# Patient Record
Sex: Male | Born: 1965 | Race: White | Hispanic: No | Marital: Married | Smoking: Former smoker
Health system: Southern US, Community
[De-identification: ages and names within clinical notes are randomized; demographics above are authoritative.]

## PROBLEM LIST (undated history)

## (undated) DIAGNOSIS — E7800 Pure hypercholesterolemia, unspecified: Secondary | ICD-10-CM

## (undated) DIAGNOSIS — E291 Testicular hypofunction: Secondary | ICD-10-CM

## (undated) DIAGNOSIS — M1990 Unspecified osteoarthritis, unspecified site: Secondary | ICD-10-CM

## (undated) DIAGNOSIS — Z86010 Personal history of colonic polyps: Principal | ICD-10-CM

## (undated) DIAGNOSIS — G709 Myoneural disorder, unspecified: Secondary | ICD-10-CM

## (undated) DIAGNOSIS — R7309 Other abnormal glucose: Secondary | ICD-10-CM

## (undated) DIAGNOSIS — G4730 Sleep apnea, unspecified: Secondary | ICD-10-CM

## (undated) DIAGNOSIS — I10 Essential (primary) hypertension: Secondary | ICD-10-CM

## (undated) DIAGNOSIS — E559 Vitamin D deficiency, unspecified: Secondary | ICD-10-CM

## (undated) DIAGNOSIS — L0390 Cellulitis, unspecified: Secondary | ICD-10-CM

## (undated) DIAGNOSIS — E785 Hyperlipidemia, unspecified: Secondary | ICD-10-CM

## (undated) DIAGNOSIS — T7840XA Allergy, unspecified, initial encounter: Secondary | ICD-10-CM

## (undated) HISTORY — DX: Hyperlipidemia, unspecified: E78.5

## (undated) HISTORY — DX: Cellulitis, unspecified: L03.90

## (undated) HISTORY — DX: Essential (primary) hypertension: I10

## (undated) HISTORY — DX: Personal history of colonic polyps: Z86.010

## (undated) HISTORY — DX: Vitamin D deficiency, unspecified: E55.9

## (undated) HISTORY — DX: Pure hypercholesterolemia, unspecified: E78.00

## (undated) HISTORY — DX: Unspecified osteoarthritis, unspecified site: M19.90

## (undated) HISTORY — DX: Testicular hypofunction: E29.1

## (undated) HISTORY — DX: Allergy, unspecified, initial encounter: T78.40XA

## (undated) HISTORY — DX: Other abnormal glucose: R73.09

## (undated) HISTORY — PX: OTHER SURGICAL HISTORY: SHX169

## (undated) HISTORY — PX: COLONOSCOPY: SHX174

## (undated) HISTORY — PX: POLYPECTOMY: SHX149

## (undated) HISTORY — DX: Myoneural disorder, unspecified: G70.9

## (undated) HISTORY — DX: Sleep apnea, unspecified: G47.30

---

## 1984-09-20 HISTORY — PX: HIP FRACTURE SURGERY: SHX118

## 2000-07-29 ENCOUNTER — Encounter: Payer: Self-pay | Admitting: Occupational Medicine

## 2000-07-29 ENCOUNTER — Encounter: Admission: RE | Admit: 2000-07-29 | Discharge: 2000-07-29 | Payer: Self-pay | Admitting: Occupational Medicine

## 2000-08-10 ENCOUNTER — Encounter: Admission: RE | Admit: 2000-08-10 | Discharge: 2000-08-10 | Payer: Self-pay | Admitting: Occupational Medicine

## 2000-08-10 ENCOUNTER — Encounter: Payer: Self-pay | Admitting: Occupational Medicine

## 2009-04-04 ENCOUNTER — Ambulatory Visit (HOSPITAL_COMMUNITY): Admission: RE | Admit: 2009-04-04 | Discharge: 2009-04-04 | Payer: Self-pay | Admitting: Internal Medicine

## 2011-08-30 ENCOUNTER — Ambulatory Visit
Admission: RE | Admit: 2011-08-30 | Discharge: 2011-08-30 | Disposition: A | Discharge status: Home / Self Care | Payer: Worker's Compensation | Source: Ambulatory Visit | Attending: Neurology | Admitting: Neurology

## 2011-08-30 ENCOUNTER — Other Ambulatory Visit: Payer: Self-pay | Admitting: Neurology

## 2011-08-30 ENCOUNTER — Ambulatory Visit
Admission: RE | Admit: 2011-08-30 | Discharge: 2011-08-30 | Disposition: A | Discharge status: Home / Self Care | Payer: Self-pay | Source: Ambulatory Visit | Attending: Neurology | Admitting: Neurology

## 2011-08-30 DIAGNOSIS — M542 Cervicalgia: Secondary | ICD-10-CM

## 2012-03-03 ENCOUNTER — Other Ambulatory Visit: Payer: Self-pay | Admitting: Neurology

## 2012-03-03 DIAGNOSIS — M542 Cervicalgia: Secondary | ICD-10-CM

## 2012-03-15 ENCOUNTER — Ambulatory Visit
Admission: RE | Admit: 2012-03-15 | Discharge: 2012-03-15 | Disposition: A | Discharge status: Home / Self Care | Payer: Worker's Compensation | Source: Ambulatory Visit | Attending: Neurology | Admitting: Neurology

## 2012-03-15 ENCOUNTER — Other Ambulatory Visit: Payer: Self-pay | Admitting: Neurology

## 2012-03-15 DIAGNOSIS — M542 Cervicalgia: Secondary | ICD-10-CM

## 2012-03-15 MED ORDER — DEXAMETHASONE SODIUM PHOSPHATE 4 MG/ML IJ SOLN
8.0000 mg | Freq: Once | INTRAMUSCULAR | Status: AC
  Administered 2012-03-15: 8 mg via INTRA_ARTICULAR

## 2012-03-15 NOTE — Discharge Instructions (Signed)

## 2012-07-18 ENCOUNTER — Other Ambulatory Visit: Payer: Self-pay | Admitting: Neurology

## 2012-07-18 DIAGNOSIS — M542 Cervicalgia: Secondary | ICD-10-CM

## 2012-08-01 ENCOUNTER — Other Ambulatory Visit: Payer: Self-pay | Admitting: Neurology

## 2012-08-01 ENCOUNTER — Ambulatory Visit
Admission: RE | Admit: 2012-08-01 | Discharge: 2012-08-01 | Disposition: A | Discharge status: Home / Self Care | Payer: Worker's Compensation | Source: Ambulatory Visit | Attending: Neurology | Admitting: Neurology

## 2012-08-01 DIAGNOSIS — M542 Cervicalgia: Secondary | ICD-10-CM

## 2012-08-01 MED ORDER — DEXAMETHASONE SODIUM PHOSPHATE 4 MG/ML IJ SOLN
4.0000 mg | Freq: Once | INTRAMUSCULAR | Status: AC
  Administered 2012-08-01: 4 mg via INTRA_ARTICULAR

## 2012-08-01 MED ORDER — IOHEXOL 300 MG/ML  SOLN
1.0000 mL | Freq: Once | INTRAMUSCULAR | Status: AC | PRN
  Administered 2012-08-01: 1 mL via INTRA_ARTICULAR

## 2013-07-31 ENCOUNTER — Other Ambulatory Visit: Payer: Self-pay | Admitting: Internal Medicine

## 2013-08-13 ENCOUNTER — Encounter: Payer: Self-pay | Admitting: Physician Assistant

## 2013-08-13 ENCOUNTER — Ambulatory Visit: Payer: BC Managed Care – PPO | Admitting: Physician Assistant

## 2013-08-13 VITALS — BP 138/88 | HR 72 | Temp 98.6°F | Resp 16 | Wt 275.0 lb

## 2013-08-13 DIAGNOSIS — M25562 Pain in left knee: Secondary | ICD-10-CM

## 2013-08-13 MED ORDER — PREDNISONE 20 MG PO TABS: ORAL_TABLET | ORAL | Status: DC

## 2013-08-13 NOTE — Progress Notes (Signed)
  Subjective:    Patient ID: Donald Schwartz, male    DOB: [birth year], 47 y.o.   MRN: [id number]  Was going down the stairs at a business in [place], missed the step and thinks he hyperextended his left knee and pulled something in his thigh.  Leg Pain  The injury mechanism was a fall. The pain is present in the left leg and left thigh. The quality of the pain is described as aching. Pertinent negatives include no inability to bear weight, loss of motion, loss of sensation, muscle weakness, numbness or tingling. He reports no foreign bodies present. The symptoms are aggravated by movement and weight bearing. He has tried NSAIDs for the symptoms. The treatment provided no relief.    No past medical history on file. Current Outpatient Prescriptions on File Prior to Visit  Medication Sig Dispense Refill  . testosterone cypionate (DEPOTESTOTERONE CYPIONATE) 200 MG/ML injection INJECT TWO MLS (CC) INTRAMUSCULARLY EVERY 2 WEEKS  10 mL  1   No current facility-administered medications on file prior to visit.    Review of Systems  Constitutional: Negative.   HENT: Negative.   Eyes: Negative.   Respiratory: Negative.   Cardiovascular: Negative.   Gastrointestinal: Negative.   Genitourinary: Negative.   Musculoskeletal: Positive for arthralgias, gait problem and myalgias.  Skin: Negative.   Neurological: Negative for tingling and numbness.       Objective:   Physical Exam  Musculoskeletal:       Left knee: He exhibits effusion and abnormal patellar mobility. He exhibits no ecchymosis, no erythema, no LCL laxity, normal meniscus and no MCL laxity. Tenderness found. Patellar tendon tenderness noted. No medial joint line, no lateral joint line, no MCL and no LCL tenderness noted.  +minimal effusion left knee, swelling and tenderness on medial left quads at knee. Pain with flexion and extension of his knee, no laxity in MCL, ACL, Or LCL.        Assessment & Plan:  1. Pain in joint,  lower leg, left- tendoitis versus rupture/tear of medial quad Prednisone 20 #20 - Ambulatory referral to Orthopedic Surgery- able to see him today

## 2013-08-13 NOTE — Patient Instructions (Signed)
Tendon Injury °Tendons are strong, cordlike structures that connect muscle to bone. Tendons are made up of woven fibers, like a rope. A tendon injury is a tear (rupture) of the tendon. The rupture may be partial (only a few of the fibers in your tendon rupture) or complete (your entire tendon ruptures). °CAUSES  °Tendon injuries can be caused by high-stress activities, such as sports. They also can be caused by a repetitive injury or by a single injury from an excessive, rapid force. °SYMPTOMS  °Symptoms of tendon injury include pain when you move the joint close to the tendon. Other symptoms are swelling, redness, and warmth. °DIAGNOSIS  °Tendon injuries often can be diagnosed by physical exam. However, sometimes an X-ray exam or advanced imaging, such as magnetic resonance imaging (MRI), is necessary to determine the extent of the injury. °TREATMENT  °Partial tendon ruptures often can be treated with immobilization. A splint, bandage, or removable brace usually is used to immobilize the injured tendon. Most injured tendons need to be immobilized for 1 2 months before they are completely healed. Complete tendon ruptures may require surgical reattachment. °Document Released: 10/14/2004 Document Revised: 08/26/2011 Document Reviewed: 11/28/2011 °[employer]® Patient Information ©2014 [employer]. ° °

## 2013-09-23 ENCOUNTER — Other Ambulatory Visit: Payer: Self-pay | Admitting: Internal Medicine

## 2013-10-14 ENCOUNTER — Encounter: Payer: Self-pay | Admitting: *Deleted

## 2013-10-18 ENCOUNTER — Encounter: Payer: Self-pay | Admitting: Emergency Medicine

## 2013-10-18 ENCOUNTER — Ambulatory Visit (INDEPENDENT_AMBULATORY_CARE_PROVIDER_SITE_OTHER): Payer: BC Managed Care – PPO | Admitting: Emergency Medicine

## 2013-10-18 VITALS — BP 138/86 | HR 64 | Temp 98.2°F | Resp 18 | Ht 73.5 in | Wt 268.0 lb

## 2013-10-18 DIAGNOSIS — I10 Essential (primary) hypertension: Secondary | ICD-10-CM

## 2013-10-18 DIAGNOSIS — E7800 Pure hypercholesterolemia, unspecified: Secondary | ICD-10-CM

## 2013-10-18 DIAGNOSIS — R7309 Other abnormal glucose: Secondary | ICD-10-CM

## 2013-10-18 DIAGNOSIS — E291 Testicular hypofunction: Secondary | ICD-10-CM

## 2013-10-18 DIAGNOSIS — E782 Mixed hyperlipidemia: Secondary | ICD-10-CM

## 2013-10-18 DIAGNOSIS — R1031 Right lower quadrant pain: Secondary | ICD-10-CM

## 2013-10-18 NOTE — Patient Instructions (Signed)
Kidney Stones Kidney stones (urolithiasis) are solid masses that form inside your kidneys. The intense pain is caused by the stone moving through the kidney, ureter, bladder, and urethra (urinary tract). When the stone moves, the ureter starts to spasm around the stone. The stone is usually passed in your pee (urine).  HOME CARE  Drink enough fluids to keep your pee clear or pale yellow. This helps to get the stone out.  Strain all pee through the provided strainer. Do not pee without peeing through the strainer, not even once. If you pee the stone out, catch it in the strainer. The stone may be as small as a grain of salt. Take this to your doctor. This will help your doctor figure out what you can do to try to prevent more kidney stones.  Only take medicine as told by your doctor.  Follow up with your doctor as told.  Get follow-up X-rays as told by your doctor. GET HELP IF: You have pain that gets worse even if you have been taking pain medicine. GET HELP RIGHT AWAY IF:   Your pain does not get better with medicine.  You have a fever or shaking chills.  Your pain increases and gets worse over 18 hours.  You have new belly (abdominal) pain.  You feel faint or pass out.  You are unable to pee. MAKE SURE YOU:   Understand these instructions.  Will watch your condition.  Will get help right away if you are not doing well or get worse. Document Released: 02/23/2008 Document Revised: 05/09/2013 Document Reviewed: 02/07/2013 [facility] Patient Information 2014 [place], [place]. Diverticulitis Small pockets or "bubbles" can develop in the wall of the intestine. Diverticulitis is when those pockets become infected and inflamed. This causes stomach pain (usually on the left side). HOME CARE  Take all medicine as told by your doctor.  Try a clear liquid diet (broth, tea, or water) for as long as told by your doctor.  Keep all follow-up visits with your doctor.  You may be put on a  low-fiber diet once you start feeling better. Here are foods that have low-fiber:  White breads, cereals, rice, and pasta.  Cooked fruits and vegetables or soft fresh fruits and vegetables without the skin.  Ground or well-cooked tender beef, ham, veal, lamb, pork, or poultry.  Eggs and seafood.  After you are doing well on the low-fiber diet, you may be put on a high-fiber diet. Here are ways to increase your fiber:  Choose whole-grain breads, cereals, pasta, and brown rice.  Choose fruits and vegetables with skin on. Do not overcook the vegetables.  Choose nuts, seeds, legumes, dried peas, beans, and lentils.  Look for food products that have more than 3 grams of fiber per serving on the food label. GET HELP RIGHT AWAY IF:  Your pain does not get better or gets worse.  You have trouble eating food.  You are not pooping (having bowel movements) like normal.  You have a temperature by mouth above 102 F (38.9 C), not controlled by medicine.  You keep throwing up (vomiting).  You have bloody or black, tarry poop (stools).  You are getting worse and not better. MAKE SURE YOU:   Understand these instructions.  Will watch your condition.  Will get help right away if you are not doing well or get worse. Document Released: 02/23/2008 Document Revised: 11/29/2011 Document Reviewed: 07/28/2009 [facility] Patient Information 2014 [place], [place]. Appendicitis Appendicitis means the appendix is puffy (inflamed).  You need to get medical help right away. Without help, your problems can get much worse. The appendix can develop a hole (perforation). A pocket of yellowish-white fluid (abscess) can leak from the appendix. This can make you very sick. SYMPTOMS   Pain around the belly button (navel). The pain later moves toward the lower right belly (abdomen). The pain may be strong and Fix.  Tenderness in the lower right belly. The pain feels worse if you cough or move  suddenly.  Feeling sick to your stomach (nausea).  Throwing up (vomiting).  No desire to eat (loss of appetite).  Fever.  Having a hard time pooping (constipation).  Watery poop (diarrhea).  Generally not feeling well. TREATMENT  In most cases, surgery is done to take out the appendix. This is done as soon as possible. This surgery is called appendectomy. Most people go home in 24 to 48 hours after surgery. If the appendix has a hole, surgery might be delayed. Any yellowish-white fluid will be removed with a drain. A drain removes fluid from the body. You may be given antibiotic medicine that kills germs. This medicine is given through a tube in your vein (IV). You may still need surgery after the fluid has been drained. You may need to stay in the hospital longer than 48 hours. Document Released: 11/29/2011 Document Reviewed: 11/29/2011 [facility] Patient Information 2014 [place].

## 2013-10-18 NOTE — Progress Notes (Signed)
Subjective:    Patient ID: Donald Schwartz, male    DOB: Jun 03, 1966, 48 y.o.   MRN: [id number]  HPI Comments: 48 yo male presents for 3 month F/U for HTN, Cholesterol, Pre-Dm, D. Deficient. He notes BP 120-30/ 80s LAST LABS T 140 TG 111 H 33 L 85 A1C 5.8 D90 INSULIN 21 He has not been exercising routinely but keeps busy. He eats descent.   He has been having low abdomen pain mostly on right side on/ off over the last month. He notes he thinks he has polyps and diverticulitis per BE several years ago. He denies any triggers for pain and notes resolves on it's own. He denies any bowel or urine changes. He notes FHX colon cancer in MGF/ MU x 2  Hypertension   Current Outpatient Prescriptions on File Prior to Visit  Medication Sig Dispense Refill  . aspirin 81 MG chewable tablet Chew by mouth daily.      Marland Kitchen diltiazem (DILACOR XR) 120 MG 24 hr capsule Take 120 mg by mouth daily.      . fluticasone (FLONASE) 50 MCG/ACT nasal spray USE ONE SPRAY EACH NOSTRIL TWICE DAILY  16 g  3  . isometheptene-acetaminophen-dichloralphenazone (MIDRIN) 65-325-100 MG capsule Take 1 capsule by mouth 4 (four) times daily as needed for migraine. Maximum 5 capsules in 12 hours for migraine headaches, 8 capsules in 24 hours for tension headaches.      Marland Kitchen lisinopril (PRINIVIL,ZESTRIL) 10 MG tablet Take 10 mg by mouth daily.      . pregabalin (LYRICA) 100 MG capsule Take 100 mg by mouth 3 (three) times daily.      . Red Yeast Rice Extract (RED YEAST RICE PO) Take 1,200 mg by mouth 2 (two) times daily.       Marland Kitchen testosterone cypionate (DEPOTESTOTERONE CYPIONATE) 200 MG/ML injection INJECT TWO MLS (CC) INTRAMUSCULARLY EVERY 2 WEEKS  10 mL  1  . VITAMIN D, ERGOCALCIFEROL, PO Take 5,000 Int'l Units/day by mouth.       No current facility-administered medications on file prior to visit.   ALLERGIES Toprol xl  Past Medical History  Diagnosis Date  . Hypertension   . Vitamin D deficiency   . Hypogonadism male   . Elevated  LDL cholesterol level   . Elevated hemoglobin A1c        Review of Systems  Gastrointestinal: Positive for abdominal pain.  All other systems reviewed and are negative.   BP 138/86  Pulse 64  Temp(Src) 98.2 F (36.8 C) (Temporal)  Resp 18  Ht 6' 1.5" (1.867 m)  Wt 268 lb (121.564 kg)  BMI 34.88 kg/m2     Objective:   Physical Exam  Nursing note and vitals reviewed. Constitutional: He is oriented to person, place, and time. He appears well-developed and well-nourished.  HENT:  Head: Normocephalic and atraumatic.  Right Ear: External ear normal.  Left Ear: External ear normal.  Nose: Nose normal.  Eyes: Conjunctivae and EOM are normal.  Neck: Normal range of motion. Neck supple. No JVD present. No thyromegaly present.  Cardiovascular: Normal rate, regular rhythm, normal heart sounds and intact distal pulses.   Pulmonary/Chest: Effort normal and breath sounds normal.  Abdominal: Soft. Bowel sounds are normal. He exhibits no distension and no mass. There is no tenderness. There is no rebound and no guarding.  Musculoskeletal: Normal range of motion. He exhibits no edema and no tenderness.  Lymphadenopathy:    He has no cervical adenopathy.  Neurological: He is  alert and oriented to person, place, and time. He has normal reflexes. No cranial nerve deficit. Coordination normal.  Skin: Skin is warm and dry.  Psychiatric: He has a normal mood and affect. His behavior is normal. Judgment and thought content normal.          Assessment & Plan:  1.  3 month F/U for HTN, Cholesterol, Pre-Dm, D. Deficient. Needs healthy diet, cardio QD and obtain healthy weight. Check Labs, Check BP if >130/80 call office 2. Abdomen pain recently- ? Diverticulitis vs kidney stone- Check labs, w/c if SX increase or ER. If symptoms continue may need colonoscopy early with FMhx Colon Cancer

## 2013-10-19 LAB — BASIC METABOLIC PANEL WITHOUT GFR
BUN: 10 mg/dL (ref 6–23)
CO2: 26 meq/L (ref 19–32)
Calcium: 9.5 mg/dL (ref 8.4–10.5)
Chloride: 99 meq/L (ref 96–112)
Creat: 1.13 mg/dL (ref 0.50–1.35)
GFR, Est African American: 88 mL/min
GFR, Est Non African American: 76 mL/min
Glucose, Bld: 91 mg/dL (ref 70–99)
Potassium: 3.9 meq/L (ref 3.5–5.3)
Sodium: 135 meq/L (ref 135–145)

## 2013-10-19 LAB — HEPATIC FUNCTION PANEL
ALT: 32 U/L (ref 0–53)
AST: 36 U/L (ref 0–37)
Albumin: 4.7 g/dL (ref 3.5–5.2)
Alkaline Phosphatase: 42 U/L (ref 39–117)
Bilirubin, Direct: 0.2 mg/dL (ref 0.0–0.3)
Indirect Bilirubin: 0.6 mg/dL (ref 0.2–1.2)
Total Bilirubin: 0.8 mg/dL (ref 0.2–1.2)
Total Protein: 7.6 g/dL (ref 6.0–8.3)

## 2013-10-19 LAB — CBC WITH DIFFERENTIAL/PLATELET
BASOS PCT: 1 % (ref 0–1)
Basophils Absolute: 0.1 10*3/uL (ref 0.0–0.1)
Eosinophils Absolute: 0.2 10*3/uL (ref 0.0–0.7)
Eosinophils Relative: 3 % (ref 0–5)
HEMATOCRIT: 45.8 % (ref 39.0–52.0)
HEMOGLOBIN: 15.9 g/dL (ref 13.0–17.0)
LYMPHS ABS: 2.2 10*3/uL (ref 0.7–4.0)
LYMPHS PCT: 29 % (ref 12–46)
MCH: 29.4 pg (ref 26.0–34.0)
MCHC: 34.7 g/dL (ref 30.0–36.0)
MCV: 84.7 fL (ref 78.0–100.0)
MONO ABS: 1 10*3/uL (ref 0.1–1.0)
MONOS PCT: 13 % — AB (ref 3–12)
NEUTROS ABS: 4.2 10*3/uL (ref 1.7–7.7)
NEUTROS PCT: 54 % (ref 43–77)
Platelets: 268 10*3/uL (ref 150–400)
RBC: 5.41 MIL/uL (ref 4.22–5.81)
RDW: 14.4 % (ref 11.5–15.5)
WBC: 7.6 10*3/uL (ref 4.0–10.5)

## 2013-10-19 LAB — LIPID PANEL
Cholesterol: 153 mg/dL (ref 0–200)
HDL: 40 mg/dL
LDL Cholesterol: 94 mg/dL (ref 0–99)
Total CHOL/HDL Ratio: 3.8 ratio
Triglycerides: 97 mg/dL
VLDL: 19 mg/dL (ref 0–40)

## 2013-10-19 LAB — HEMOGLOBIN A1C
HEMOGLOBIN A1C: 5.8 % — AB (ref <5.7)
MEAN PLASMA GLUCOSE: 120 mg/dL — AB (ref <117)

## 2013-10-19 LAB — INSULIN, FASTING: Insulin fasting, serum: 23 u[IU]/mL (ref 3–28)

## 2013-10-21 DIAGNOSIS — E785 Hyperlipidemia, unspecified: Secondary | ICD-10-CM | POA: Insufficient documentation

## 2013-10-21 DIAGNOSIS — I10 Essential (primary) hypertension: Secondary | ICD-10-CM | POA: Insufficient documentation

## 2013-10-21 DIAGNOSIS — E291 Testicular hypofunction: Secondary | ICD-10-CM | POA: Insufficient documentation

## 2013-10-21 DIAGNOSIS — R7309 Other abnormal glucose: Secondary | ICD-10-CM | POA: Insufficient documentation

## 2014-01-06 DIAGNOSIS — E559 Vitamin D deficiency, unspecified: Secondary | ICD-10-CM | POA: Insufficient documentation

## 2014-01-06 NOTE — Progress Notes (Signed)
Patient ID: KEVAUGHN EWING, male   DOB: 02/02/1966, 48 y.o.   MRN: [id number]   Annual Screening Comprehensive Examination  This very nice 48 y.o.  MWM presents for complete physical.  Patient has been followed for HTN, Diabetes  Prediabetes, Hyperlipidemia, and Vitamin D Deficiency.   HTN predates since  2002. Patient's BP has been controlled at home.Today's BP: 140/78 mmHg. Patient denies any cardiac symptoms as chest pain, palpitations, shortness of breath, dizziness or ankle swelling.   Patient's hyperlipidemia is controlled with diet and medications. Patient denies myalgias or other medication SE's. Last cholesterol last visit was 153, triglycerides 97, HDL 40 and LDL 95 in Jan 2015 - all at goal.     Patient has prediabetes since 2008 with A!c 6.3%  with last A1c was 5.8% in Jan 2015. Patient denies reactive hypoglycemic symptoms, visual blurring, diabetic polys, or paresthesias.     The patient has Testosterone Deficiency and is on Injections at home with an improved sense of well being on therapy. Finally, patient has history of Vitamin D Deficiency of 31 in 2008 with last vitamin D of 90 in Oct 2014.   Medication Sig  . aspirin 81 MG chewable tablet Chew by mouth daily.  . celecoxib (CELEBREX) 200 MG  Take 200 mg by mouth daily as needed.  . diltiazem (DILACOR XR) 120 MG  Take 120 mg by mouth daily.  . fluticasone (FLONASE) 50 MCG/ACT nasal spray USE ONE SPRAY EACH NOSTRIL TWICE DAILY  . MIDRIN 65-325-100 MG capsule Take 1 capsule by mouth 4 (four) times daily as needed for migraine.   Marland Kitchen lisinopril (PRINIVIL,ZESTRIL) 10 MG  Take 10 mg by mouth daily.  . Magnesium 250 MG TABS Take 250 mg by mouth daily.  . Omega-3 Fatty Acids (FISH OIL) 1000 MG CAPS Take by mouth 4 (four) times daily.  . pregabalin (LYRICA) 100 MG capsule Take 100 mg by mouth 3 (three) times daily.  . Red Yeast Rice Extract Take 1,200 mg by mouth 2 (two) times daily.   .  (DEPOTESTOTERONE CYPIONATE) 200 MG/ML   INJECT TWO MLS (CC) INTRAMUSCULARLY EVERY 2 WEEKS  . VITAMIN D, ERGOCALCIFEROL, PO Take 5,000 Int'l Units/day by mouth.    Allergies  Allergen Reactions  . Toprol Xl [Metoprolol Tartrate]     ED   Past Medical History  Diagnosis Date  . Hypertension   . Vitamin D deficiency   . Hypogonadism male   . Elevated LDL cholesterol level   . Elevated hemoglobin A1c    Past Surgical History  Procedure Laterality Date  . Hip fracture surgery Right 1986    avulsion/chip fracture ?stress fracture  . Patella fracture surgery Right 1997    History   Social History  . Marital Status: Married    Spouse Name: N/A    Number of Children: N/A  . Years of Education: N/A   Occupational History  . 15 & 1/2 years for [employer]   Social History Main Topics  . Smoking status: Former Smoker    Quit date: 09/20/1989  . Smokeless tobacco: Not on file  . Alcohol Use: Not on file  . Drug Use: Not on file  . Sexual Activity: Not on file     ROS Constitutional: Denies fever, chills, weight loss/gain, headaches, insomnia, fatigue, night sweats, and change in appetite. Eyes: Denies redness, blurred vision, diplopia, discharge, itchy, watery eyes.  ENT: Denies discharge, congestion, post nasal drip, epistaxis, sore throat, earache, hearing loss, dental  pain, Tinnitus, Vertigo, Sinus pain, snoring.  Cardio: Denies chest pain, palpitations, irregular heartbeat, syncope, dyspnea, diaphoresis, orthopnea, PND, claudication, edema Respiratory: denies cough, dyspnea, DOE, pleurisy, hoarseness, laryngitis, wheezing.  Gastrointestinal: Denies dysphagia, heartburn, reflux, water brash, pain, cramps, nausea, vomiting, bloating, diarrhea, constipation, hematemesis, melena, hematochezia, jaundice, hemorrhoids Genitourinary: Denies dysuria, frequency, urgency, nocturia, hesitancy, discharge, hematuria, flank pain Musculoskeletal: Denies arthralgia, myalgia, stiffness, Jt. Swelling, pain, limp, and  strain/sprain. Skin: Denies puritis, rash, hives, warts, acne, eczema, changing in skin lesion Neuro: No weakness, tremor, incoordination, spasms, paresthesia, pain Psychiatric: Denies confusion, memory loss, sensory loss Endocrine: Denies change in weight, skin, hair change, nocturia, and paresthesia, diabetic polys, visual blurring, hyper / hypo glycemic episodes.  Heme/Lymph: No excessive bleeding, bruising, or elarged lymph nodes.  Physical Exam  BP 140/78  Pulse 64  Temp(Src) 98.1 F (36.7 C) (Temporal)  Resp 16  Ht 6' 1.25" (1.861 m)  Wt 267 lb (121.11 kg)  BMI 34.97 kg/m2  General Appearance: Well nourished, in no apparent distress. Eyes: PERRLA, EOMs, conjunctiva no swelling or erythema, normal fundi and vessels. Sinuses: No frontal/maxillary tenderness ENT/Mouth: EACs patent / TMs  nl. Nares clear without erythema, swelling, mucoid exudates. Oral hygiene is good. No erythema, swelling, or exudate. Tongue normal, non-obstructing. Tonsils not swollen or erythematous. Hearing normal.  Neck: Supple, thyroid normal. No bruits, nodes or JVD. Respiratory: Respiratory effort normal.  BS equal and clear bilateral without rales, rhonci, wheezing or stridor. Cardio: Heart sounds are normal with regular rate and rhythm and no murmurs, rubs or gallops. Peripheral pulses are normal and equal bilaterally without edema. No aortic or femoral bruits. Chest: symmetric with normal excursions and percussion.  Abdomen: Flat, soft, with bowl sounds. Nontender, no guarding, rebound, hernias, masses, or organomegaly.  Lymphatics: Non tender without lymphadenopathy.  Genitourinary: No hernias.Testes nl. DRE - prostate nl for age - smooth & firm w/o nodules. Musculoskeletal: Full ROM all peripheral extremities, joint stability, 5/5 strength, and normal gait. Skin: Warm and dry without rashes, lesions, cyanosis, clubbing or  ecchymosis.  Neuro: Cranial nerves intact, reflexes equal bilaterally. Normal  muscle tone, no cerebellar symptoms. Sensation intact.  Pysch: Awake and oriented X 3, normal affect, insight and judgment appropriate.   Assessment and Plan  1. Annual Screening Examination 2. Hypertension  3. Hyperlipidemia 4. Pre Diabetes 5. Vitamin D Deficiency 6. Testosterone Deficiency  Continue prudent diet as discussed, weight control, BP monitoring, regular exercise, and medications as discussed.  Discussed med effects and SE's. Routine screening labs and tests as requested with regular follow-up as recommended.

## 2014-01-06 NOTE — Patient Instructions (Signed)

## 2014-01-07 ENCOUNTER — Encounter: Payer: Self-pay | Admitting: Internal Medicine

## 2014-01-07 ENCOUNTER — Ambulatory Visit (INDEPENDENT_AMBULATORY_CARE_PROVIDER_SITE_OTHER): Payer: BC Managed Care – PPO | Admitting: Internal Medicine

## 2014-01-07 VITALS — BP 140/78 | HR 64 | Temp 98.1°F | Resp 16 | Ht 73.25 in | Wt 267.0 lb

## 2014-01-07 DIAGNOSIS — E559 Vitamin D deficiency, unspecified: Secondary | ICD-10-CM

## 2014-01-07 DIAGNOSIS — Z111 Encounter for screening for respiratory tuberculosis: Secondary | ICD-10-CM

## 2014-01-07 DIAGNOSIS — Z0000 Encounter for general adult medical examination without abnormal findings: Secondary | ICD-10-CM

## 2014-01-07 DIAGNOSIS — Z1212 Encounter for screening for malignant neoplasm of rectum: Secondary | ICD-10-CM

## 2014-01-07 DIAGNOSIS — Z125 Encounter for screening for malignant neoplasm of prostate: Secondary | ICD-10-CM

## 2014-01-07 DIAGNOSIS — R740 Nonspecific elevation of levels of transaminase and lactic acid dehydrogenase [LDH]: Secondary | ICD-10-CM

## 2014-01-07 DIAGNOSIS — R7401 Elevation of levels of liver transaminase levels: Secondary | ICD-10-CM

## 2014-01-07 DIAGNOSIS — Z113 Encounter for screening for infections with a predominantly sexual mode of transmission: Secondary | ICD-10-CM

## 2014-01-07 DIAGNOSIS — R7309 Other abnormal glucose: Secondary | ICD-10-CM

## 2014-01-07 DIAGNOSIS — I10 Essential (primary) hypertension: Secondary | ICD-10-CM

## 2014-01-07 DIAGNOSIS — R7402 Elevation of levels of lactic acid dehydrogenase (LDH): Secondary | ICD-10-CM

## 2014-01-07 DIAGNOSIS — Z79899 Other long term (current) drug therapy: Secondary | ICD-10-CM

## 2014-01-07 DIAGNOSIS — E7800 Pure hypercholesterolemia, unspecified: Secondary | ICD-10-CM

## 2014-01-07 DIAGNOSIS — Z9989 Dependence on other enabling machines and devices: Secondary | ICD-10-CM

## 2014-01-07 DIAGNOSIS — G4733 Obstructive sleep apnea (adult) (pediatric): Secondary | ICD-10-CM | POA: Insufficient documentation

## 2014-01-07 LAB — CBC WITH DIFFERENTIAL/PLATELET
BASOS ABS: 0 10*3/uL (ref 0.0–0.1)
BASOS PCT: 1 % (ref 0–1)
Eosinophils Absolute: 0.2 10*3/uL (ref 0.0–0.7)
Eosinophils Relative: 4 % (ref 0–5)
HEMATOCRIT: 49.2 % (ref 39.0–52.0)
HEMOGLOBIN: 16.4 g/dL (ref 13.0–17.0)
LYMPHS PCT: 35 % (ref 12–46)
Lymphs Abs: 1.6 10*3/uL (ref 0.7–4.0)
MCH: 28.5 pg (ref 26.0–34.0)
MCHC: 33.3 g/dL (ref 30.0–36.0)
MCV: 85.4 fL (ref 78.0–100.0)
MONOS PCT: 14 % — AB (ref 3–12)
Monocytes Absolute: 0.7 10*3/uL (ref 0.1–1.0)
NEUTROS ABS: 2.2 10*3/uL (ref 1.7–7.7)
NEUTROS PCT: 46 % (ref 43–77)
Platelets: 244 10*3/uL (ref 150–400)
RBC: 5.76 MIL/uL (ref 4.22–5.81)
RDW: 14.3 % (ref 11.5–15.5)
WBC: 4.7 10*3/uL (ref 4.0–10.5)

## 2014-01-08 LAB — LIPID PANEL
CHOL/HDL RATIO: 4.2 ratio
CHOLESTEROL: 139 mg/dL (ref 0–200)
HDL: 33 mg/dL — ABNORMAL LOW (ref >39)
LDL Cholesterol: 83 mg/dL (ref 0–99)
TRIGLYCERIDES: 113 mg/dL (ref <150)
VLDL: 23 mg/dL (ref 0–40)

## 2014-01-08 LAB — MICROALBUMIN / CREATININE URINE RATIO
CREATININE, URINE: 106.2 mg/dL
MICROALB UR: 0.5 mg/dL (ref 0.00–1.89)
MICROALB/CREAT RATIO: 4.7 mg/g (ref 0.0–30.0)

## 2014-01-08 LAB — RPR

## 2014-01-08 LAB — BASIC METABOLIC PANEL WITH GFR
BUN: 12 mg/dL (ref 6–23)
CALCIUM: 8.6 mg/dL (ref 8.4–10.5)
CO2: 25 mEq/L (ref 19–32)
Chloride: 103 mEq/L (ref 96–112)
Creat: 1.06 mg/dL (ref 0.50–1.35)
GFR, EST NON AFRICAN AMERICAN: 82 mL/min
Glucose, Bld: 97 mg/dL (ref 70–99)
Potassium: 4.3 mEq/L (ref 3.5–5.3)
Sodium: 135 mEq/L (ref 135–145)

## 2014-01-08 LAB — HEPATITIS B CORE ANTIBODY, TOTAL: Hep B Core Total Ab: NONREACTIVE

## 2014-01-08 LAB — HEPATIC FUNCTION PANEL
ALT: 28 U/L (ref 0–53)
AST: 32 U/L (ref 0–37)
Albumin: 4.3 g/dL (ref 3.5–5.2)
Alkaline Phosphatase: 42 U/L (ref 39–117)
BILIRUBIN DIRECT: 0.1 mg/dL (ref 0.0–0.3)
BILIRUBIN INDIRECT: 0.5 mg/dL (ref 0.2–1.2)
BILIRUBIN TOTAL: 0.6 mg/dL (ref 0.2–1.2)
Total Protein: 7 g/dL (ref 6.0–8.3)

## 2014-01-08 LAB — MAGNESIUM: Magnesium: 1.9 mg/dL (ref 1.5–2.5)

## 2014-01-08 LAB — INSULIN, FASTING: Insulin fasting, serum: 32 u[IU]/mL — ABNORMAL HIGH (ref 3–28)

## 2014-01-08 LAB — HEMOGLOBIN A1C
Hgb A1c MFr Bld: 5.9 % — ABNORMAL HIGH (ref <5.7)
Mean Plasma Glucose: 123 mg/dL — ABNORMAL HIGH (ref <117)

## 2014-01-08 LAB — VITAMIN B12: VITAMIN B 12: 393 pg/mL (ref 211–911)

## 2014-01-08 LAB — HEPATITIS A ANTIBODY, TOTAL: Hep A Total Ab: NONREACTIVE

## 2014-01-08 LAB — TSH: TSH: 2.179 u[IU]/mL (ref 0.350–4.500)

## 2014-01-08 LAB — PSA: PSA: 0.62 ng/mL (ref <=4.00)

## 2014-01-08 LAB — TESTOSTERONE: TESTOSTERONE: 278 ng/dL — AB (ref 300–890)

## 2014-01-08 LAB — VITAMIN D 25 HYDROXY (VIT D DEFICIENCY, FRACTURES): Vit D, 25-Hydroxy: 89 ng/mL (ref 30–89)

## 2014-01-08 LAB — HEPATITIS C ANTIBODY: HCV Ab: NEGATIVE

## 2014-01-08 LAB — HIV ANTIBODY (ROUTINE TESTING W REFLEX): HIV: NONREACTIVE

## 2014-01-08 LAB — HEPATITIS B SURFACE ANTIBODY,QUALITATIVE: Hep B S Ab: NEGATIVE

## 2014-01-09 LAB — HEPATITIS B E ANTIBODY: HEPATITIS BE ANTIBODY: NONREACTIVE

## 2014-01-10 LAB — TB SKIN TEST
INDURATION: 0 mm
TB SKIN TEST: NEGATIVE

## 2014-02-04 ENCOUNTER — Other Ambulatory Visit: Payer: Self-pay

## 2014-02-04 MED ORDER — DILTIAZEM HCL ER 120 MG PO CP24: 120.0000 mg | ORAL_CAPSULE | Freq: Every day | ORAL | Status: DC

## 2014-02-05 ENCOUNTER — Other Ambulatory Visit: Payer: Self-pay

## 2014-02-05 MED ORDER — DILTIAZEM HCL 120 MG PO TABS: 120.0000 mg | ORAL_TABLET | Freq: Four times a day (QID) | ORAL | Status: DC

## 2014-02-05 MED ORDER — DILTIAZEM HCL 120 MG PO TABS: 120.0000 mg | ORAL_TABLET | Freq: Every day | ORAL | Status: DC

## 2014-02-25 ENCOUNTER — Other Ambulatory Visit: Payer: Self-pay | Admitting: Emergency Medicine

## 2014-04-09 ENCOUNTER — Encounter: Payer: Self-pay | Admitting: Physician Assistant

## 2014-04-09 ENCOUNTER — Ambulatory Visit (INDEPENDENT_AMBULATORY_CARE_PROVIDER_SITE_OTHER): Payer: BC Managed Care – PPO | Admitting: Physician Assistant

## 2014-04-09 VITALS — BP 122/82 | HR 64 | Temp 98.1°F | Resp 16 | Wt 264.0 lb

## 2014-04-09 DIAGNOSIS — R7309 Other abnormal glucose: Secondary | ICD-10-CM

## 2014-04-09 DIAGNOSIS — E7800 Pure hypercholesterolemia, unspecified: Secondary | ICD-10-CM

## 2014-04-09 DIAGNOSIS — Z79899 Other long term (current) drug therapy: Secondary | ICD-10-CM

## 2014-04-09 DIAGNOSIS — E559 Vitamin D deficiency, unspecified: Secondary | ICD-10-CM

## 2014-04-09 DIAGNOSIS — I10 Essential (primary) hypertension: Secondary | ICD-10-CM

## 2014-04-09 LAB — HEPATIC FUNCTION PANEL
ALT: 29 U/L (ref 0–53)
AST: 29 U/L (ref 0–37)
Albumin: 4.6 g/dL (ref 3.5–5.2)
Alkaline Phosphatase: 42 U/L (ref 39–117)
BILIRUBIN DIRECT: 0.1 mg/dL (ref 0.0–0.3)
BILIRUBIN INDIRECT: 0.6 mg/dL (ref 0.2–1.2)
Total Bilirubin: 0.7 mg/dL (ref 0.2–1.2)
Total Protein: 7.5 g/dL (ref 6.0–8.3)

## 2014-04-09 LAB — HEMOGLOBIN A1C
Hgb A1c MFr Bld: 5.9 % — ABNORMAL HIGH (ref <5.7)
MEAN PLASMA GLUCOSE: 123 mg/dL — AB (ref <117)

## 2014-04-09 LAB — CBC WITH DIFFERENTIAL/PLATELET
BASOS PCT: 1 % (ref 0–1)
Basophils Absolute: 0.1 10*3/uL (ref 0.0–0.1)
Eosinophils Absolute: 0.2 10*3/uL (ref 0.0–0.7)
Eosinophils Relative: 4 % (ref 0–5)
HCT: 50.6 % (ref 39.0–52.0)
Hemoglobin: 17.5 g/dL — ABNORMAL HIGH (ref 13.0–17.0)
Lymphocytes Relative: 32 % (ref 12–46)
Lymphs Abs: 1.7 10*3/uL (ref 0.7–4.0)
MCH: 28.8 pg (ref 26.0–34.0)
MCHC: 34.6 g/dL (ref 30.0–36.0)
MCV: 83.4 fL (ref 78.0–100.0)
MONOS PCT: 12 % (ref 3–12)
Monocytes Absolute: 0.6 10*3/uL (ref 0.1–1.0)
NEUTROS ABS: 2.8 10*3/uL (ref 1.7–7.7)
NEUTROS PCT: 51 % (ref 43–77)
PLATELETS: 274 10*3/uL (ref 150–400)
RBC: 6.07 MIL/uL — AB (ref 4.22–5.81)
RDW: 15.4 % (ref 11.5–15.5)
WBC: 5.4 10*3/uL (ref 4.0–10.5)

## 2014-04-09 LAB — LIPID PANEL
CHOL/HDL RATIO: 4.6 ratio
CHOLESTEROL: 173 mg/dL (ref 0–200)
HDL: 38 mg/dL — ABNORMAL LOW (ref >39)
LDL CALC: 107 mg/dL — AB (ref 0–99)
TRIGLYCERIDES: 140 mg/dL (ref <150)
VLDL: 28 mg/dL (ref 0–40)

## 2014-04-09 LAB — BASIC METABOLIC PANEL WITH GFR
BUN: 12 mg/dL (ref 6–23)
CALCIUM: 9.1 mg/dL (ref 8.4–10.5)
CO2: 27 mEq/L (ref 19–32)
Chloride: 100 mEq/L (ref 96–112)
Creat: 1.18 mg/dL (ref 0.50–1.35)
GFR, EST AFRICAN AMERICAN: 83 mL/min
GFR, Est Non African American: 72 mL/min
GLUCOSE: 89 mg/dL (ref 70–99)
POTASSIUM: 4.6 meq/L (ref 3.5–5.3)
SODIUM: 137 meq/L (ref 135–145)

## 2014-04-09 LAB — TSH: TSH: 2.161 u[IU]/mL (ref 0.350–4.500)

## 2014-04-09 LAB — MAGNESIUM: MAGNESIUM: 2 mg/dL (ref 1.5–2.5)

## 2014-04-09 NOTE — Progress Notes (Signed)
Assessment and Plan:  Hypertension: Continue medication, monitor blood pressure at home. Continue DASH diet. Cholesterol: Continue diet and exercise. Check cholesterol.  Pre-diabetes-Continue diet and exercise. Check A1C Vitamin D Def- check level and continue medications.  Hypogonadism- continue replacement therapy, check testosterone levels as needed.    Continue diet and meds as discussed. Further disposition pending results of labs.  HPI 48 y.o. male  presents for 3 month follow up with hypertension, hyperlipidemia, prediabetes and vitamin D. His blood pressure has been controlled at home, today their BP is BP: 122/82 mmHg He does workout. He denies chest pain, shortness of breath, dizziness.  He is not on cholesterol medication and denies myalgias. His cholesterol is at goal. The cholesterol last visit was:   Lab Results  Component Value Date   CHOL 139 01/07/2014   HDL 33* 01/07/2014   LDLCALC 83 01/07/2014   TRIG 113 01/07/2014   CHOLHDL 4.2 01/07/2014   He has been working on diet and exercise for prediabetes, and denies paresthesia of the feet, polydipsia and polyuria. Last A1C in the office was:  Lab Results  Component Value Date   HGBA1C 5.9* 01/07/2014   Patient is on Vitamin D supplement.   Lab Results  Component Value Date   VD25OH 13 01/07/2014     He is has a history of testosterone deficiency and is on testosterone replacement, last shot was Sunday does 2cc every 18 days.  He states that the testosterone helps with his energy, libido, muscle mass. Lab Results  Component Value Date   TESTOSTERONE 278* 01/07/2014   He has been seeing Dr. Latanya Maudlin for left knee pain, has a torn meniscus and has gotten a cortisone infection 3 weeks ago and is starting on a antiinflammatory. The pain has improved.    Current Medications:  Current Outpatient Prescriptions on File Prior to Visit  Medication Sig Dispense Refill  . aspirin 81 MG chewable tablet Chew by mouth daily.      .  celecoxib (CELEBREX) 200 MG capsule Take 200 mg by mouth daily as needed.      . diltiazem (CARDIZEM) 120 MG tablet Take 1 tablet (120 mg total) by mouth daily.  90 tablet  1  . fluticasone (FLONASE) 50 MCG/ACT nasal spray USE ONE SPRAY EACH NOSTRIL TWICE DAILY  16 g  3  . isometheptene-acetaminophen-dichloralphenazone (MIDRIN) 65-325-100 MG capsule Take 1 capsule by mouth 4 (four) times daily as needed for migraine. Maximum 5 capsules in 12 hours for migraine headaches, 8 capsules in 24 hours for tension headaches.      Marland Kitchen lisinopril (PRINIVIL,ZESTRIL) 10 MG tablet Take 10 mg by mouth daily.      . Magnesium 250 MG TABS Take 250 mg by mouth daily.      . Omega-3 Fatty Acids (FISH OIL) 1000 MG CAPS Take by mouth 4 (four) times daily.      . pregabalin (LYRICA) 100 MG capsule Take 100 mg by mouth 3 (three) times daily.      . Red Yeast Rice Extract (RED YEAST RICE PO) Take 1,200 mg by mouth 2 (two) times daily.       Marland Kitchen testosterone cypionate (DEPOTESTOTERONE CYPIONATE) 200 MG/ML injection INJECT 2 ML IM EVERY 2 WEEKS AS DIRECTED  10 mL  0  . VITAMIN D, ERGOCALCIFEROL, PO Take 5,000 Int'l Units/day by mouth.       No current facility-administered medications on file prior to visit.   Medical History:  Past Medical History  Diagnosis Date  .  Hypertension   . Vitamin D deficiency   . Hypogonadism male   . Elevated LDL cholesterol level   . Elevated hemoglobin A1c    Allergies:  Allergies  Allergen Reactions  . Toprol Xl [Metoprolol Tartrate]     ED     Review of Systems: [X]  = complains of  [ ]  = denies  General: Fatigue [ ]  Fever [ ]  Chills [ ]  Weakness [ ]   Insomnia [ ]  Eyes: Redness [ ]  Blurred vision [ ]  Diplopia [ ]   ENT: Congestion [ ]  Sinus Pain [ ]  Post Nasal Drip [ ]  Sore Throat [ ]  Earache [ ]   Cardiac: Chest pain/pressure [ ]  SOB [ ]  Orthopnea [ ]   Palpitations [ ]   Paroxysmal nocturnal dyspnea[ ]  Claudication [ ]  Edema [ ]   Pulmonary: Cough [ ]  Wheezing[ ]   SOB [ ]   Snoring  [ ]   GI: Nausea [ ]  Vomiting[ ]  Dysphagia[ ]  Heartburn[ ]  Abdominal pain [ ]  Constipation [ ] ; Diarrhea [ ] ; BRBPR [ ]  Melena[ ]  GU: Hematuria[ ]  Dysuria [ ]  Nocturia[ ]  Urgency [ ]   Hesitancy [ ]  Discharge [ ]  Neuro: Headaches[ ]  Vertigo[ ]  Paresthesias[ ]  Spasm [ ]  Speech changes [ ]  Incoordination [ ]   Ortho: Arthritis Valu.Nieves ] Joint pain [ X] Muscle pain [ ]  Joint swelling [ ]  Back Pain [ ]  Skin:  Rash [ ]   Pruritis [ ]  Change in skin lesion [ ]   Psych: Depression[ ]  Anxiety[ ]  Confusion [ ]  Memory loss [ ]   Heme/Lypmh: Bleeding [ ]  Bruising [ ]  Enlarged lymph nodes [ ]   Endocrine: Visual blurring [ ]  Paresthesia [ ]  Polyuria [ ]  Polydypsea [ ]    Heat/cold intolerance [ ]  Hypoglycemia [ ]   Family history- Review and unchanged Social history- Review and unchanged Physical Exam: BP 122/82  Pulse 64  Temp(Src) 98.1 F (36.7 C)  Resp 16  Wt 264 lb (119.75 kg) Wt Readings from Last 3 Encounters:  04/09/14 264 lb (119.75 kg)  01/07/14 267 lb (121.11 kg)  10/18/13 268 lb (121.564 kg)   General Appearance: Well nourished, in no apparent distress. Eyes: PERRLA, EOMs, conjunctiva no swelling or erythema Sinuses: No Frontal/maxillary tenderness ENT/Mouth: Ext aud canals clear, TMs without erythema, bulging. No erythema, swelling, or exudate on post pharynx.  Tonsils not swollen or erythematous. Hearing normal.  Neck: Supple, thyroid normal.  Respiratory: Respiratory effort normal, BS equal bilaterally without rales, rhonchi, wheezing or stridor.  Cardio: RRR with no MRGs. Brisk peripheral pulses without edema.  Abdomen: Soft, + BS.  Non tender, no guarding, rebound, hernias, masses. Lymphatics: Non tender without lymphadenopathy.  Musculoskeletal: Full ROM, 5/5 strength, normal gait.  Skin: Warm, dry without rashes, lesions, ecchymosis.  Neuro: Cranial nerves intact. Normal muscle tone, no cerebellar symptoms. Sensation intact.  Psych: Awake and oriented X 3, normal affect, Insight and  Judgment appropriate.    Vicie Mutters 8:40 AM

## 2014-04-09 NOTE — Patient Instructions (Signed)

## 2014-04-10 LAB — INSULIN, FASTING: Insulin fasting, serum: 28 u[IU]/mL (ref 3–28)

## 2014-04-10 LAB — VITAMIN D 25 HYDROXY (VIT D DEFICIENCY, FRACTURES): VIT D 25 HYDROXY: 96 ng/mL — AB (ref 30–89)

## 2014-05-22 ENCOUNTER — Other Ambulatory Visit: Payer: Self-pay | Admitting: Emergency Medicine

## 2014-07-10 ENCOUNTER — Ambulatory Visit (INDEPENDENT_AMBULATORY_CARE_PROVIDER_SITE_OTHER): Payer: BC Managed Care – PPO | Admitting: Physician Assistant

## 2014-07-10 ENCOUNTER — Encounter: Payer: Self-pay | Admitting: Internal Medicine

## 2014-07-10 VITALS — BP 148/86 | HR 72 | Temp 98.8°F | Resp 16 | Ht 73.5 in | Wt 269.8 lb

## 2014-07-10 DIAGNOSIS — I10 Essential (primary) hypertension: Secondary | ICD-10-CM

## 2014-07-10 DIAGNOSIS — E7800 Pure hypercholesterolemia, unspecified: Secondary | ICD-10-CM

## 2014-07-10 DIAGNOSIS — Z79899 Other long term (current) drug therapy: Secondary | ICD-10-CM

## 2014-07-10 DIAGNOSIS — E291 Testicular hypofunction: Secondary | ICD-10-CM

## 2014-07-10 DIAGNOSIS — Z23 Encounter for immunization: Secondary | ICD-10-CM

## 2014-07-10 DIAGNOSIS — R7309 Other abnormal glucose: Secondary | ICD-10-CM

## 2014-07-10 DIAGNOSIS — E559 Vitamin D deficiency, unspecified: Secondary | ICD-10-CM

## 2014-07-10 LAB — CBC WITH DIFFERENTIAL/PLATELET
BASOS PCT: 0 % (ref 0–1)
Basophils Absolute: 0 10*3/uL (ref 0.0–0.1)
Eosinophils Absolute: 0.2 10*3/uL (ref 0.0–0.7)
Eosinophils Relative: 3 % (ref 0–5)
HEMATOCRIT: 47.2 % (ref 39.0–52.0)
Hemoglobin: 16.5 g/dL (ref 13.0–17.0)
LYMPHS PCT: 35 % (ref 12–46)
Lymphs Abs: 2.3 10*3/uL (ref 0.7–4.0)
MCH: 29.6 pg (ref 26.0–34.0)
MCHC: 35 g/dL (ref 30.0–36.0)
MCV: 84.6 fL (ref 78.0–100.0)
Monocytes Absolute: 0.7 10*3/uL (ref 0.1–1.0)
Monocytes Relative: 11 % (ref 3–12)
NEUTROS PCT: 51 % (ref 43–77)
Neutro Abs: 3.4 10*3/uL (ref 1.7–7.7)
PLATELETS: 268 10*3/uL (ref 150–400)
RBC: 5.58 MIL/uL (ref 4.22–5.81)
RDW: 14.3 % (ref 11.5–15.5)
WBC: 6.6 10*3/uL (ref 4.0–10.5)

## 2014-07-10 MED ORDER — LISINOPRIL 40 MG PO TABS: ORAL_TABLET | ORAL | Status: DC

## 2014-07-10 NOTE — Patient Instructions (Signed)
Bad carbs also include fruit juice, alcohol, and sweet tea. These are empty calories that do not signal to your brain that you are full.   Please remember the good carbs are still carbs which convert into sugar. So please measure them out no more than 1/2-1 cup of rice, oatmeal, pasta, and beans.  Veggies are however free foods! Pile them on.   I like lean protein at every meal such as chicken, [place], pork chops, cottage cheese, etc. Just do not fry these meats and please center your meal around vegetable, the meats should be a side dish.   No all fruit is created equal. Please see the list below, the fruit at the bottom is higher in sugars than the fruit at the top   Ona stands for "Dietary Approaches to Stop Hypertension." The DASH eating plan is a healthy eating plan that has been shown to reduce high blood pressure (hypertension). Additional health benefits may include reducing the risk of type 2 diabetes mellitus, heart disease, and stroke. The DASH eating plan may also help with weight loss. WHAT DO I NEED TO KNOW ABOUT THE DASH EATING PLAN? For the DASH eating plan, you will follow these general guidelines:  Choose foods with a percent daily value for sodium of less than 5% (as listed on the food label).  Use salt-free seasonings or herbs instead of table salt or sea salt.  Check with your health care provider or pharmacist before using salt substitutes.  Eat lower-sodium products, often labeled as "lower sodium" or "no salt added."  Eat fresh foods.  Eat more vegetables, fruits, and low-fat dairy products.  Choose whole grains. Look for the word "whole" as the first word in the ingredient list.  Choose fish and skinless chicken or [place] more often than red meat. Limit fish, poultry, and meat to 6 oz (170 g) each day.  Limit sweets, desserts, sugars, and sugary drinks.  Choose heart-healthy fats.  Limit cheese to 1 oz (28 g) per day.  Eat more  home-cooked food and less restaurant, buffet, and fast food.  Limit fried foods.  Cook foods using methods other than frying.  Limit canned vegetables. If you do use them, rinse them well to decrease the sodium.  When eating at a restaurant, ask that your food be prepared with less salt, or no salt if possible. WHAT FOODS CAN I EAT? Seek help from a dietitian for individual calorie needs. Grains Whole grain or whole wheat bread. Brown rice. Whole grain or whole wheat pasta. Quinoa, bulgur, and whole grain cereals. Low-sodium cereals. Corn or whole wheat flour tortillas. Whole grain cornbread. Whole grain crackers. Low-sodium crackers. Vegetables Fresh or frozen vegetables (raw, steamed, roasted, or grilled). Low-sodium or reduced-sodium tomato and vegetable juices. Low-sodium or reduced-sodium tomato sauce and paste. Low-sodium or reduced-sodium canned vegetables.  Fruits All fresh, canned (in natural juice), or frozen fruits. Meat and Other Protein Products Ground beef (85% or leaner), grass-fed beef, or beef trimmed of fat. Skinless chicken or [place]. Ground chicken or [place]. Pork trimmed of fat. All fish and seafood. Eggs. Dried beans, peas, or lentils. Unsalted nuts and seeds. Unsalted canned beans. Dairy Low-fat dairy products, such as skim or 1% milk, 2% or reduced-fat cheeses, low-fat ricotta or cottage cheese, or plain low-fat yogurt. Low-sodium or reduced-sodium cheeses. Fats and Oils Tub margarines without trans fats. Light or reduced-fat mayonnaise and salad dressings (reduced sodium). Avocado. Safflower, olive, or canola oils. Natural peanut or almond  butter. Other Unsalted popcorn and pretzels. The items listed above may not be a complete list of recommended foods or beverages. Contact your dietitian for more options. WHAT FOODS ARE NOT RECOMMENDED? Grains White bread. White pasta. White rice. Refined cornbread. Bagels and croissants. Crackers that contain trans  fat. Vegetables Creamed or fried vegetables. Vegetables in a cheese sauce. Regular canned vegetables. Regular canned tomato sauce and paste. Regular tomato and vegetable juices. Fruits Dried fruits. Canned fruit in light or heavy syrup. Fruit juice. Meat and Other Protein Products Fatty cuts of meat. Ribs, chicken wings, bacon, sausage, bologna, salami, chitterlings, fatback, hot dogs, bratwurst, and packaged luncheon meats. Salted nuts and seeds. Canned beans with salt. Dairy Whole or 2% milk, cream, half-and-half, and cream cheese. Whole-fat or sweetened yogurt. Full-fat cheeses or blue cheese. Nondairy creamers and whipped toppings. Processed cheese, cheese spreads, or cheese curds. Condiments Onion and garlic salt, seasoned salt, table salt, and sea salt. Canned and packaged gravies. Worcestershire sauce. Tartar sauce. Barbecue sauce. Teriyaki sauce. Soy sauce, including reduced sodium. Steak sauce. Fish sauce. Oyster sauce. Cocktail sauce. Horseradish. Ketchup and mustard. Meat flavorings and tenderizers. Bouillon cubes. Hot sauce. Tabasco sauce. Marinades. Taco seasonings. Relishes. Fats and Oils Butter, stick margarine, lard, shortening, ghee, and bacon fat. Coconut, palm kernel, or palm oils. Regular salad dressings. Other Pickles and olives. Salted popcorn and pretzels. The items listed above may not be a complete list of foods and beverages to avoid. Contact your dietitian for more information. WHERE CAN I FIND MORE INFORMATION? National Heart, [employer], and [employer] Institute: travelstabloid.com Document Released: 08/26/2011 Document Revised: 01/21/2014 Document Reviewed: 07/11/2013 [facility] Patient Information 2015 [place], [place]. This information is not intended to replace advice given to you by your health care provider. Make sure you discuss any questions you have with your health care provider.

## 2014-07-10 NOTE — Progress Notes (Signed)
Assessment and Plan:  Hypertension: Increase lisinopril to 20mg  daily, monitor blood pressure at home. Continue DASH diet.  Reminder to go to the ER if any CP, SOB, nausea, dizziness, severe HA, changes vision/speech, left arm numbness and tingling, and jaw pain. Cholesterol: Continue diet and exercise. Check cholesterol.  Pre-diabetes-Continue diet and exercise. Check A1C Vitamin D Def- check level and continue medications.  Hypogonadism- continue replacement therapy, check testosterone levels as needed.  Obesity with co morbidities- long discussion about weight loss, diet, and exercise   Continue diet and meds as discussed. Further disposition pending results of labs.  HPI 48 y.o. male  presents for 3 month follow up with hypertension, hyperlipidemia, prediabetes and vitamin D.  His blood pressure has not been checked at home, today their BP is BP: 148/86 mmHg He does not workout due to his knee pain. He denies chest pain, shortness of breath, dizziness.  He is on cholesterol medication and denies myalgias. His cholesterol is at goal. The cholesterol last visit was:   Lab Results  Component Value Date   CHOL 173 04/09/2014   HDL 38* 04/09/2014   LDLCALC 107* 04/09/2014   TRIG 140 04/09/2014   CHOLHDL 4.6 04/09/2014   He has been working on diet and exercise for prediabetes, and denies paresthesia of the feet, polydipsia and polyuria. Last A1C in the office was:  Lab Results  Component Value Date   HGBA1C 5.9* 04/09/2014   Patient is on Vitamin D supplement.   Lab Results  Component Value Date   VD25OH 96* 04/09/2014     BP Readings from Last 3 Encounters:  07/10/14 148/86  04/09/14 122/82  01/07/14 140/78   He continues to have knee pain and is seeing Dr. Latanya Maudlin, he may have to have an MRI, he has been on mobic.  He has a history of testosterone deficiency and is on testosterone replacement, last injection was 06/23/2014, due today. He states that the testosterone helps with his  energy, libido, muscle mass. Lab Results  Component Value Date   TESTOSTERONE 278* 01/07/2014    Current Medications:  Current Outpatient Prescriptions on File Prior to Visit  Medication Sig Dispense Refill  . aspirin 81 MG chewable tablet Chew by mouth daily.      . celecoxib (CELEBREX) 200 MG capsule Take 200 mg by mouth daily as needed.      . diltiazem (CARDIZEM) 120 MG tablet Take 1 tablet (120 mg total) by mouth daily.  90 tablet  1  . fluticasone (FLONASE) 50 MCG/ACT nasal spray USE ONE SPRAY EACH NOSTRIL TWICE DAILY  16 g  3  . isometheptene-acetaminophen-dichloralphenazone (MIDRIN) 65-325-100 MG capsule Take 1 capsule by mouth 4 (four) times daily as needed for migraine. Maximum 5 capsules in 12 hours for migraine headaches, 8 capsules in 24 hours for tension headaches.      Marland Kitchen lisinopril (PRINIVIL,ZESTRIL) 10 MG tablet Take 10 mg by mouth daily.      . Magnesium 250 MG TABS Take 250 mg by mouth daily.      . Omega-3 Fatty Acids (FISH OIL) 1000 MG CAPS Take by mouth 4 (four) times daily.      . Red Yeast Rice Extract (RED YEAST RICE PO) Take 1,200 mg by mouth 2 (two) times daily.       Marland Kitchen testosterone cypionate (DEPOTESTOTERONE CYPIONATE) 200 MG/ML injection INJECT 2 ML IM EVERY 2 WEEKS AS DIRECTED  10 mL  0  . VITAMIN D, ERGOCALCIFEROL, PO Take 5,000 Int'l Units/day  by mouth.       No current facility-administered medications on file prior to visit.   Medical History:  Past Medical History  Diagnosis Date  . Hypertension   . Vitamin D deficiency   . Hypogonadism male   . Elevated LDL cholesterol level   . Elevated hemoglobin A1c    Allergies:  Allergies  Allergen Reactions  . Toprol Xl [Metoprolol Tartrate]     ED     Review of Systems: [X]  = complains of  [ ]  = denies  General: Fatigue [ ]  Fever [ ]  Chills [ ]  Weakness [ ]   Insomnia [ ]  Eyes: Redness [ ]  Blurred vision [ ]  Diplopia [ ]   ENT: Congestion [ ]  Sinus Pain [ ]  Post Nasal Drip [ ]  Sore Throat [ ]  Earache [  ]  Cardiac: Chest pain/pressure [ ]  SOB [ ]  Orthopnea [ ]   Palpitations [ ]   Paroxysmal nocturnal dyspnea[ ]  Claudication [ ]  Edema [ ]   Pulmonary: Cough [ ]  Wheezing[ ]   SOB [ ]   Snoring [ ]   GI: Nausea [ ]  Vomiting[ ]  Dysphagia[ ]  Heartburn[ ]  Abdominal pain [ ]  Constipation [ ] ; Diarrhea [ ] ; BRBPR [ ]  Melena[ ]  GU: Hematuria[ ]  Dysuria [ ]  Nocturia[ ]  Urgency [ ]   Hesitancy [ ]  Discharge [ ]  Neuro: Headaches[ ]  Vertigo[ ]  Paresthesias[ ]  Spasm [ ]  Speech changes [ ]  Incoordination [ ]   Ortho: Arthritis [x ] Joint pain [ ]  Muscle pain [ ]  Joint swelling [ ]  Back Pain [ ]  Skin:  Rash [ ]   Pruritis [ ]  Change in skin lesion [ ]   Psych: Depression[ ]  Anxiety[ ]  Confusion [ ]  Memory loss [ ]   Heme/Lypmh: Bleeding [ ]  Bruising [ ]  Enlarged lymph nodes [ ]   Endocrine: Visual blurring [ ]  Paresthesia [ ]  Polyuria [ ]  Polydypsea [ ]    Heat/cold intolerance [ ]  Hypoglycemia [ ]   Family history- Review and unchanged Social history- Review and unchanged Physical Exam: BP 148/86  Pulse 72  Temp(Src) 98.8 F (37.1 C) (Temporal)  Resp 16  Ht 6' 1.5" (1.867 m)  Wt 269 lb 12.8 oz (122.38 kg)  BMI 35.11 kg/m2 Wt Readings from Last 3 Encounters:  07/10/14 269 lb 12.8 oz (122.38 kg)  04/09/14 264 lb (119.75 kg)  01/07/14 267 lb (121.11 kg)   General Appearance: Well nourished, in no apparent distress. Eyes: PERRLA, EOMs, conjunctiva no swelling or erythema Sinuses: No Frontal/maxillary tenderness ENT/Mouth: Ext aud canals clear, TMs without erythema, bulging. No erythema, swelling, or exudate on post pharynx.  Tonsils not swollen or erythematous. Hearing normal.  Neck: Supple, thyroid normal.  Respiratory: Respiratory effort normal, BS equal bilaterally without rales, rhonchi, wheezing or stridor.  Cardio: RRR with no MRGs. Brisk peripheral pulses without edema.  Abdomen: Soft, + BS.  Non tender, no guarding, rebound, hernias, masses. Lymphatics: Non tender without lymphadenopathy.   Musculoskeletal: Full ROM, 5/5 strength, normal gait.  Skin: Warm, dry without rashes, lesions, ecchymosis.  Neuro: Cranial nerves intact. Normal muscle tone, no cerebellar symptoms. Sensation intact.  Psych: Awake and oriented X 3, normal affect, Insight and Judgment appropriate.    Vicie Mutters, PA-C 5:18 PM [facility]

## 2014-07-11 LAB — HEMOGLOBIN A1C
Hgb A1c MFr Bld: 6 % — ABNORMAL HIGH (ref <5.7)
Mean Plasma Glucose: 126 mg/dL — ABNORMAL HIGH (ref <117)

## 2014-07-11 LAB — TSH: TSH: 2.348 u[IU]/mL (ref 0.350–4.500)

## 2014-07-11 LAB — LIPID PANEL
Cholesterol: 156 mg/dL (ref 0–200)
HDL: 36 mg/dL — AB (ref >39)
LDL CALC: 84 mg/dL (ref 0–99)
TRIGLYCERIDES: 180 mg/dL — AB (ref <150)
Total CHOL/HDL Ratio: 4.3 Ratio
VLDL: 36 mg/dL (ref 0–40)

## 2014-07-11 LAB — BASIC METABOLIC PANEL WITH GFR
BUN: 11 mg/dL (ref 6–23)
CHLORIDE: 100 meq/L (ref 96–112)
CO2: 25 mEq/L (ref 19–32)
Calcium: 9.4 mg/dL (ref 8.4–10.5)
Creat: 1.26 mg/dL (ref 0.50–1.35)
GFR, Est African American: 76 mL/min
GFR, Est Non African American: 66 mL/min
Glucose, Bld: 82 mg/dL (ref 70–99)
POTASSIUM: 4.2 meq/L (ref 3.5–5.3)
SODIUM: 136 meq/L (ref 135–145)

## 2014-07-11 LAB — HEPATIC FUNCTION PANEL
ALK PHOS: 43 U/L (ref 39–117)
ALT: 39 U/L (ref 0–53)
AST: 39 U/L — ABNORMAL HIGH (ref 0–37)
Albumin: 4.5 g/dL (ref 3.5–5.2)
BILIRUBIN INDIRECT: 0.6 mg/dL (ref 0.2–1.2)
BILIRUBIN TOTAL: 0.7 mg/dL (ref 0.2–1.2)
Bilirubin, Direct: 0.1 mg/dL (ref 0.0–0.3)
Total Protein: 7.4 g/dL (ref 6.0–8.3)

## 2014-07-11 LAB — VITAMIN D 25 HYDROXY (VIT D DEFICIENCY, FRACTURES): Vit D, 25-Hydroxy: 102 ng/mL — ABNORMAL HIGH (ref 30–89)

## 2014-07-11 LAB — MAGNESIUM: MAGNESIUM: 2 mg/dL (ref 1.5–2.5)

## 2014-07-11 LAB — INSULIN, FASTING: Insulin fasting, serum: 21 u[IU]/mL — ABNORMAL HIGH (ref 2.0–19.6)

## 2014-07-11 LAB — TESTOSTERONE: TESTOSTERONE: 679 ng/dL (ref 300–890)

## 2014-08-14 ENCOUNTER — Other Ambulatory Visit: Payer: Self-pay | Admitting: Physician Assistant

## 2014-08-14 ENCOUNTER — Encounter: Payer: Self-pay | Admitting: Internal Medicine

## 2014-08-14 DIAGNOSIS — Z1211 Encounter for screening for malignant neoplasm of colon: Secondary | ICD-10-CM

## 2014-08-14 MED ORDER — VALACYCLOVIR HCL 1 G PO TABS: 1000.0000 mg | ORAL_TABLET | Freq: Every day | ORAL | Status: DC

## 2014-08-26 ENCOUNTER — Encounter: Payer: Self-pay | Admitting: Internal Medicine

## 2014-09-05 ENCOUNTER — Other Ambulatory Visit: Payer: Self-pay | Admitting: Internal Medicine

## 2014-09-20 ENCOUNTER — Encounter: Payer: Self-pay | Admitting: *Deleted

## 2014-10-10 ENCOUNTER — Encounter: Payer: Self-pay | Admitting: Physician Assistant

## 2014-10-10 ENCOUNTER — Ambulatory Visit (INDEPENDENT_AMBULATORY_CARE_PROVIDER_SITE_OTHER): Payer: BC Managed Care – PPO | Admitting: Physician Assistant

## 2014-10-10 ENCOUNTER — Other Ambulatory Visit: Payer: Self-pay | Admitting: Physician Assistant

## 2014-10-10 ENCOUNTER — Ambulatory Visit: Payer: Self-pay | Admitting: Emergency Medicine

## 2014-10-10 VITALS — BP 136/84 | HR 84 | Temp 99.7°F | Resp 16 | Ht 73.5 in | Wt 274.4 lb

## 2014-10-10 DIAGNOSIS — E785 Hyperlipidemia, unspecified: Secondary | ICD-10-CM

## 2014-10-10 DIAGNOSIS — Z79899 Other long term (current) drug therapy: Secondary | ICD-10-CM

## 2014-10-10 DIAGNOSIS — E291 Testicular hypofunction: Secondary | ICD-10-CM

## 2014-10-10 DIAGNOSIS — N182 Chronic kidney disease, stage 2 (mild): Secondary | ICD-10-CM

## 2014-10-10 DIAGNOSIS — R1011 Right upper quadrant pain: Secondary | ICD-10-CM

## 2014-10-10 DIAGNOSIS — I10 Essential (primary) hypertension: Secondary | ICD-10-CM

## 2014-10-10 DIAGNOSIS — E559 Vitamin D deficiency, unspecified: Secondary | ICD-10-CM

## 2014-10-10 DIAGNOSIS — R7309 Other abnormal glucose: Secondary | ICD-10-CM

## 2014-10-10 LAB — CBC WITH DIFFERENTIAL/PLATELET
BASOS PCT: 1 % (ref 0–1)
Basophils Absolute: 0.1 10*3/uL (ref 0.0–0.1)
EOS PCT: 5 % (ref 0–5)
Eosinophils Absolute: 0.3 10*3/uL (ref 0.0–0.7)
HCT: 45.3 % (ref 39.0–52.0)
Hemoglobin: 15.3 g/dL (ref 13.0–17.0)
LYMPHS PCT: 34 % (ref 12–46)
Lymphs Abs: 2.2 10*3/uL (ref 0.7–4.0)
MCH: 29.1 pg (ref 26.0–34.0)
MCHC: 33.8 g/dL (ref 30.0–36.0)
MCV: 86.3 fL (ref 78.0–100.0)
MPV: 9.2 fL (ref 8.6–12.4)
Monocytes Absolute: 1.1 10*3/uL — ABNORMAL HIGH (ref 0.1–1.0)
Monocytes Relative: 17 % — ABNORMAL HIGH (ref 3–12)
Neutro Abs: 2.8 10*3/uL (ref 1.7–7.7)
Neutrophils Relative %: 43 % (ref 43–77)
PLATELETS: 294 10*3/uL (ref 150–400)
RBC: 5.25 MIL/uL (ref 4.22–5.81)
RDW: 14.7 % (ref 11.5–15.5)
WBC: 6.5 10*3/uL (ref 4.0–10.5)

## 2014-10-10 NOTE — Patient Instructions (Addendum)
-continue medications as prescribed. -Stay away from fatty foods.  Eat more low carb foods, lots of vegetables and white meat like [place] or chicken.  Please keep appt in April 2016 for physical.  If pain gets worse or nausea, vomiting, diarrhea, constipation, develop fever, chills, sweats, fatigue, shortness of breath or chest pain, then go to ED immediately.     We have made a referral for you to get imaging. We will try to get this as soon as possible but please understand that we often need to get approval with your insurance before we can schedule and not every office can accommodate you quickly. So please allow 5-7 business days for [place] to call you back about the referral.    Abdominal Pain Many things can cause abdominal pain. Usually, abdominal pain is not caused by a disease and will improve without treatment. It can often be observed and treated at home. Your health care provider will do a physical exam and possibly order blood tests and X-rays to help determine the seriousness of your pain. However, in many cases, more time must pass before a clear cause of the pain can be found. Before that point, your health care provider may not know if you need more testing or further treatment. HOME CARE INSTRUCTIONS  Monitor your abdominal pain for any changes. The following actions may help to alleviate any discomfort you are experiencing:  Only take over-the-counter or prescription medicines as directed by your health care provider.  Do not take laxatives unless directed to do so by your health care provider.  Try a clear liquid diet (broth, tea, or water) as directed by your health care provider. Slowly move to a bland diet as tolerated. SEEK MEDICAL CARE IF:  You have unexplained abdominal pain.  You have abdominal pain associated with nausea or diarrhea.  You have pain when you urinate or have a bowel movement.  You experience abdominal pain that wakes you in the night.  You have  abdominal pain that is worsened or improved by eating food.  You have abdominal pain that is worsened with eating fatty foods.  You have a fever. SEEK IMMEDIATE MEDICAL CARE IF:   Your pain does not go away within 2 hours.  You keep throwing up (vomiting).  Your pain is felt only in portions of the abdomen, such as the right side or the left lower portion of the abdomen.  You pass bloody or black tarry stools. MAKE SURE YOU:  Understand these instructions.   Will watch your condition.   Will get help right away if you are not doing well or get worse.  Document Released: 06/16/2005 Document Revised: 09/11/2013 Document Reviewed: 05/16/2013 [facility] Patient Information 2015 [place], [place]. This information is not intended to replace advice given to you by your health care provider. Make sure you discuss any questions you have with your health care provider.      Bad carbs also include fruit juice, alcohol, and sweet tea. These are empty calories that do not signal to your brain that you are full.   Please remember the good carbs are still carbs which convert into sugar. So please measure them out no more than 1/2-1 cup of rice, oatmeal, pasta, and beans  Veggies are however free foods! Pile them on.   Not all fruit is created equal. Please see the list below, the fruit at the bottom is higher in sugars than the fruit at the top. Please avoid all dried fruits.  GIVE PT FOOD CHOICE LISTS FOR MEAL PLANNING  1)The amount of food you eat is important  -Too much can increase glucose levels and cause you to gain weight (which can  also increase glucose levels.  -Too little can decrease glucose level to unsafe levels (<70) 2)Eat meals and snacks at the same time each day to help your diabetes medication help you.  If you eat at different times each day, then the medication will not be as effective. 3)Do NOT skip meals or eat meals later than usual.  If you skip meals, then  your glucose level can go low (<70).  Eating meals later than usual will not help the medication work effectively. 4) Amount of carbohydrates (carbs) per meal  -Breakfast- 30-45 grams of carbs  -Lunch and Dinner- 45-60 grams of carbs  -Snacks- 15-30 grams of carbs 5)Low fat foods have no more than 3 grams of fat per serving.  -Saturated- look for less than 1 gram per serving  -Trans Fat- look for 0 grams per serving 6)Exercise at least 120 minutes per week  Exercise Benefits:   -Lower LDL (Bad cholesterol)   -Lower blood pressure   -Increase HDL (Good cholesterol)   -Strengthen heart, lungs and muscles   -Burn calories and relieve stress   -Sleep better and help you feel better overall  To lower your risk of heart disease, limit your intake of saturated fat and trans fat as much as possible. THE GOOD WHAT IT DOES WHERE IT'S FOUND  MONOUNSATURATED FAT Lowers LDL and maybe raises HDL cholesterol Canola oil, olives, olive oil, peanuts, peanut oil, avocados, nuts  POLYUNSATURATED FAT Lowers LDL cholesterol Corn, safflower, sunflower and soybean oils, nuts, seeds  OMEGA-3 FATTY ACIDS Lowers triglycerides (blood fats) and blood pressure Salmon, mackerel, herring, sardines, flax seed, flaxseed oil, walnuts, soybean oil  THE BAD WHAT IT DOES WHERE IT'S FOUND  SATURATED FAT Raises LDL (bad) cholesterol Butter, shortening, lard, red meat, cheese, whole milk, ice cream, coconut and palm oils  TRANS FAT Raises LDL cholesterol, lowers HDL (good) cholesterol Fried foods, some stick margarines, some cookies and crackers (look for hydrogenated fat on the ingredient list)  CHOLESTEROL FROM FOOD Too much may raise cholesterol levels Meat, poultry, seafood, eggs, milk, cheese, yogurt, butter   Plate Method (How much food of each food group) 1) Fill one half of your plate with nonstarchy vegetables: lettuce, broccoli, green beans, spinach, carrots or peppers. 2) Fill one quarter with protein: chicken,  [place], fish, lean meat, eggs or tofu. 3) Fill one quarter with a nutritious carbohydrate food: brown rice, whole-wheat pasta, whole-wheat bread, peas, or corn.  Choose whole-wheat carbs for extra nutrition.  Controlling carbs helps you control your blood glucose. 4) Include a small piece of fruit at each meal, as well as 8 ounces of lowfat milk or yogurt. 5) Add 1-2 teaspoons of heart-healthy fat, such as olive or canola oil, trans fat-free margarine, avocado, nuts or seeds.

## 2014-10-10 NOTE — Progress Notes (Signed)
HPI A Caucasian 49 y.o. male  presents for 3 month follow up with hypertension, hyperlipidemia, prediabetes and vitamin D.  Last visit was 07/10/14.  Patient does have RUQ abdominal pain that started 3 weeks ago.  His pain comes and goes and radiates to the right upper back/shoulder.  The Schinke pain is 3 out of 10 right now and is worse after meals and at night.  He does report nausea and denies vomiting, diarrhea, constipation, blood in stool and melena.  Patient is on Testosterone IM injections that his wife gives him at home since she is a Marine scientist.  He states he feels better with the injections and denies any complications with the injection at this time.  His blood pressure has been controlled at home, today their BP is BP: 136/84 mmHg.  He has had HTN since 2002.  He is currently taking Cardizem 120mg  and Lisinopril 40mg .  He does workout, workout at gym twice a week and started 2 weeks ago. He denies chest pain, shortness of breath, dizziness.   He is on cholesterol medication (Fish Oil and Red Yeast rice Extract) and denies myalgias. His cholesterol is not at goal. The cholesterol last visit was:   Lab Results  Component Value Date   CHOL 156 07/10/2014   HDL 36* 07/10/2014   LDLCALC 84 07/10/2014   TRIG 180* 07/10/2014   CHOLHDL 4.3 07/10/2014   He has not been working on diet and has been working on exercise for prediabetes, and denies increased appetite, polydipsia and polyuria. Patient has had prediabetes since 2008.  Last A1C in the office was:  Lab Results  Component Value Date   HGBA1C 6.0* 07/10/2014  Number of meals per day? 3, 2 on weekends  B- english muffin with ham and cream cheese  L- fast food  D- fast food- steak pork [place]  Snacks? chips  Beverages? Diet soda, tea and splenda  Patient is on Vitamin D supplement.  - 5,000 IU daily Lab Results  Component Value Date   VD25OH 102* 07/10/2014     ROS- Review of Systems  Constitutional: Positive for fever. Negative  for chills, malaise/fatigue and diaphoresis.  HENT: Negative for congestion, ear discharge, ear pain and sore throat.   Eyes: Negative.   Respiratory: Negative.  Negative for cough, sputum production, shortness of breath and wheezing.   Cardiovascular: Negative.  Negative for chest pain and leg swelling.  Gastrointestinal: Positive for nausea and abdominal pain. Negative for vomiting, diarrhea, constipation, blood in stool and melena.  Genitourinary: Negative.  Negative for urgency and frequency.  Musculoskeletal: Negative.   Skin: Negative.   Neurological: Positive for headaches. Negative for dizziness.       Has chronic headaches and is controlled by medication  Psychiatric/Behavioral: Negative.    Medical History:  Past Medical History  Diagnosis Date  . Hypertension   . Vitamin D deficiency   . Hypogonadism male   . Elevated LDL cholesterol level   . Elevated hemoglobin A1c    Current Medications:  Current Outpatient Prescriptions on File Prior to Visit  Medication Sig Dispense Refill  . aspirin 81 MG chewable tablet Chew by mouth daily.    . celecoxib (CELEBREX) 200 MG capsule Take 200 mg by mouth daily as needed.    . diltiazem (CARDIZEM) 120 MG tablet TAKE ONE TABLET BY MOUTH ONCE DAILY 90 tablet 1  . fluticasone (FLONASE) 50 MCG/ACT nasal spray USE ONE SPRAY EACH NOSTRIL TWICE DAILY 16 g 3  . isometheptene-acetaminophen-dichloralphenazone (  MIDRIN) 65-325-100 MG capsule Take 1 capsule by mouth 4 (four) times daily as needed for migraine. Maximum 5 capsules in 12 hours for migraine headaches, 8 capsules in 24 hours for tension headaches.    Marland Kitchen lisinopril (PRINIVIL,ZESTRIL) 40 MG tablet 1/2- 1 pill daily 90 tablet 0  . Magnesium 250 MG TABS Take 250 mg by mouth daily.    . Omega-3 Fatty Acids (FISH OIL) 1000 MG CAPS Take by mouth 4 (four) times daily.    . pregabalin (LYRICA) 50 MG capsule Take 50 mg by mouth 2 (two) times daily.    . Red Yeast Rice Extract (RED YEAST RICE PO)  Take 1,200 mg by mouth 2 (two) times daily.     Marland Kitchen testosterone cypionate (DEPOTESTOTERONE CYPIONATE) 200 MG/ML injection INJECT 2 MLS INTRAMUSCULARLY EVERY 2 WEEKS 10 mL 0  . valACYclovir (VALTREX) 1000 MG tablet Take 1 tablet (1,000 mg total) by mouth daily. 30 tablet 1  . VITAMIN D, ERGOCALCIFEROL, PO Take 5,000 Int'l Units/day by mouth.     No current facility-administered medications on file prior to visit.   Allergies:  Allergies  Allergen Reactions  . Toprol Xl [Metoprolol Tartrate]     ED    Family history- Review and unchanged Social history- Review and unchanged  Physical Exam: BP 136/84 mmHg  Pulse 84  Temp(Src) 99.7 F (37.6 C)  Resp 16  Ht 6' 1.5" (1.867 m)  Wt 274 lb 6.4 oz (124.467 kg)  BMI 35.71 kg/m2 Wt Readings from Last 3 Encounters:  10/10/14 274 lb 6.4 oz (124.467 kg)  07/10/14 269 lb 12.8 oz (122.38 kg)  04/09/14 264 lb (119.75 kg)  Vitals Reviewed. General Appearance: Well nourished, in no apparent distress. Obese. Eyes: PERRLA, EOMIs, conjunctiva no swelling or erythema.  No scleral icterus. Sinuses: No Frontal/maxillary tenderness ENT/Mouth: External auditory canals clear, TMs without erythema, edema or bulging. No erythema, edema, or exudate on posterior pharynx.  Tonsils not swollen or erythematous. Hearing normal.  Neck: Supple, thyroid normal.  Respiratory: Respiratory effort normal, CTAB.  No w/r/r or stridor.  Cardio: RRR.  m/r/g. S1S2nl. Brisk peripheral pulses without edema.  Abdomen: Soft, rounded, + normal BS.  RUQ and epigastric tenderness upon palpation only.  No guarding, rebound, hernias, masses. Positive Murphy's sign. Lymphatics: Non tender without lymphadenopathy.  Musculoskeletal: Full ROM, 5/5 strength, normal gait.  Skin: Warm, dry intact without rashes, lesions, ecchymosis.  Neuro: Cranial nerves intact. No cerebellar symptoms. Sensation intact.  Psych: Awake and oriented X 3, normal affect, Insight and Judgment appropriate.    Assessment and Plan:  1. Essential hypertension Continue Cardizem and Lisinopril as prescribed. Monitor blood pressure at home.  Reminder to go to the ER if any CP, SOB, nausea, dizziness, severe HA, changes vision/speech, left arm numbness and tingling, and jaw pain.   - CBC with Differential - BASIC METABOLIC PANEL WITH GFR - Hepatic function panel  2. Hyperlipidemia Continue Fish Oil and Red Yeast Rice Extract as prescribed.  Please follow recommended diet and exercise.  Check cholesterol. - Lipid panel  3. PreDiabetes Please follow recommended diet and exercise.  Check A1C and Insulin levels. - Hemoglobin A1c - Insulin, fasting  4. Vitamin D deficiency Continue vitamin D as prescribed.  Check vitamin D level. - Vit D  25 hydroxy   5. Testosterone Deficiency Continue Testosterone as prescribed.  Check Testosterone level.  6. Encounter for long-term (current) use of medications Will monitor kidney and liver function. - CBC with Differential - BASIC METABOLIC PANEL  WITH GFR - Hepatic function panel - Magnesium  7. Abdominal pain, right upper quadrant Ordered labs and imaging to R/O pancreatitis, cholecystitis, fatty liver. If pain or fever gets worse, develop chills, sweats, fatigue, nausea, vomiting, diarrhea, constipation, then go to ED immediately. - Amylase - Lipase - US Abdomen Complete; Future  8. CKD (chronic kidney disease) stage 2, GFR 60-89 ml/min Will monitor kidney function.  Gave patient work note for tomorrow and return 10/14/14. Continue diet and meds as discussed. Further disposition pending results of labs. Discussed medication effects and SE's.  Pt agreed to treatment plan. Please keep your physical appt on 01/10/15.   Brentley Horrell, Stephani Police, PA-C 4:40 PM [facility]

## 2014-10-11 DIAGNOSIS — N182 Chronic kidney disease, stage 2 (mild): Secondary | ICD-10-CM | POA: Insufficient documentation

## 2014-10-11 LAB — LIPID PANEL
Cholesterol: 152 mg/dL (ref 0–200)
HDL: 31 mg/dL — AB (ref >39)
LDL Cholesterol: 89 mg/dL (ref 0–99)
TRIGLYCERIDES: 159 mg/dL — AB (ref <150)
Total CHOL/HDL Ratio: 4.9 Ratio
VLDL: 32 mg/dL (ref 0–40)

## 2014-10-11 LAB — HEPATIC FUNCTION PANEL
ALT: 37 U/L (ref 0–53)
AST: 54 U/L — ABNORMAL HIGH (ref 0–37)
Albumin: 4.4 g/dL (ref 3.5–5.2)
Alkaline Phosphatase: 48 U/L (ref 39–117)
BILIRUBIN INDIRECT: 0.3 mg/dL (ref 0.2–1.2)
Bilirubin, Direct: 0.1 mg/dL (ref 0.0–0.3)
TOTAL PROTEIN: 7.1 g/dL (ref 6.0–8.3)
Total Bilirubin: 0.4 mg/dL (ref 0.2–1.2)

## 2014-10-11 LAB — BASIC METABOLIC PANEL WITH GFR
BUN: 10 mg/dL (ref 6–23)
CO2: 26 mEq/L (ref 19–32)
CREATININE: 1.27 mg/dL (ref 0.50–1.35)
Calcium: 9.3 mg/dL (ref 8.4–10.5)
Chloride: 101 mEq/L (ref 96–112)
GFR, Est African American: 76 mL/min
GFR, Est Non African American: 65 mL/min
Glucose, Bld: 104 mg/dL — ABNORMAL HIGH (ref 70–99)
Potassium: 4 mEq/L (ref 3.5–5.3)
Sodium: 137 mEq/L (ref 135–145)

## 2014-10-11 LAB — HEMOGLOBIN A1C
HEMOGLOBIN A1C: 5.9 % — AB (ref <5.7)
MEAN PLASMA GLUCOSE: 123 mg/dL — AB (ref <117)

## 2014-10-11 LAB — VITAMIN D 25 HYDROXY (VIT D DEFICIENCY, FRACTURES): Vit D, 25-Hydroxy: 54 ng/mL (ref 30–100)

## 2014-10-11 LAB — AMYLASE: Amylase: 41 U/L (ref 0–105)

## 2014-10-11 LAB — MAGNESIUM: Magnesium: 2.2 mg/dL (ref 1.5–2.5)

## 2014-10-11 LAB — INSULIN, FASTING: INSULIN FASTING, SERUM: 66.4 u[IU]/mL — AB (ref 2.0–19.6)

## 2014-10-11 LAB — LIPASE: Lipase: 23 U/L (ref 0–75)

## 2014-10-15 LAB — TESTOSTERONE: Testosterone: 631 ng/dL (ref 300–890)

## 2014-10-17 ENCOUNTER — Ambulatory Visit
Admission: RE | Admit: 2014-10-17 | Discharge: 2014-10-17 | Disposition: A | Discharge status: Home / Self Care | Payer: BC Managed Care – PPO | Source: Ambulatory Visit | Attending: Physician Assistant | Admitting: Physician Assistant

## 2014-10-17 DIAGNOSIS — R1011 Right upper quadrant pain: Secondary | ICD-10-CM

## 2014-10-30 ENCOUNTER — Ambulatory Visit (AMBULATORY_SURGERY_CENTER): Payer: Self-pay

## 2014-10-30 VITALS — Ht 73.0 in | Wt 263.0 lb

## 2014-10-30 DIAGNOSIS — Z800 Family history of malignant neoplasm of digestive organs: Secondary | ICD-10-CM

## 2014-10-30 MED ORDER — MOVIPREP 100 G PO SOLR: 1.0000 | Freq: Once | ORAL | Status: DC

## 2014-10-30 NOTE — Progress Notes (Signed)
No allergies to eggs or soy No home oxygen No diet/weight loss meds No past problems with anesthesia  Has email  Emmi instructions given for colonoscopy 

## 2014-11-13 ENCOUNTER — Ambulatory Visit (AMBULATORY_SURGERY_CENTER): Payer: BC Managed Care – PPO | Admitting: Internal Medicine

## 2014-11-13 ENCOUNTER — Encounter: Payer: Self-pay | Admitting: Internal Medicine

## 2014-11-13 VITALS — BP 116/67 | HR 70 | Temp 95.7°F | Resp 12 | Ht 73.0 in | Wt 263.0 lb

## 2014-11-13 DIAGNOSIS — D125 Benign neoplasm of sigmoid colon: Secondary | ICD-10-CM

## 2014-11-13 DIAGNOSIS — K5730 Diverticulosis of large intestine without perforation or abscess without bleeding: Secondary | ICD-10-CM

## 2014-11-13 DIAGNOSIS — Z1211 Encounter for screening for malignant neoplasm of colon: Secondary | ICD-10-CM

## 2014-11-13 DIAGNOSIS — Z800 Family history of malignant neoplasm of digestive organs: Secondary | ICD-10-CM

## 2014-11-13 MED ORDER — SODIUM CHLORIDE 0.9 % IV SOLN: 500.0000 mL | INTRAVENOUS | Status: DC

## 2014-11-13 NOTE — Op Note (Addendum)
[place]  [address] [place], [address]   COLONOSCOPY PROCEDURE REPORT  PATIENT: Donald Schwartz, Donald Schwartz  MR#: [id number] BIRTHDATE: November 16, 1965 , 48  yrs. old GENDER: male ENDOSCOPIST: Gatha Mayer, MD, [facility] PROCEDURE DATE:  11/13/2014 PROCEDURE:   Colonoscopy with snare polypectomy First Screening Colonoscopy - Avg.  risk and is 50 yrs.  old or older Yes.  Prior Negative Screening - Now for repeat screening. N/A  History of Adenoma - Now for follow-up colonoscopy & has been > or = to 3 yrs.  N/A  Polyps Removed Today? Yes. ASA CLASS:   Class II INDICATIONS:patient's family history of colon cancer, distant relatives. MEDICATIONS: Propofol 250 mg IV and Monitored anesthesia care  DESCRIPTION OF PROCEDURE:   After the risks benefits and alternatives of the procedure were thoroughly explained, informed consent was obtained.  The digital rectal exam revealed no abnormalities of the rectum, revealed no prostatic nodules, and revealed the prostate was not enlarged.   The LB [id number] endoscope was introduced through the anus and advanced to the cecum, which was identified by both the appendix and ileocecal valve. No adverse events experienced.   The quality of the prep was excellent, using MoviPrep  The instrument was then slowly withdrawn as the colon was fully examined.      COLON FINDINGS: 1) Diminutive sigmoid polyp removed with cold snare and sent to pathology 2) Sigmoid (severe) and right-side (scattered) diverticulosis 3) Otherwise normal colonoscopy with excellent prep - first screen.  Retroflexed views revealed no abnormalities. The time to cecum=2 minutes 21 seconds.  Withdrawal time=11 minutes 34 seconds.  The scope was withdrawn and the procedure completed. COMPLICATIONS: There were no immediate complications.  ENDOSCOPIC IMPRESSION: 1) Diminutive sigmoid polyp removed with cold snare and sent to pathology 2) Sigmoid (severe) and  right-side (scattered) diverticulosis 3) Otherwise normal colonoscopy with excellent prep - first screen - has 4 second-dgeree relatives with colon cancer  RECOMMENDATIONS: Timing of repeat colonoscopy will be determined by pathology findings.  eSigned:  Gatha Mayer, MD, [facility] 11/13/2014 9:01 AM Revised: 11/13/2014 9:01 AM  cc: The Patient and Radene Ou, MD

## 2014-11-13 NOTE — Progress Notes (Signed)
Report to PACU, RN, vss, BBS= Clear.  

## 2014-11-13 NOTE — Patient Instructions (Addendum)
I found and removed one tiny polyp that looks benign.  You also have a condition called diverticulosis - common and not usually a problem. Please read the handout provided.  I will let you know pathology results and when to have another routine colonoscopy by mail.  I appreciate the opportunity to care for you. Gatha Mayer, MD, [facility]   Discharge instructions given. Handouts on polyps and diverticulosis. Resume previous medications. YOU HAD AN ENDOSCOPIC PROCEDURE TODAY AT [facility]: Refer to the procedure report that was given to you for any specific questions about what was found during the examination.  If the procedure report does not answer your questions, please call your gastroenterologist to clarify.  If you requested that your care partner not be given the details of your procedure findings, then the procedure report has been included in a sealed envelope for you to review at your convenience later.  YOU SHOULD EXPECT: Some feelings of bloating in the abdomen. Passage of more gas than usual.  Walking can help get rid of the air that was put into your GI tract during the procedure and reduce the bloating. If you had a lower endoscopy (such as a colonoscopy or flexible sigmoidoscopy) you may notice spotting of blood in your stool or on the toilet paper. If you underwent a bowel prep for your procedure, then you may not have a normal bowel movement for a few days.  DIET: Your first meal following the procedure should be a light meal and then it is ok to progress to your normal diet.  A half-sandwich or bowl of soup is an example of a good first meal.  Heavy or fried foods are harder to digest and may make you feel nauseous or bloated.  Likewise meals heavy in dairy and vegetables can cause extra gas to form and this can also increase the bloating.  Drink plenty of fluids but you should avoid alcoholic beverages for 24 hours.  ACTIVITY: Your care partner should  take you home directly after the procedure.  You should plan to take it easy, moving slowly for the rest of the day.  You can resume normal activity the day after the procedure however you should NOT DRIVE or use heavy machinery for 24 hours (because of the sedation medicines used during the test).    SYMPTOMS TO REPORT IMMEDIATELY: A gastroenterologist can be reached at any hour.  During normal business hours, 8:30 AM to 5:00 PM Monday through Friday, call [phone].  After hours and on weekends, please call the GI answering service at [phone] who will take a message and have the physician on call contact you.   Following lower endoscopy (colonoscopy or flexible sigmoidoscopy):  Excessive amounts of blood in the stool  Significant tenderness or worsening of abdominal pains  Swelling of the abdomen that is new, acute  Fever of 100F or higher FOLLOW UP: If any biopsies were taken you will be contacted by phone or by letter within the next 1-3 weeks.  Call your gastroenterologist if you have not heard about the biopsies in 3 weeks.  Our staff will call the home number listed on your records the next business day following your procedure to check on you and address any questions or concerns that you may have at that time regarding the information given to you following your procedure. This is a courtesy call and so if there is no answer at the home number and we have  not heard from you through the emergency physician on call, we will assume that you have returned to your regular daily activities without incident.  SIGNATURES/CONFIDENTIALITY: You and/or your care partner have signed paperwork which will be entered into your electronic medical record.  These signatures attest to the fact that that the information above on your After Visit Summary has been reviewed and is understood.  Full responsibility of the confidentiality of this discharge information lies with you and/or your  care-partner.

## 2014-11-13 NOTE — Progress Notes (Signed)
Called to room to assist during endoscopic procedure.  Patient ID and intended procedure confirmed with present staff. Received instructions for my participation in the procedure from the performing physician.  

## 2014-11-14 ENCOUNTER — Telehealth: Payer: Self-pay | Admitting: *Deleted

## 2014-11-14 NOTE — Telephone Encounter (Signed)
  Follow up Call-  Call back number 11/13/2014  Post procedure Call Back phone  # [phone]  Permission to leave phone message Yes     Patient questions:  Do you have a fever, pain , or abdominal swelling? No. Pain Score  0 *  Have you tolerated food without any problems? Yes.    Have you been able to return to your normal activities? Yes.    Do you have any questions about your discharge instructions: Diet   No. Medications  No. Follow up visit  No.  Do you have questions or concerns about your Care? No.  Actions: * If pain score is 4 or above: No action needed, pain <4.

## 2014-11-20 ENCOUNTER — Encounter: Payer: Self-pay | Admitting: Internal Medicine

## 2014-11-20 DIAGNOSIS — Z860100 Personal history of colon polyps, unspecified: Secondary | ICD-10-CM

## 2014-11-20 HISTORY — DX: Personal history of colon polyps, unspecified: Z86.0100

## 2014-11-20 HISTORY — DX: Personal history of colonic polyps: Z86.010

## 2014-11-20 NOTE — Progress Notes (Signed)
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

## 2014-11-27 ENCOUNTER — Ambulatory Visit (INDEPENDENT_AMBULATORY_CARE_PROVIDER_SITE_OTHER): Payer: BC Managed Care – PPO | Admitting: Internal Medicine

## 2014-11-27 ENCOUNTER — Encounter: Payer: Self-pay | Admitting: Internal Medicine

## 2014-11-27 VITALS — BP 124/70 | HR 82 | Temp 98.6°F | Resp 18 | Ht 73.5 in | Wt 256.0 lb

## 2014-11-27 DIAGNOSIS — R519 Headache, unspecified: Secondary | ICD-10-CM

## 2014-11-27 DIAGNOSIS — R51 Headache: Secondary | ICD-10-CM

## 2014-11-27 MED ORDER — PREDNISONE 20 MG PO TABS: ORAL_TABLET | ORAL | Status: DC

## 2014-11-27 NOTE — Patient Instructions (Signed)
Trigeminal Neuralgia Trigeminal neuralgia is a nerve disorder that causes sudden attacks of severe facial pain. It is caused by damage to the trigeminal nerve, a major nerve in the face. It is more common in women and in the elderly, although it can also happen in younger patients. Attacks last from a few seconds to several minutes and can occur from a couple of times per year to several times per day. Trigeminal neuralgia can be a very distressing and disabling condition. Surgery may be needed in very severe cases if medical treatment does not give relief. HOME CARE INSTRUCTIONS   If your caregiver prescribed medication to help prevent attacks, take as directed.  To help prevent attacks:  Chew on the unaffected side of the mouth.  Avoid touching your face.  Avoid blasts of hot or cold air.  Men may wish to grow a beard to avoid having to shave. SEEK IMMEDIATE MEDICAL CARE IF:  Pain is unbearable and your medicine does not help.  You develop new, unexplained symptoms (problems).  You have problems that may be related to a medication you are taking. Document Released: 09/03/2000 Document Revised: 11/29/2011 Document Reviewed: 07/04/2009 [facility] Patient Information 2015 [place], [place]. This information is not intended to replace advice given to you by your health care provider. Make sure you discuss any questions you have with your health care provider.  Temporal Arteritis Arteries are blood vessels that carry blood from the heart to all parts of the body. Temporal arteritis is a swelling (inflammation) of certain large arteries. This usually affects arteries in the head and neck area, including arteries in the area on the side of the head, between the ears and eyes (temples). The condition can be very painful. It also can cause serious problems, even blindness. Early diagnosis and treatment is very important. CAUSES  Temporal arteritis results from the body reacting to injury or  infection (inflammation). This may occur when the body's immune system (which fights germs and disease) makes a mistake. It attacks its own arteries. No one knows why this happens. However, certain things (risk factors) make it more likely that a person will develop temporal arteritis. They include:  Age. Most people with temporal arteritis are older than 50. The average age is 64.  Sex. Three times more women than men develop the condition.  Race and ethnic background. Caucasians are more likely to have temporal arteritis than other races. So are people whose families came from [place] or [place]  Having polymyalgia rheumatica (PMR). This condition causes stiffness and pain in the joints of the neck, shoulders and hips. About 15% of people with PMR also have temporal arteritis. SYMPTOMS  Not everyone with temporal arteritis has the same symptoms. Some people have just one symptom. Others may have several. The most common symptom is a new headache, often in the temple region. Symptoms may show up in other parts of the body too.   Symptoms affecting the head may include:  Temporal arteries that feel hard or swollen. It may hurt when the temples are touched.  Pain when combing your hair, or when laying your head on a pillow.  Pain in the jaw when chewing.  Pain in the throat or tongue.  Visual problems, including sudden loss of vision in one eye, or seeing double.  Symptoms in other parts of the body may include:  Fever.  Fatigue.  A dry cough.  Pain in the hips and shoulders.  Pain in the arms  during exercise.  Depression.  Weight loss. DIAGNOSIS  Symptoms of temporal arteritis are similar to symptoms for other conditions. This can make it hard to tell if you have the condition. To be sure, your caregiver will ask about your symptoms and do a physical exam. Certain tests may be necessary, such as:   An exam of your temples. Often, the  temporal arteries will be swollen and hard. This can be felt.  A complete blood count. This test shows how many red blood cells are in your blood. Most people with temporal arteritis do not have enough red blood cells (anemic).  Erythrocyte sedimentation (also called sed rate test). It measures inflammation in the body. Almost everyone with temporal arteritis has a high sed rate.  C reactive protein test. This also shows if there is inflammation.  Biopsy a temporal artery. This means the caregiver will take out a small piece of an artery. Then, it is checked under a microscope for inflammation. More than one biopsy may be needed. That is because inflammation can be in one part of an artery and not in others. Your caregiver may need to check more than one spot. TREATMENT  Starting treatment right away is very important. Often, you will need to see a specialist in immunologic diseases (rheumatologist). Goals of treatment include protecting your eyesight. Once vision is gone, it might not come back. The normal treatment is medication. It usually works well and quickly. Most people start getting better in a few days. Medication options include:  Corticosteroids. These are powerful drugs that fight inflammation. These drugs are most often used to treat temporal arteritis.  Usually, a high dose is taken at first. After symptoms improve, a smaller dose is used. The goal is to take the smallest dose possible and still control your symptoms. That is because using corticosteroids for a long time can cause problems. They can make muscles and bones weak. They can cause blood pressure to go up, and cause diabetes. Also, people often gain weight when they take corticosteroids. Corticosteroids may need to be taken for one or two years.  Several newer drugs are being tested to treat temporal arteritis. Researchers are testing to see if new drugs will work as well as corticosteroids, but cause fewer problems than  them. Testing of these drugs is not yet complete.  Some specialists recommend low dose aspirin to prevent blood clots. HOME CARE INSTRUCTIONS   Take any corticosteroids that your caregiver prescribes. Follow the directions carefully.  Take any vitamins or supplements that your caregiver suggests. This may include vitamin D and calcium. They help keep your bones from becoming weak.  Keep all appointments for checkups. Your caregiver will watch for any problems from the medication. Checkups may include:  Periodic blood tests.  Bone density testing. This checks how strong or weak your bones are.  Blood pressure checks. If your blood pressure rises, you may need to take a drug to control it while you are taking corticosteroids.  Blood sugar checks. This is to be sure you are not developing diabetes. If you have diabetes, corticosteroid medications may make it worse and require increased treatment.  Exercise. First, talk with your caregiver about what would be OK for you to do. Aerobic exercise (which increases your heart rate) is usually suggested. It does not have to require a lot of energy. Walking is aerobic exercise. This type of exercise is good because it helps prevent bone loss. It also helps control your blood pressure.  Follow a healthy diet. The goal is to prevent bone damage and diabetes. Include good sources of protein in your diet. Also, include fruits, vegetables and whole grains. Your caregiver can refer you to an expert on healthy eating (dietitian) for more advice. SEEK MEDICAL CARE IF:   The symptoms that lead to your diagnosis return.  You develop worsening fever, fatigue, headache, weight loss, or pain in your jaw.  You develop signs of infection. Infections can be worse if you are on corticosteroid medication. SEEK IMMEDIATE MEDICAL CARE IF:   Your eyesight changes.  Pain does not go away, even after taking pain medicine.  You feel pain in your chest.  Breathing  is difficult.  One side of your face or body suddenly becomes weak or numb.  You develop a fever of more than 102 F (38.9 C). Document Released: 07/04/2009 Document Revised: 11/29/2011 Document Reviewed: 10/31/2013 [facility] Patient Information 2015 [place], [place]. This information is not intended to replace advice given to you by your health care provider. Make sure you discuss any questions you have with your health care provider.  Temporomandibular Problems  Temporomandibular joint (TMJ) dysfunction means there are problems with the joint between your jaw and your skull. This is a joint lined by cartilage like other joints in your body but also has a small disc in the joint which keeps the bones from rubbing on each other. These joints are like other joints and can get inflamed (sore) from arthritis and other problems. When this joint gets sore, it can cause headaches and pain in the jaw and the face. CAUSES  Usually the arthritic types of problems are caused by soreness in the joint. Soreness in the joint can also be caused by overuse. This may come from grinding your teeth. It may also come from mis-alignment in the joint. DIAGNOSIS Diagnosis of this condition can often be made by history and exam. Sometimes your caregiver may need X-rays or an MRI scan to determine the exact cause. It may be necessary to see your dentist to determine if your teeth and jaws are lined up correctly. TREATMENT  Most of the time this problem is not serious; however, sometimes it can persist (become chronic). When this happens medications that will cut down on inflammation (soreness) help. Sometimes a shot of cortisone into the joint will be helpful. If your teeth are not aligned it may help for your dentist to make a splint for your mouth that can help this problem. If no physical problems can be found, the problem may come from tension. If tension is found to be the cause, biofeedback or relaxation techniques may  be helpful. HOME CARE INSTRUCTIONS   Later in the day, applications of ice packs may be helpful. Ice can be used in a plastic bag with a towel around it to prevent frostbite to skin. This may be used about every 2 hours for 20 to 30 minutes, as needed while awake, or as directed by your caregiver.  Only take over-the-counter or prescription medicines for pain, discomfort, or fever as directed by your caregiver.  If physical therapy was prescribed, follow your caregiver's directions.  Wear mouth appliances as directed if they were given. Document Released: 06/01/2001 Document Revised: 11/29/2011 Document Reviewed: 09/08/2008 [facility] Patient Information 2015 [place], [place]. This information is not intended to replace advice given to you by your health care provider. Make sure you discuss any questions you have with your health care provider.

## 2014-11-27 NOTE — Progress Notes (Signed)
   Subjective:    Patient ID: Donald Schwartz, male    DOB: 1965-11-22, 49 y.o.   MRN: [id number]  HPI  Patient reports to the office for evaluation of right eye pain that has been going on for 6-8 weeks.  The patient expresses that he has pain with pressure against his eye.  Even putting on a sweatshirt is really painful.  It produces a very [place] sensation.  Today it nearly brought him to a halt.  He has tenderness when he scratches his scalp and sometimes when he is brushing his teeth with the electronic toothbrush.  Patient denies pain with chewing.  No rash, redness, or eye pain.     Review of Systems  Constitutional: Negative for fever, chills and fatigue.  HENT: Negative for congestion, dental problem, mouth sores, nosebleeds, postnasal drip, rhinorrhea, sneezing, sore throat, trouble swallowing and voice change.   Eyes: Negative for photophobia, pain, discharge, redness, itching and visual disturbance.  Respiratory: Negative for chest tightness and shortness of breath.   Gastrointestinal: Negative for nausea and vomiting.  Musculoskeletal: Negative for myalgias, joint swelling and arthralgias.  Skin: Negative for rash.  Neurological: Negative for dizziness, light-headedness, numbness and headaches.       Objective:   Physical Exam  Constitutional: He is oriented to person, place, and time. He appears well-developed and well-nourished. No distress.  HENT:  Head: Normocephalic and atraumatic.  Mouth/Throat: Oropharynx is clear and moist. No oropharyngeal exudate.  Tenderness to palpation of the right temporal region and mild tenderness to palpation of the right scalp.  Eyes: Conjunctivae and EOM are normal. Pupils are equal, round, and reactive to light. No scleral icterus.  Neck: Normal range of motion. Neck supple. No JVD present. No thyromegaly present.  Cardiovascular: Normal rate, regular rhythm, normal heart sounds and intact distal pulses.  Exam reveals no gallop and no  friction rub.   No murmur heard. Pulmonary/Chest: Effort normal and breath sounds normal. No respiratory distress. He has no wheezes. He has no rales. He exhibits no tenderness.  Abdominal: Soft. Bowel sounds are normal. He exhibits no distension and no mass. There is no tenderness. There is no rebound and no guarding.  Musculoskeletal: Normal range of motion.  Lymphadenopathy:    He has no cervical adenopathy.  Neurological: He is alert and oriented to person, place, and time. He has normal strength. No cranial nerve deficit or sensory deficit. Coordination normal.  Skin: Skin is warm and dry. He is not diaphoretic.  Psychiatric: He has a normal mood and affect. His behavior is normal. Judgment and thought content normal.  Nursing note and vitals reviewed.         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  There is no eye redness, or fixed pupil.  Patient's hx and exam do no support acute angle glaucoma.  Differential includes trigeminal neuralgia, TMJ, and temporal arteritis.  Will get sed rate to rule out arteritis.  Will start on high dose prednisone until results received.  Patient to follow-up with eye doctor.  Referral given.  1. Facial pain  - Sed Rate (ESR) - predniSONE (DELTASONE) 20 MG tablet; Take 3 tabs daily w/ breakfast  Dispense: 30 tablet; Refill: 0 - Ambulatory referral to Ophthalmology

## 2014-11-28 LAB — SEDIMENTATION RATE: SED RATE: 1 mm/h (ref 0–15)

## 2014-12-04 ENCOUNTER — Ambulatory Visit (INDEPENDENT_AMBULATORY_CARE_PROVIDER_SITE_OTHER): Payer: BC Managed Care – PPO | Admitting: Internal Medicine

## 2014-12-04 ENCOUNTER — Encounter: Payer: Self-pay | Admitting: Internal Medicine

## 2014-12-04 VITALS — BP 136/68 | HR 72 | Temp 98.4°F | Resp 18 | Ht 73.5 in | Wt 255.0 lb

## 2014-12-04 DIAGNOSIS — G500 Trigeminal neuralgia: Secondary | ICD-10-CM

## 2014-12-04 NOTE — Patient Instructions (Signed)
Trigeminal Neuralgia  Trigeminal neuralgia is a nerve disorder that causes sudden attacks of severe facial pain. It is caused by damage to the trigeminal nerve, a major nerve in the face. It is more common in women and in the elderly, although it can also happen in younger patients. Attacks last from a few seconds to several minutes and can occur from a couple of times per year to several times per day. Trigeminal neuralgia can be a very distressing and disabling condition. Surgery may be needed in very severe cases if medical treatment does not give relief.  HOME CARE INSTRUCTIONS    If your caregiver prescribed medication to help prevent attacks, take as directed.   To help prevent attacks:   Chew on the unaffected side of the mouth.   Avoid touching your face.   Avoid blasts of hot or cold air.   Men may wish to grow a beard to avoid having to shave.  SEEK IMMEDIATE MEDICAL CARE IF:   Pain is unbearable and your medicine does not help.   You develop new, unexplained symptoms (problems).   You have problems that may be related to a medication you are taking.  Document Released: 09/03/2000 Document Revised: 11/29/2011 Document Reviewed: 07/04/2009  [employer] Patient Information 2015 [employer]. This information is not intended to replace advice given to you by your health care provider. Make sure you discuss any questions you have with your health care provider.

## 2014-12-04 NOTE — Progress Notes (Signed)
   Subjective:    Patient ID: Donald Schwartz, male    DOB: [birth year], 49 y.o.   MRN: [id number]  HPI  Patient presents to the office for follow-up of facial pain.  Patient was seen by Dr. Delman Cheadle and had totally normal occular testing.  He feels that working out is making his pain significantly worse.  He does not feel that the prednisone helped slightly but not a whole lot.     Review of Systems  Constitutional: Negative for fever, chills and fatigue.  HENT: Negative for congestion, ear discharge, ear pain, facial swelling, rhinorrhea, sore throat, tinnitus, trouble swallowing and voice change.   Eyes: Negative.   Respiratory: Negative for chest tightness and shortness of breath.   Cardiovascular: Negative.   Gastrointestinal: Negative.   Genitourinary: Negative.   Musculoskeletal: Negative.   Skin: Negative.   Neurological: Negative for numbness.       Objective:   Physical Exam  Constitutional: He appears well-developed and well-nourished. No distress.  HENT:  Head: Normocephalic and atraumatic.  Mouth/Throat: Oropharynx is clear and moist. No oropharyngeal exudate.  Tenderness to palpation of the right side of the face from the tip of the nose to the top of crown of the scalp.  There is no rash, redness, or lack of facial movement.  Eyes: Conjunctivae are normal. No scleral icterus.  Neck: Normal range of motion. Neck supple. No JVD present. No thyromegaly present.  Cardiovascular: Normal rate, regular rhythm, normal heart sounds and intact distal pulses.  Exam reveals no gallop and no friction rub.   No murmur heard. Pulmonary/Chest: Effort normal and breath sounds normal. No respiratory distress. He has no wheezes. He has no rales. He exhibits no tenderness.  Lymphadenopathy:    He has no cervical adenopathy.  Skin: He is not diaphoretic.  Nursing note and vitals reviewed.   Filed Vitals:   12/04/14 1613  BP: 136/68  Pulse: 72  Temp: 98.4 F (36.9 C)  Resp: 18      Assessment & Plan:   1. 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  He would like to watch it for a month to see what happens.   -patient to follow-up prn.

## 2015-01-10 ENCOUNTER — Ambulatory Visit (INDEPENDENT_AMBULATORY_CARE_PROVIDER_SITE_OTHER): Payer: BC Managed Care – PPO | Admitting: Internal Medicine

## 2015-01-10 ENCOUNTER — Encounter: Payer: Self-pay | Admitting: Internal Medicine

## 2015-01-10 VITALS — BP 124/64 | HR 70 | Temp 98.4°F | Resp 18 | Ht 72.25 in | Wt 247.0 lb

## 2015-01-10 DIAGNOSIS — Z125 Encounter for screening for malignant neoplasm of prostate: Secondary | ICD-10-CM

## 2015-01-10 DIAGNOSIS — R5383 Other fatigue: Secondary | ICD-10-CM

## 2015-01-10 DIAGNOSIS — I10 Essential (primary) hypertension: Secondary | ICD-10-CM

## 2015-01-10 DIAGNOSIS — E785 Hyperlipidemia, unspecified: Secondary | ICD-10-CM

## 2015-01-10 DIAGNOSIS — E291 Testicular hypofunction: Secondary | ICD-10-CM

## 2015-01-10 DIAGNOSIS — R7309 Other abnormal glucose: Secondary | ICD-10-CM

## 2015-01-10 DIAGNOSIS — G500 Trigeminal neuralgia: Secondary | ICD-10-CM

## 2015-01-10 DIAGNOSIS — E559 Vitamin D deficiency, unspecified: Secondary | ICD-10-CM

## 2015-01-10 DIAGNOSIS — G4733 Obstructive sleep apnea (adult) (pediatric): Secondary | ICD-10-CM

## 2015-01-10 DIAGNOSIS — Z1212 Encounter for screening for malignant neoplasm of rectum: Secondary | ICD-10-CM

## 2015-01-10 DIAGNOSIS — Z79899 Other long term (current) drug therapy: Secondary | ICD-10-CM

## 2015-01-10 DIAGNOSIS — N182 Chronic kidney disease, stage 2 (mild): Secondary | ICD-10-CM

## 2015-01-10 DIAGNOSIS — Z9989 Dependence on other enabling machines and devices: Secondary | ICD-10-CM

## 2015-01-10 LAB — CBC WITH DIFFERENTIAL/PLATELET
BASOS ABS: 0.1 10*3/uL (ref 0.0–0.1)
Basophils Relative: 1 % (ref 0–1)
EOS ABS: 0.2 10*3/uL (ref 0.0–0.7)
EOS PCT: 2 % (ref 0–5)
HCT: 48.5 % (ref 39.0–52.0)
Hemoglobin: 17.1 g/dL — ABNORMAL HIGH (ref 13.0–17.0)
Lymphocytes Relative: 14 % (ref 12–46)
Lymphs Abs: 1.2 10*3/uL (ref 0.7–4.0)
MCH: 30 pg (ref 26.0–34.0)
MCHC: 35.3 g/dL (ref 30.0–36.0)
MCV: 85.1 fL (ref 78.0–100.0)
MPV: 9.2 fL (ref 8.6–12.4)
Monocytes Absolute: 1.2 10*3/uL — ABNORMAL HIGH (ref 0.1–1.0)
Monocytes Relative: 14 % — ABNORMAL HIGH (ref 3–12)
Neutro Abs: 5.9 10*3/uL (ref 1.7–7.7)
Neutrophils Relative %: 69 % (ref 43–77)
PLATELETS: 206 10*3/uL (ref 150–400)
RBC: 5.7 MIL/uL (ref 4.22–5.81)
RDW: 15 % (ref 11.5–15.5)
WBC: 8.5 10*3/uL (ref 4.0–10.5)

## 2015-01-10 LAB — IRON AND TIBC
%SAT: 9 % — ABNORMAL LOW (ref 20–55)
Iron: 29 ug/dL — ABNORMAL LOW (ref 42–165)
TIBC: 327 ug/dL (ref 215–435)
UIBC: 298 ug/dL (ref 125–400)

## 2015-01-10 LAB — TSH: TSH: 2.155 u[IU]/mL (ref 0.350–4.500)

## 2015-01-10 LAB — HEMOGLOBIN A1C
Hgb A1c MFr Bld: 6 % — ABNORMAL HIGH (ref <5.7)
Mean Plasma Glucose: 126 mg/dL — ABNORMAL HIGH (ref <117)

## 2015-01-10 LAB — HEPATIC FUNCTION PANEL
ALT: 27 U/L (ref 0–53)
AST: 30 U/L (ref 0–37)
Albumin: 4.2 g/dL (ref 3.5–5.2)
Alkaline Phosphatase: 53 U/L (ref 39–117)
Bilirubin, Direct: 0.1 mg/dL (ref 0.0–0.3)
Indirect Bilirubin: 0.4 mg/dL (ref 0.2–1.2)
Total Bilirubin: 0.5 mg/dL (ref 0.2–1.2)
Total Protein: 7.1 g/dL (ref 6.0–8.3)

## 2015-01-10 LAB — LIPID PANEL
Cholesterol: 137 mg/dL (ref 0–200)
HDL: 30 mg/dL — AB (ref >=40)
LDL Cholesterol: 88 mg/dL (ref 0–99)
Total CHOL/HDL Ratio: 4.6 Ratio
Triglycerides: 93 mg/dL (ref <150)
VLDL: 19 mg/dL (ref 0–40)

## 2015-01-10 LAB — BASIC METABOLIC PANEL WITH GFR
BUN: 12 mg/dL (ref 6–23)
CALCIUM: 8.6 mg/dL (ref 8.4–10.5)
CO2: 20 mEq/L (ref 19–32)
Chloride: 99 mEq/L (ref 96–112)
Creat: 1.18 mg/dL (ref 0.50–1.35)
GFR, EST AFRICAN AMERICAN: 83 mL/min
GFR, EST NON AFRICAN AMERICAN: 72 mL/min
GLUCOSE: 97 mg/dL (ref 70–99)
POTASSIUM: 3.9 meq/L (ref 3.5–5.3)
Sodium: 132 mEq/L — ABNORMAL LOW (ref 135–145)

## 2015-01-10 LAB — VITAMIN B12: Vitamin B-12: 412 pg/mL (ref 211–911)

## 2015-01-10 LAB — MAGNESIUM: Magnesium: 1.9 mg/dL (ref 1.5–2.5)

## 2015-01-10 MED ORDER — PREGABALIN 100 MG PO CAPS: ORAL_CAPSULE | ORAL | Status: DC

## 2015-01-10 NOTE — Patient Instructions (Signed)
Recommend the book "The END of DIETING" by Dr Excell Seltzer   & the book "The END of DIABETES " by Dr Excell Seltzer  At [email or web address] - get book & Audio CD's     Being diabetic has a  300% increased risk for heart attack, stroke, cancer, and alzheimer- type vascular dementia. It is very important that you work harder with diet by avoiding all foods that are white. Avoid white rice (brown & wild rice is OK), white potatoes (sweetpotatoes in moderation is OK), White bread or wheat bread or anything made out of white flour like bagels, donuts, rolls, buns, biscuits, cakes, pastries, cookies, pizza crust, and pasta (made from white flour & egg whites) - vegetarian pasta or spinach or wheat pasta is OK. Multigrain breads like Arnold's or Pepperidge Farm, or multigrain sandwich thins or flatbreads.  Diet, exercise and weight loss can reverse and cure diabetes in the early stages.  Diet, exercise and weight loss is very important in the control and prevention of complications of diabetes which affects every system in your body, ie. Brain - dementia/stroke, eyes - glaucoma/blindness, heart - heart attack/heart failure, kidneys - dialysis, stomach - gastric paralysis, intestines - malabsorption, nerves - severe painful neuritis, circulation - gangrene & loss of a leg(s), and finally cancer and Alzheimers.    I recommend avoid fried & greasy foods,  sweets/candy, white rice (brown or wild rice or Quinoa is OK), white potatoes (sweet potatoes are OK) - anything made from white flour - bagels, doughnuts, rolls, buns, biscuits,white and wheat breads, pizza crust and traditional pasta made of white flour & egg white(vegetarian pasta or spinach or wheat pasta is OK).  Multi-grain bread is OK - like multi-grain flat bread or sandwich thins. Avoid alcohol in excess. Exercise is also important.    Eat all the vegetables you want - avoid meat, especially red meat and dairy - especially cheese.  Cheese is the most concentrated  form of trans-fats which is the worst thing to clog up our arteries. Veggie cheese is OK which can be found in the fresh produce section at [employer] or [employer] or [employer]  ++++++++++++++++++++++++++++++++++++++++++++++++++++++++ Preventative Care for Adults - Male      MAINTAIN REGULAR HEALTH EXAMS:  A routine yearly physical is a good way to check in with your primary care provider about your health and preventive screening. It is also an opportunity to share updates about your health and any concerns you have, and receive a thorough all-over exam.   Most health insurance companies pay for at least some preventative services.  Check with your health plan for specific coverages.  WHAT PREVENTATIVE SERVICES DO WOMEN NEED?  Adult women should have their weight and blood pressure checked regularly.   Women age 43 and older should have their cholesterol levels checked regularly.  Women should be screened for cervical cancer with a Pap smear and pelvic exam beginning at either age 10, or 3 years after they become sexually activity.    Breast cancer screening generally begins at age 8 with a mammogram and breast exam by your primary care provider.    Beginning at age 69 and continuing to age 73, women should be screened for colorectal cancer.  Certain people may need continued testing until age 52.  Updating vaccinations is part of preventative care.  Vaccinations help protect against diseases such as the flu.  Osteoporosis is a disease in which the bones lose minerals and strength as we age. Women ages  50 and over should discuss this with their caregivers, as should women after menopause who have other risk factors.  Lab tests are generally done as part of preventative care to screen for anemia and blood disorders, to screen for problems with the kidneys and liver, to screen for bladder problems, to check blood sugar, and to check your cholesterol level.  Preventative services  generally include counseling about diet, exercise, avoiding tobacco, drugs, excessive alcohol consumption, and sexually transmitted infections.    GENERAL RECOMMENDATIONS FOR GOOD HEALTH:  Healthy diet:  Eat a variety of foods, including fruit, vegetables, animal or vegetable protein, such as meat, fish, chicken, and eggs, or beans, lentils, tofu, and grains, such as rice.  Drink plenty of water daily.  Decrease saturated fat in the diet, avoid lots of red meat, processed foods, sweets, fast foods, and fried foods.  Exercise:  Aerobic exercise helps maintain good heart health. At least 30-40 minutes of moderate-intensity exercise is recommended. For example, a brisk walk that increases your heart rate and breathing. This should be done on most days of the week.   Find a type of exercise or a variety of exercises that you enjoy so that it becomes a part of your daily life.  Examples are running, walking, swimming, water aerobics, and biking.  For motivation and support, explore group exercise such as aerobic class, spin class, Zumba, Yoga,or  martial arts, etc.    Set exercise goals for yourself, such as a certain weight goal, walk or run in a race such as a 5k walk/run.  Speak to your primary care provider about exercise goals.  Disease prevention:  If you smoke or chew tobacco, find out from your caregiver how to quit. It can literally save your life, no matter how long you have been a tobacco user. If you do not use tobacco, never begin.   Maintain a healthy diet and normal weight. Increased weight leads to problems with blood pressure and diabetes.   The Body Mass Index or BMI is a way of measuring how much of your body is fat. Having a BMI above 27 increases the risk of heart disease, diabetes, hypertension, stroke and other problems related to obesity. Your caregiver can help determine your BMI and based on it develop an exercise and dietary program to help you achieve or maintain this  important measurement at a healthful level.  High blood pressure causes heart and blood vessel problems.  Persistent high blood pressure should be treated with medicine if weight loss and exercise do not work.   Fat and cholesterol leaves deposits in your arteries that can block them. This causes heart disease and vessel disease elsewhere in your body.  If your cholesterol is found to be high, or if you have heart disease or certain other medical conditions, then you may need to have your cholesterol monitored frequently and be treated with medication.   Ask if you should have a cardiac stress test if your history suggests this. A stress test is a test done on a treadmill that looks for heart disease. This test can find disease prior to there being a problem.  Menopause can be associated with physical symptoms and risks. Hormone replacement therapy is available to decrease these. You should talk to your caregiver about whether starting or continuing to take hormones is right for you.   Osteoporosis is a disease in which the bones lose minerals and strength as we age. This can result in serious bone fractures.  Risk of osteoporosis can be identified using a bone density scan. Women ages 39 and over should discuss this with their caregivers, as should women after menopause who have other risk factors. Ask your caregiver whether you should be taking a calcium supplement and Vitamin D, to reduce the rate of osteoporosis.   Avoid drinking alcohol in excess (more than two drinks per day).  Avoid use of street drugs. Do not share needles with anyone. Ask for professional help if you need assistance or instructions on stopping the use of alcohol, cigarettes, and/or drugs.  Brush your teeth twice a day with fluoride toothpaste, and floss once a day. Good oral hygiene prevents tooth decay and gum disease. The problems can be painful, unattractive, and can cause other health problems. Visit your dentist for a  routine oral and dental check up and preventive care every 6-12 months.   Look at your skin regularly.  Use a mirror to look at your back. Notify your caregivers of changes in moles, especially if there are changes in shapes, colors, a size larger than a pencil eraser, an irregular border, or development of new moles.  Safety:  Use seatbelts 100% of the time, whether driving or as a passenger.  Use safety devices such as hearing protection if you work in environments with loud noise or significant background noise.  Use safety glasses when doing any work that could send debris in to the eyes.  Use a helmet if you ride a bike or motorcycle.  Use appropriate safety gear for contact sports.  Talk to your caregiver about gun safety.  Use sunscreen with a SPF (or skin protection factor) of 15 or greater.  Lighter skinned people are at a greater risk of skin cancer. Don't forget to also wear sunglasses in order to protect your eyes from too much damaging sunlight. Damaging sunlight can accelerate cataract formation.   Practice safe sex. Use condoms. Condoms are used for birth control and to help reduce the spread of sexually transmitted infections (or STIs).  Some of the STIs are gonorrhea (the clap), chlamydia, syphilis, trichomonas, herpes, HPV (human papilloma virus) and HIV (human immunodeficiency virus) which causes AIDS. The herpes, HIV and HPV are viral illnesses that have no cure. These can result in disability, cancer and death.   Keep carbon monoxide and smoke detectors in your home functioning at all times. Change the batteries every 6 months or use a model that plugs into the wall.   Vaccinations:  Stay up to date with your tetanus shots and other required immunizations. You should have a booster for tetanus every 10 years. Be sure to get your flu shot every year, since 5%-20% of the [place] population comes down with the flu. The flu vaccine changes each year, so being vaccinated once is not  enough. Get your shot in the fall, before the flu season peaks.   Other vaccines to consider:  Human Papilloma Virus or HPV causes cancer of the cervix, and other infections that can be transmitted from person to person. There is a vaccine for HPV, and females should get immunized between the ages of 25 and 61. It requires a series of 3 shots.   Pneumococcal vaccine to protect against certain types of pneumonia.  This is normally recommended for adults age 91 or older.  However, adults younger than 51 years old with certain underlying conditions such as diabetes, heart or lung disease should also receive the vaccine.  Shingles vaccine to protect against Varicella  Zoster if you are older than age 34, or younger than 51 years old with certain underlying illness.  Hepatitis A vaccine to protect against a form of infection of the liver by a virus acquired from food.  Hepatitis B vaccine to protect against a form of infection of the liver by a virus acquired from blood or body fluids, particularly if you work in health care.  If you plan to travel internationally, check with your local health department for specific vaccination recommendations.  Cancer Screening:  Breast cancer screening is essential to preventive care for women. All women age 57 and older should perform a breast self-exam every month. At age 19 and older, women should have their caregiver complete a breast exam each year. Women at ages 46 and older should have a mammogram (x-ray film) of the breasts. Your caregiver can discuss how often you need mammograms.    Cervical cancer screening includes taking a Pap smear (sample of cells examined under a microscope) from the cervix (end of the uterus). It also includes testing for HPV (Human Papilloma Virus, which can cause cervical cancer). Screening and a pelvic exam should begin at age 61, or 3 years after a woman becomes sexually active. Screening should occur every year, with a Pap smear  but no HPV testing, up to age 72. After age 9, you should have a Pap smear every 3 years with HPV testing, if no HPV was found previously.   Most routine colon cancer screening begins at the age of 61. On a yearly basis, doctors may provide special easy to use take-home tests to check for hidden blood in the stool. Sigmoidoscopy or colonoscopy can detect the earliest forms of colon cancer and is life saving. These tests use a small camera at the end of a tube to directly examine the colon. Speak to your caregiver about this at age 57, when routine screening begins (and is repeated every 5 years unless early forms of pre-cancerous polyps or small growths are found).

## 2015-01-10 NOTE — Progress Notes (Signed)
Patient ID: Donald Schwartz, male   DOB: 1966/03/13, 49 y.o.   MRN: [id number]  Annual Comprehensive Examination  This very nice 49 y.o. MWM presents for complete physical.  Patient has been followed for HTN, Prediabetes, Hyperlipidemia, and Vitamin D Deficiency.    Patient also presents with a 2-3 month hx/o Right head/eye pains intermittent to constant - aching to [place]/stabbing. Areas of hyperpathia to very slight stimulation around the right face. Patient had recent negative full eye exam by Dr Delman Cheadle.   Other problems include chronic neck pain predating since a C5-6 ACDF in Dec 2011 by Dr Arnoldo Morale.   HTN predates since 2002. Patient's BP has been controlled at home.Today's BP: 124/64 mmHg. Patient denies any cardiac symptoms as chest pain, palpitations, shortness of breath, dizziness or ankle swelling.   Patient's hyperlipidemia is controlled with diet and medications. Patient denies myalgias or other medication SE's. Last lipids were at goal - Total Chol 152; HDL 31; LDL 89; Trig 159 on 10/10/2014.   Patient has prediabetes since 2008 with an A1c 6.3%  and patient denies reactive hypoglycemic symptoms, visual blurring, diabetic polys or paresthesias. Last A1c was  5.9% on 10/10/2014.      Finally, patient has history of Vitamin D Deficiency of 31 in 2008 and last vitamin D was 54 on 10/10/2014.     Medication Sig  . aspirin 81 MG chewable tablet Chew by mouth daily.  Marland Kitchen diltiazem (CARDIZEM) 120 MG tablet TAKE ONE TABLET BY MOUTH ONCE DAILY  . fluticasone (FLONASE) 50 MCG/ACT nasal spray USE ONE SPRAY EACH NOSTRIL TWICE DAILY  . isometheptene-acetaminophen-dichloralphenazone (MIDRIN) 65-325-100 MG capsule Take 1 capsule by mouth 4 (four) times daily as needed for migraine. Maximum 5 capsules in 12 hours for migraine headaches, 8 capsules in 24 hours for tension headaches.  Marland Kitchen lisinopril  40 MG tablet 1/2- 1 pill daily  . Magnesium 250 MG TABS Take 250 mg by mouth daily.  Marland Kitchen FISH OIL 1000 MG CAPS  Take by mouth 4 (four) times daily.  . Red Yeast Rice Extract  Take 1,200 mg by mouth 2 (two) times daily.   Marland Kitchen DEPO-TESTOTERONE 200 MG/ML inj INJECT 2 cc IM EVERY 2 WEEKS  . valACYclovir  1000 MG tablet Take 1 tablet (1,000 mg total) by mouth daily.  Marland Kitchen VITAMIN D Take 5,000 Int'l Units/day by mouth.   Allergies  Allergen Reactions  . Toprol Xl [Metoprolol Tartrate]     ED   Past Medical History  Diagnosis Date  . Hypertension   . Vitamin D deficiency   . Hypogonadism male   . Elevated LDL cholesterol level   . Elevated hemoglobin A1c   . Hx of colonic polyp 11/20/2014   Health Maintenance  Topic Date Due  . INFLUENZA VACCINE  04/21/2015  . TETANUS/TDAP  09/21/2015  . COLONOSCOPY  11/13/2017  . HIV Screening  Completed   Immunization History  Administered Date(s) Administered  . Influenza Split 07/10/2014  . PPD Test 01/07/2014  . Pneumococcal-Unspecified 09/20/2009  . Td 09/20/2005   Past Surgical History  Procedure Laterality Date  . Hip fracture surgery Right 1986    avulsion/chip fracture ?stress fracture  . Patella fracture surgery Right 1997  . C5-6 fusion     Family History  Problem Relation Age of Onset  . Colon cancer Maternal Uncle   . Colon cancer Paternal Uncle   . Colon cancer Maternal Grandfather   . Colon cancer Paternal Grandfather    History   Social  History  . Marital Status: Married    Spouse Name: N/A  . Number of Children: N/A  . Years of Education: N/A   Occupational History  . Not on file.   Social History Main Topics  . Smoking status: Former Smoker    Quit date: 09/20/1989  . Smokeless tobacco: Never Used  . Alcohol Use: No  . Drug Use: No  . Sexual Activity: Not on file   Other Topics Concern  . Not on file   Social History Narrative    ROS Constitutional: Denies fever, chills, weight loss/gain, headaches, insomnia,  night sweats or change in appetite. Does c/o fatigue. Eyes: Denies redness, blurred vision, diplopia,  discharge, itchy or watery eyes.  ENT: Denies discharge, congestion, post nasal drip, epistaxis, sore throat, earache, hearing loss, dental pain, Tinnitus, Vertigo, Sinus pain or snoring.  Cardio: Denies chest pain, palpitations, irregular heartbeat, syncope, dyspnea, diaphoresis, orthopnea, PND, claudication or edema Respiratory: denies cough, dyspnea, DOE, pleurisy, hoarseness, laryngitis or wheezing.  Gastrointestinal: Denies dysphagia, heartburn, reflux, water brash, pain, cramps, nausea, vomiting, bloating, diarrhea, constipation, hematemesis, melena, hematochezia, jaundice or hemorrhoids Genitourinary: Denies dysuria, frequency, urgency, nocturia, hesitancy, discharge, hematuria or flank pain Musculoskeletal: Denies arthralgia, myalgia, stiffness, Jt. Swelling, pain, limp or strain/sprain. Denies Falls. Skin: Denies puritis, rash, hives, warts, acne, eczema or change in skin lesion Neuro: No weakness, tremor, incoordination. Psychiatric: Denies confusion, memory loss or sensory loss. Denies Depression. Endocrine: Denies change in weight, skin, hair change, nocturia, and paresthesia, diabetic polys, visual blurring or hyper / hypo glycemic episodes.  Heme/Lymph: No excessive bleeding, bruising or enlarged lymph nodes.  Physical Exam  BP 124/64 mmHg  Pulse 70  Temp(Src) 98.4 F (36.9 C) (Temporal)  Resp 18  Ht 6' 0.25" (1.835 m)  Wt 247 lb (112.038 kg)  BMI 33.27 kg/m2  General Appearance: Well nourished, in no apparent distress.  Eyes: PERRLA, EOMs, conjunctiva no swelling or erythema, normal fundi and vessels. Sinuses: No frontal/maxillary tenderness ENT/Mouth: EACs patent / TMs  nl. Nares clear without erythema, swelling, mucoid exudates. Oral hygiene is good. No erythema, swelling, or exudate. Tongue normal, non-obstructing. Tonsils not swollen or erythematous. Hearing normal.  Neck: Supple, thyroid normal. No bruits, nodes or JVD. Respiratory: Respiratory effort normal.  BS  equal and clear bilateral without rales, rhonci, wheezing or stridor. Cardio: Heart sounds are normal with regular rate and rhythm and no murmurs, rubs or gallops. Peripheral pulses are normal and equal bilaterally without edema. No aortic or femoral bruits. Chest: symmetric with normal excursions and percussion.  Abdomen: Flat, soft, with bowl sounds. Nontender, no guarding, rebound, hernias, masses, or organomegaly.  Lymphatics: Non tender without lymphadenopathy.  Genitourinary: No hernias.Testes nl. DRE - prostate nl for age - smooth & firm w/o nodules. Musculoskeletal: Full ROM all peripheral extremities, joint stability, 5/5 strength, and normal gait. Skin: Warm and dry without rashes, lesions, cyanosis, clubbing or  ecchymosis.  Neuro: Cranial nerves intact, reflexes equal bilaterally.  (+) hyperpathia right face to light touch. Normal muscle tone, no cerebellar symptoms. Sensorimotor generally intact.Marland Kitchen  Pysch: Awake and oriented X 3 with normal affect, insight and judgment appropriate.   Assessment and Plan  1. Essential hypertension  - Microalbumin / creatinine urine ratio - EKG 12-Lead - [place], RETROPERITNL ABD,  LTD - TSH  2. Hyperlipidemia  - Lipid panel  3. PreDiabetes - Hemoglobin A1c - Insulin, random  4. Vitamin D deficiency  - Vit D  25 hydroxy (rtn osteoporosis monitoring)  5. Testosterone Deficiency  6. CKD (chronic kidney disease) stage 2, GFR 60-89 ml/min   7. OSA on CPAP   8. Screening for rectal cancer  - POC Hemoccult Bld/Stl (3-Cd Home Screen); Future  9. Prostate cancer screening  - PSA  10. Other fatigue  - Vitamin B12 - Testosterone - Iron and TIBC  11. Trigeminal neuralgia of right side of face  - pregabalin (LYRICA) 100 MG capsule; Take 1 capsule 3 x day for facial pain  Dispense: 90 capsule; Refill: 5 - Ambulatory referral to Neurology  12. Medication management  - Urine Microscopic - CBC with Differential/Platelet - BASIC  METABOLIC PANEL WITH GFR - Hepatic function panel - Magnesium     Continue prudent diet as discussed, weight control, BP monitoring, regular exercise, and medications as discussed.  Discussed med effects and SE's. Routine screening labs and tests as requested with regular follow-up as recommended. Over 40 minutes of exam, counseling &  chart review was performed

## 2015-01-11 LAB — URINALYSIS, MICROSCOPIC ONLY
Bacteria, UA: NONE SEEN
CASTS: NONE SEEN
Crystals: NONE SEEN
Squamous Epithelial / LPF: NONE SEEN

## 2015-01-11 LAB — MICROALBUMIN / CREATININE URINE RATIO
CREATININE, URINE: 131.5 mg/dL
MICROALB UR: 0.8 mg/dL (ref <2.0)
MICROALB/CREAT RATIO: 6.1 mg/g (ref 0.0–30.0)

## 2015-01-11 LAB — INSULIN, RANDOM: Insulin: 32 u[IU]/mL — ABNORMAL HIGH (ref 2.0–19.6)

## 2015-01-11 LAB — TESTOSTERONE: TESTOSTERONE: 660 ng/dL (ref 300–890)

## 2015-01-11 LAB — PSA: PSA: 0.73 ng/mL (ref <=4.00)

## 2015-01-11 LAB — VITAMIN D 25 HYDROXY (VIT D DEFICIENCY, FRACTURES): VIT D 25 HYDROXY: 70 ng/mL (ref 30–100)

## 2015-01-29 ENCOUNTER — Other Ambulatory Visit: Payer: Self-pay | Admitting: Physician Assistant

## 2015-02-24 ENCOUNTER — Encounter: Payer: Self-pay | Admitting: Neurology

## 2015-02-24 ENCOUNTER — Ambulatory Visit (INDEPENDENT_AMBULATORY_CARE_PROVIDER_SITE_OTHER): Payer: BC Managed Care – PPO | Admitting: Neurology

## 2015-02-24 VITALS — BP 124/68 | HR 87 | Ht 72.5 in | Wt 253.4 lb

## 2015-02-24 DIAGNOSIS — G500 Trigeminal neuralgia: Secondary | ICD-10-CM | POA: Diagnosis not present

## 2015-02-24 MED ORDER — OXCARBAZEPINE 300 MG PO TABS: ORAL_TABLET | ORAL | Status: DC

## 2015-02-24 NOTE — Progress Notes (Signed)
NEUROLOGY CONSULTATION NOTE  Donald Schwartz MRN: [id number] DOB: [birth year]  Referring provider: Dr. Melford Aase Primary care provider: Dr. Melford Aase  Reason for consult:  Trigeminal neuralgia  HISTORY OF PRESENT ILLNESS: Donald Schwartz is a 49 year old right-handed man with hypertension, hyperlipidemia  who presents for right-sided trigeminal neuralgia.  Records and labs reviewed.  Beginning in January, he started having pain involving the right side of the face.  It started in the right eye, then involving the right top of head, right side of nose and right upper teeth.  It is a shooting pain.  It is triggered by lightly touching his cheek, eating or the feeling of water running over his head.  Sometimes there is a slight tingling but usually not.  There is no numbness or facial weakness.  He was evaluated by the eye doctor and dentist, with normal exams.    He was on prednisone, and later on Lyrica 100mg  three times daily, which he had previously taken for post-traumatic left sided cervicogenic headache.  The Lyrica helps with the pain involving the nose.  For his headaches, he has tried gabapentin, but had side effects.  Recent Hgb A1c was 6.  Na level from April was 132.  PAST MEDICAL HISTORY: Past Medical History  Diagnosis Date  . Hypertension   . Vitamin D deficiency   . Hypogonadism male   . Elevated LDL cholesterol level   . Elevated hemoglobin A1c   . Hx of colonic polyp 11/20/2014    PAST SURGICAL HISTORY: Past Surgical History  Procedure Laterality Date  . Hip fracture surgery Right 1986    avulsion/chip fracture ?stress fracture  . Patella fracture surgery Right 1997  . C5-6 fusion      MEDICATIONS: Current Outpatient Prescriptions on File Prior to Visit  Medication Sig Dispense Refill  . aspirin 81 MG chewable tablet Chew by mouth daily.    Marland Kitchen diltiazem (CARDIZEM) 120 MG tablet TAKE ONE TABLET BY MOUTH ONCE DAILY 90 tablet 1  . fluticasone (FLONASE) 50 MCG/ACT  nasal spray USE ONE SPRAY EACH NOSTRIL TWICE DAILY 16 g 3  . isometheptene-acetaminophen-dichloralphenazone (MIDRIN) 65-325-100 MG capsule Take 1 capsule by mouth 4 (four) times daily as needed for migraine. Maximum 5 capsules in 12 hours for migraine headaches, 8 capsules in 24 hours for tension headaches.    Marland Kitchen lisinopril (PRINIVIL,ZESTRIL) 40 MG tablet TAKE ONE-HALF TO ONE TABLET BY MOUTH ONCE DAILY 90 tablet 1  . Magnesium 250 MG TABS Take 250 mg by mouth daily.    . Omega-3 Fatty Acids (FISH OIL) 1000 MG CAPS Take by mouth 4 (four) times daily.    . pregabalin (LYRICA) 100 MG capsule Take 1 capsule 3 x day for facial pain 90 capsule 5  . Red Yeast Rice Extract (RED YEAST RICE PO) Take 1,200 mg by mouth 2 (two) times daily.     Marland Kitchen testosterone cypionate (DEPOTESTOTERONE CYPIONATE) 200 MG/ML injection INJECT 2 MLS INTRAMUSCULARLY EVERY 2 WEEKS 10 mL 0  . valACYclovir (VALTREX) 1000 MG tablet Take 1 tablet (1,000 mg total) by mouth daily. 30 tablet 1  . VITAMIN D, ERGOCALCIFEROL, PO Take 5,000 Int'l Units/day by mouth.     No current facility-administered medications on file prior to visit.    ALLERGIES: Allergies  Allergen Reactions  . Toprol Xl [Metoprolol Tartrate]     ED    FAMILY HISTORY: Family History  Problem Relation Age of Onset  . Colon cancer Maternal Uncle   . Colon  cancer Paternal Uncle   . Colon cancer Maternal Grandfather   . Colon cancer Paternal Grandfather     SOCIAL HISTORY: History   Social History  . Marital Status: Married    Spouse Name: N/A  . Number of Children: N/A  . Years of Education: N/A   Occupational History  . Not on file.   Social History Main Topics  . Smoking status: Former Smoker    Quit date: 09/20/1989  . Smokeless tobacco: Never Used  . Alcohol Use: No  . Drug Use: No  . Sexual Activity: Not on file   Other Topics Concern  . Not on file   Social History Narrative    REVIEW OF SYSTEMS: Constitutional: No fevers, chills,  or sweats, no generalized fatigue, change in appetite Eyes: No visual changes, double vision, eye pain Ear, nose and throat: No hearing loss, ear pain, nasal congestion, sore throat Cardiovascular: No chest pain, palpitations Respiratory:  No shortness of breath at rest or with exertion, wheezes GastrointestinaI: No nausea, vomiting, diarrhea, abdominal pain, fecal incontinence Genitourinary:  No dysuria, urinary retention or frequency Musculoskeletal:  No neck pain, back pain Integumentary: No rash, pruritus, skin lesions Neurological: as above Psychiatric: No depression, insomnia, anxiety Endocrine: No palpitations, fatigue, diaphoresis, mood swings, change in appetite, change in weight, increased thirst Hematologic/Lymphatic:  No anemia, purpura, petechiae. Allergic/Immunologic: no itchy/runny eyes, nasal congestion, recent allergic reactions, rashes  PHYSICAL EXAM: Filed Vitals:   02/24/15 1412  BP: 124/68  Pulse: 87   General: No acute distress Head:  Normocephalic/atraumatic Eyes:  fundi unremarkable, without vessel changes, exudates, hemorrhages or papilledema. Neck: supple, no paraspinal tenderness, full range of motion Back: No paraspinal tenderness Heart: regular rate and rhythm Lungs: Clear to auscultation bilaterally. Vascular: No carotid bruits. Neurological Exam: Mental status: alert and oriented to person, place, and time, recent and remote memory intact, fund of knowledge intact, attention and concentration intact, speech fluent and not dysarthric, language intact. Cranial nerves: CN I: not tested CN II: pupils equal, round and reactive to light, visual fields intact, fundi unremarkable, without vessel changes, exudates, hemorrhages or papilledema. CN III, IV, VI:  full range of motion, no nystagmus, no ptosis CN V: facial sensation intact CN VII: upper and lower face symmetric CN VIII: hearing intact CN IX, X: gag intact, uvula midline CN XI:  sternocleidomastoid and trapezius muscles intact CN XII: tongue midline Bulk & Tone: normal, no fasciculations. Motor:  5/5 throughout Sensation:  Temperature and vibration intact Deep Tendon Reflexes:  2+ throughout, toes downgoing Finger to nose testing:  No dysmetria Heel to shin:  No dysmetria Gait:  Normal station and stride.  Able to turn and walk in tandem. Romberg negative.  IMPRESSION: Right-sided trigeminal neuralgia  PLAN: 1.  Will start oxcarbazepine 150mg  twice daily for 7 days, then increasing to 300mg  twice daily.  He is scheduled to have labs, including Na, drawn at the end of July. 2.  He will continue Lyrica for now, since it has been partially helpful.  Goal would be to eventually taper off.  He doesn't like taking Lyrica as it has caused weight gain. 3.  Follow up in 2 months.  He should call in 2-3 weeks with update.  Thank you for allowing me to take part in the care of this patient.  Metta Clines, DO  CC:  [name], MD

## 2015-02-24 NOTE — Patient Instructions (Signed)
1.  For the trigeminal nerve pain, we will start oxcarbazepine 300mg  tablets.  Take 1/2 tablet twice daily for 7 days, then 1 tablet twice daily.  Call in 2-3 weeks with update. 2.  Continue the Lyrica for now. 3.  Follow up in  2 months.

## 2015-02-27 ENCOUNTER — Other Ambulatory Visit: Payer: Self-pay | Admitting: Physician Assistant

## 2015-02-28 MED ORDER — TESTOSTERONE CYPIONATE 200 MG/ML IM SOLN: INTRAMUSCULAR | Status: DC

## 2015-02-28 NOTE — Addendum Note (Signed)
Addended by: Vicie Mutters R on: 02/28/2015 01:36 PM   Modules accepted: Orders

## 2015-03-14 ENCOUNTER — Other Ambulatory Visit: Payer: Self-pay | Admitting: Internal Medicine

## 2015-03-14 DIAGNOSIS — G500 Trigeminal neuralgia: Secondary | ICD-10-CM

## 2015-03-14 MED ORDER — GABAPENTIN 100 MG PO CAPS: ORAL_CAPSULE | ORAL | Status: DC

## 2015-03-26 ENCOUNTER — Telehealth: Payer: Self-pay | Admitting: *Deleted

## 2015-03-26 ENCOUNTER — Other Ambulatory Visit: Payer: Self-pay | Admitting: Neurology

## 2015-03-26 NOTE — Telephone Encounter (Signed)
Patient is aware he can decrease Lyrica

## 2015-03-26 NOTE — Telephone Encounter (Signed)
Yes.  He can go down on the Lyrica to 100mg  twice daily for 5 days, then 100mg  daily for 5 days, then stop.

## 2015-03-26 NOTE — Telephone Encounter (Signed)
Patient called he states  he is doing ok with the Trileptal 300 mg and is wondering if he can stop the lyrica ? Please advise

## 2015-04-09 ENCOUNTER — Other Ambulatory Visit: Payer: Self-pay | Admitting: Internal Medicine

## 2015-04-18 ENCOUNTER — Ambulatory Visit (INDEPENDENT_AMBULATORY_CARE_PROVIDER_SITE_OTHER): Payer: BC Managed Care – PPO | Admitting: Internal Medicine

## 2015-04-18 ENCOUNTER — Encounter: Payer: Self-pay | Admitting: Internal Medicine

## 2015-04-18 VITALS — BP 118/68 | HR 70 | Temp 98.0°F | Resp 18 | Ht 72.25 in | Wt 258.0 lb

## 2015-04-18 DIAGNOSIS — R7309 Other abnormal glucose: Secondary | ICD-10-CM

## 2015-04-18 DIAGNOSIS — E785 Hyperlipidemia, unspecified: Secondary | ICD-10-CM

## 2015-04-18 DIAGNOSIS — E559 Vitamin D deficiency, unspecified: Secondary | ICD-10-CM

## 2015-04-18 DIAGNOSIS — I10 Essential (primary) hypertension: Secondary | ICD-10-CM

## 2015-04-18 DIAGNOSIS — Z79899 Other long term (current) drug therapy: Secondary | ICD-10-CM

## 2015-04-18 LAB — CBC WITH DIFFERENTIAL/PLATELET
Basophils Absolute: 0.1 10*3/uL (ref 0.0–0.1)
Basophils Relative: 1 % (ref 0–1)
Eosinophils Absolute: 0.1 10*3/uL (ref 0.0–0.7)
Eosinophils Relative: 2 % (ref 0–5)
HCT: 48.7 % (ref 39.0–52.0)
HEMOGLOBIN: 16.7 g/dL (ref 13.0–17.0)
Lymphocytes Relative: 27 % (ref 12–46)
Lymphs Abs: 1.6 10*3/uL (ref 0.7–4.0)
MCH: 30.4 pg (ref 26.0–34.0)
MCHC: 34.3 g/dL (ref 30.0–36.0)
MCV: 88.7 fL (ref 78.0–100.0)
MONO ABS: 0.7 10*3/uL (ref 0.1–1.0)
MPV: 8.9 fL (ref 8.6–12.4)
Monocytes Relative: 12 % (ref 3–12)
NEUTROS PCT: 58 % (ref 43–77)
Neutro Abs: 3.4 10*3/uL (ref 1.7–7.7)
Platelets: 254 10*3/uL (ref 150–400)
RBC: 5.49 MIL/uL (ref 4.22–5.81)
RDW: 13.9 % (ref 11.5–15.5)
WBC: 5.9 10*3/uL (ref 4.0–10.5)

## 2015-04-18 LAB — LIPID PANEL
Cholesterol: 142 mg/dL (ref 125–200)
HDL: 34 mg/dL — AB (ref >=40)
LDL Cholesterol: 90 mg/dL (ref <130)
TRIGLYCERIDES: 89 mg/dL (ref <150)
Total CHOL/HDL Ratio: 4.2 Ratio (ref <=5.0)
VLDL: 18 mg/dL (ref <30)

## 2015-04-18 LAB — HEPATIC FUNCTION PANEL
ALT: 33 U/L (ref 9–46)
AST: 33 U/L (ref 10–35)
Albumin: 4.5 g/dL (ref 3.6–5.1)
Alkaline Phosphatase: 43 U/L (ref 40–115)
BILIRUBIN DIRECT: 0.1 mg/dL (ref <=0.2)
Indirect Bilirubin: 0.5 mg/dL (ref 0.2–1.2)
TOTAL PROTEIN: 7.4 g/dL (ref 6.1–8.1)
Total Bilirubin: 0.6 mg/dL (ref 0.2–1.2)

## 2015-04-18 LAB — TSH: TSH: 1.468 u[IU]/mL (ref 0.350–4.500)

## 2015-04-18 LAB — BASIC METABOLIC PANEL WITH GFR
BUN: 9 mg/dL (ref 7–25)
CHLORIDE: 102 mmol/L (ref 98–110)
CO2: 24 mmol/L (ref 20–31)
Calcium: 9.3 mg/dL (ref 8.6–10.3)
Creat: 1.13 mg/dL (ref 0.70–1.33)
GFR, Est African American: 87 mL/min (ref >=60)
GFR, Est Non African American: 75 mL/min (ref >=60)
Glucose, Bld: 96 mg/dL (ref 65–99)
Potassium: 4.2 mmol/L (ref 3.5–5.3)
SODIUM: 136 mmol/L (ref 135–146)

## 2015-04-18 LAB — MAGNESIUM: Magnesium: 2.2 mg/dL (ref 1.5–2.5)

## 2015-04-18 LAB — HEMOGLOBIN A1C
HEMOGLOBIN A1C: 5.6 % (ref <5.7)
MEAN PLASMA GLUCOSE: 114 mg/dL (ref <117)

## 2015-04-18 MED ORDER — LOSARTAN POTASSIUM 25 MG PO TABS: 25.0000 mg | ORAL_TABLET | Freq: Every day | ORAL | Status: DC

## 2015-04-18 NOTE — Progress Notes (Signed)
Patient ID: STYLIANOS STRADLING, male   DOB: May 25, 1966, 49 y.o.   MRN: [id number]  Assessment and Plan:  Hypertension:  -Stop lisinopril and start 25 mg losartan due to dry cough -monitor blood pressure at home.  -Continue DASH diet.   -Reminder to go to the ER if any CP, SOB, nausea, dizziness, severe HA, changes vision/speech, left arm numbness and tingling, and jaw pain.  Cholesterol: -Continue diet and exercise.  -Check cholesterol.   Pre-diabetes: -Continue diet and exercise.  -Check A1C  Vitamin D Def: -check level -continue medications.   Obesity -discussed weight gain and diet and exercise -patient has returned to exercise and weight gain likely related to lyrica use    Continue diet and meds as discussed. Further disposition pending results of labs.  HPI 49 y.o. male  presents for 3 month follow up with hypertension, hyperlipidemia, prediabetes and vitamin D.   His blood pressure has been controlled at home, today their BP is BP: 118/68 mmHg.   He does workout. He denies chest pain, shortness of breath, dizziness.  He has been going 2-3 times a week to the gym to get his exercise in.     He is on cholesterol medication and denies myalgias. His cholesterol is at goal. The cholesterol last visit was:   Lab Results  Component Value Date   CHOL 137 01/10/2015   HDL 30* 01/10/2015   LDLCALC 88 01/10/2015   TRIG 93 01/10/2015   CHOLHDL 4.6 01/10/2015     He has been working on diet and exercise for prediabetes, and denies foot ulcerations, hyperglycemia, hypoglycemia , increased appetite, nausea, paresthesia of the feet, polydipsia, polyuria, visual disturbances, vomiting and weight loss. Last A1C in the office was:  Lab Results  Component Value Date   HGBA1C 6.0* 01/10/2015    Patient is on Vitamin D supplement.  Lab Results  Component Value Date   VD25OH 64 01/10/2015     He reports that his pain has been doing much better since he started the oxycarbamazepine  which has signficantly decreased his pain.  He has been following with Dr. Tomi Likens and he reports that he has had a 90% reduction in his pain.  He reports that he has not been working on his diet.     Current Medications:  Current Outpatient Prescriptions on File Prior to Visit  Medication Sig Dispense Refill  . aspirin 81 MG chewable tablet Chew by mouth daily.    Marland Kitchen diltiazem (CARDIZEM) 120 MG tablet TAKE ONE TABLET BY MOUTH ONCE DAILY 90 tablet 1  . fluticasone (FLONASE) 50 MCG/ACT nasal spray USE ONE SPRAY EACH NOSTRIL TWICE DAILY 16 g 3  . isometheptene-acetaminophen-dichloralphenazone (MIDRIN) 65-325-100 MG capsule Take 1 capsule by mouth 4 (four) times daily as needed for migraine. Maximum 5 capsules in 12 hours for migraine headaches, 8 capsules in 24 hours for tension headaches.    Marland Kitchen lisinopril (PRINIVIL,ZESTRIL) 40 MG tablet TAKE ONE-HALF TO ONE TABLET BY MOUTH ONCE DAILY 90 tablet 1  . Magnesium 250 MG TABS Take 250 mg by mouth daily.    . Omega-3 Fatty Acids (FISH OIL) 1000 MG CAPS Take by mouth 4 (four) times daily.    . Oxcarbazepine (TRILEPTAL) 300 MG tablet TAKE 1/2 TABLET BY MOUTH 2 TIMES DAILY FOR 7 DAYS, THEN 1 TABLET 2 TIMES DAILY 60 tablet 0  . Red Yeast Rice Extract (RED YEAST RICE PO) Take 1,200 mg by mouth 2 (two) times daily.     Marland Kitchen testosterone  cypionate (DEPOTESTOTERONE CYPIONATE) 200 MG/ML injection Inject 2 cc IM every 2 weeks or as directed by your doctor 10 mL 1  . valACYclovir (VALTREX) 1000 MG tablet Take 1 tablet (1,000 mg total) by mouth daily. 30 tablet 1  . VITAMIN D, ERGOCALCIFEROL, PO Take 5,000 Int'l Units/day by mouth.     No current facility-administered medications on file prior to visit.    Medical History:  Past Medical History  Diagnosis Date  . Hypertension   . Vitamin D deficiency   . Hypogonadism male   . Elevated LDL cholesterol level   . Elevated hemoglobin A1c   . Hx of colonic polyp 11/20/2014    Allergies:  Allergies  Allergen  Reactions  . Toprol Xl [Metoprolol Tartrate]     ED     Review of Systems:  Review of Systems  Constitutional: Negative for fever, chills and malaise/fatigue.  HENT: Negative for congestion, ear pain and sore throat.   Eyes: Negative.   Respiratory: Positive for cough (dry cough). Negative for shortness of breath and wheezing.   Cardiovascular: Negative for chest pain, palpitations and leg swelling.  Gastrointestinal: Negative for heartburn, diarrhea, constipation, blood in stool and melena.  Genitourinary: Negative.   Skin: Negative.   Neurological: Negative for dizziness, sensory change, loss of consciousness and headaches.  Psychiatric/Behavioral: Negative for depression. The patient is not nervous/anxious and does not have insomnia.     Family history- Review and unchanged  Social history- Review and unchanged  Physical Exam: BP 118/68 mmHg  Pulse 70  Temp(Src) 98 F (36.7 C) (Temporal)  Resp 18  Ht 6' 0.25" (1.835 m)  Wt 258 lb (117.028 kg)  BMI 34.76 kg/m2 Wt Readings from Last 3 Encounters:  04/18/15 258 lb (117.028 kg)  02/24/15 253 lb 6.4 oz (114.941 kg)  01/10/15 247 lb (112.038 kg)    General Appearance: Well nourished well developed, in no apparent distress. Eyes: PERRLA, EOMs, conjunctiva no swelling or erythema ENT/Mouth: Ear canals normal without obstruction, swelling, erythma, discharge.  TMs normal bilaterally.  Oropharynx moist, clear, without exudate, or postoropharyngeal swelling. Neck: Supple, thyroid normal,no cervical adenopathy  Respiratory: Respiratory effort normal, Breath sounds clear A&P without rhonchi, wheeze, or rale.  No retractions, no accessory usage. Cardio: RRR with no MRGs. Brisk peripheral pulses without edema.  Abdomen: Soft, + BS,  Non tender, no guarding, rebound, hernias, masses. Musculoskeletal: Full ROM, 5/5 strength, Normal gait Skin: Warm, dry without rashes, lesions, ecchymosis.  Neuro: Awake and oriented X 3, Cranial  nerves intact. Normal muscle tone, no cerebellar symptoms. Psych: Normal affect, Insight and Judgment appropriate.    Starlyn Skeans, PA-C 11:06 AM [facility]

## 2015-04-18 NOTE — Patient Instructions (Signed)
Losartan tablets  What is this medicine?  LOSARTAN (loe SAR tan) is used to treat high blood pressure and to reduce the risk of stroke in certain patients. This drug also slows the progression of kidney disease in patients with diabetes.  This medicine may be used for other purposes; ask your health care provider or pharmacist if you have questions.  COMMON BRAND NAME(S): Cozaar  What should I tell my health care provider before I take this medicine?  They need to know if you have any of these conditions:  -heart failure  -kidney or liver disease  -an unusual or allergic reaction to losartan, other medicines, foods, dyes, or preservatives  -pregnant or trying to get pregnant  -breast-feeding  How should I use this medicine?  Take this medicine by mouth with a glass of water. Follow the directions on the prescription label. This medicine can be taken with or without food. Take your doses at regular intervals. Do not take your medicine more often than directed.  Talk to your pediatrician regarding the use of this medicine in children. Special care may be needed.  Overdosage: If you think you have taken too much of this medicine contact a poison control center or emergency room at once.  NOTE: This medicine is only for you. Do not share this medicine with others.  What if I miss a dose?  If you miss a dose, take it as soon as you can. If it is almost time for your next dose, take only that dose. Do not take double or extra doses.  What may interact with this medicine?  -blood pressure medicines  -diuretics, especially triamterene, spironolactone, or amiloride  -fluconazole  -NSAIDs, medicines for pain and inflammation, like ibuprofen or naproxen  -potassium salts or potassium supplements  -rifampin  This list may not describe all possible interactions. Give your health care provider a list of all the medicines, herbs, non-prescription drugs, or dietary supplements you use. Also tell them if you smoke, drink alcohol, or  use illegal drugs. Some items may interact with your medicine.  What should I watch for while using this medicine?  Visit your doctor or health care professional for regular checks on your progress. Check your blood pressure as directed. Ask your doctor or health care professional what your blood pressure should be and when you should contact him or her. Call your doctor or health care professional if you notice an irregular or fast heart beat.  Women should inform their doctor if they wish to become pregnant or think they might be pregnant. There is a potential for serious side effects to an unborn child, particularly in the second or third trimester. Talk to your health care professional or pharmacist for more information.  You may get drowsy or dizzy. Do not drive, use machinery, or do anything that needs mental alertness until you know how this drug affects you. Do not stand or sit up quickly, especially if you are an older patient. This reduces the risk of dizzy or fainting spells. Alcohol can make you more drowsy and dizzy. Avoid alcoholic drinks.  Avoid salt substitutes unless you are told otherwise by your doctor or health care professional.  Do not treat yourself for coughs, colds, or pain while you are taking this medicine without asking your doctor or health care professional for advice. Some ingredients may increase your blood pressure.  What side effects may I notice from receiving this medicine?  Side effects that you should report to   irregular heart beat, palpitations, or chest pain -skin rash, itching -swelling of your face, lips, tongue, hands, or feet Side effects that usually do not require medical attention (report to your doctor or health care  professional if they continue or are bothersome): -cough -decreased sexual function or desire -headache -nasal congestion or stuffiness -nausea or stomach pain -sore or cramping muscles This list may not describe all possible side effects. Call your doctor for medical advice about side effects. You may report side effects to FDA at 1-800-FDA-1088. Where should I keep my medicine? Keep out of the reach of children. Store at room temperature between 15 and 30 degrees C (59 and 86 degrees F). Protect from light. Keep container tightly closed. Throw away any unused medicine after the expiration date. NOTE: This sheet is a summary. It may not cover all possible information. If you have questions about this medicine, talk to your doctor, pharmacist, or health care provider.  2015, [employer]vier/Gold Standard. (2007-11-17 16:42:18)

## 2015-04-19 LAB — INSULIN, RANDOM: Insulin: 9.7 u[IU]/mL (ref 2.0–19.6)

## 2015-04-19 LAB — VITAMIN D 25 HYDROXY (VIT D DEFICIENCY, FRACTURES): VIT D 25 HYDROXY: 79 ng/mL (ref 30–100)

## 2015-04-25 ENCOUNTER — Other Ambulatory Visit: Payer: Self-pay | Admitting: Neurology

## 2015-04-25 MED ORDER — OXCARBAZEPINE 300 MG PO TABS: ORAL_TABLET | ORAL | Status: DC

## 2015-05-02 ENCOUNTER — Ambulatory Visit: Payer: Self-pay | Admitting: Neurology

## 2015-05-20 ENCOUNTER — Encounter: Payer: Self-pay | Admitting: Neurology

## 2015-05-20 ENCOUNTER — Ambulatory Visit (INDEPENDENT_AMBULATORY_CARE_PROVIDER_SITE_OTHER): Payer: BC Managed Care – PPO | Admitting: Neurology

## 2015-05-20 VITALS — BP 140/72 | HR 70 | Ht 72.0 in | Wt 264.5 lb

## 2015-05-20 DIAGNOSIS — G500 Trigeminal neuralgia: Secondary | ICD-10-CM | POA: Diagnosis not present

## 2015-05-20 MED ORDER — OXCARBAZEPINE 600 MG PO TABS: 600.0000 mg | ORAL_TABLET | Freq: Two times a day (BID) | ORAL | Status: DC

## 2015-05-20 NOTE — Progress Notes (Signed)
NEUROLOGY FOLLOW UP OFFICE NOTE  SELVIN YUN [id number]  HISTORY OF PRESENT ILLNESS: Donald Schwartz is a 49 year old right-handed man with hypertension, hyperlipidemia and OSA who follows up for right sided trigeminal neuralgia.  UPDATE: He is taking oxcarbazepine 300mg  twice daily in June. Lyrica was discontinued.  Labs from 04/18/15 showed normal CBC and BMP with Na of 136.  Overall, there has been improvement in pain.  He has less allodynia of the cheek and he doesn't have the pain in the tooth as before.  However, over the last 5 days, he began having the shooting pain in the right frontal region down to the right eye.  HISTORY: Beginning in January, he started having pain involving the right side of the face.  It started in the right eye, then involving the right top of head, right side of nose and right upper teeth.  It is a shooting pain.  It is triggered by lightly touching his cheek, eating or the feeling of water running over his head.  Sometimes there is a slight tingling but usually not.  There is no numbness or facial weakness.  He was evaluated by the eye doctor and dentist, with normal exams.    Prior medications:  Lyrica.  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  PAST MEDICAL HISTORY: Past Medical History  Diagnosis Date  . Hypertension   . Vitamin D deficiency   . Hypogonadism male   . Elevated LDL cholesterol level   . Elevated hemoglobin A1c   . Hx of colonic polyp 11/20/2014    MEDICATIONS: Current Outpatient Prescriptions on File Prior to Visit  Medication Sig Dispense Refill  . aspirin 81 MG chewable tablet Chew by mouth daily.    Marland Kitchen diltiazem (CARDIZEM) 120 MG tablet TAKE ONE TABLET BY MOUTH ONCE DAILY 90 tablet 1  . fluticasone (FLONASE) 50 MCG/ACT nasal spray USE ONE SPRAY EACH NOSTRIL TWICE DAILY 16 g 3  . isometheptene-acetaminophen-dichloralphenazone (MIDRIN) 65-325-100 MG capsule Take 1 capsule by mouth 4 (four)  times daily as needed for migraine. Maximum 5 capsules in 12 hours for migraine headaches, 8 capsules in 24 hours for tension headaches.    . losartan (COZAAR) 25 MG tablet Take 1 tablet (25 mg total) by mouth daily. 30 tablet 0  . Magnesium 250 MG TABS Take 250 mg by mouth daily.    . Omega-3 Fatty Acids (FISH OIL) 1000 MG CAPS Take by mouth 4 (four) times daily.    . Red Yeast Rice Extract (RED YEAST RICE PO) Take 1,200 mg by mouth 2 (two) times daily.     Marland Kitchen testosterone cypionate (DEPOTESTOTERONE CYPIONATE) 200 MG/ML injection Inject 2 cc IM every 2 weeks or as directed by your doctor 10 mL 1  . valACYclovir (VALTREX) 1000 MG tablet Take 1 tablet (1,000 mg total) by mouth daily. 30 tablet 1  . VITAMIN D, ERGOCALCIFEROL, PO Take 5,000 Int'l Units/day by mouth.     No current facility-administered medications on file prior to visit.    ALLERGIES: Allergies  Allergen Reactions  . Toprol Xl [Metoprolol Tartrate]     ED    FAMILY HISTORY: Family History  Problem Relation Age of Onset  . Colon cancer Maternal Uncle   . Colon cancer Paternal Uncle   . Colon cancer Maternal Grandfather   . Colon cancer Paternal Grandfather     SOCIAL HISTORY: Social History   Social History  . Marital Status: Married  Spouse Name: N/A  . Number of Children: N/A  . Years of Education: N/A   Occupational History  . Not on file.   Social History Main Topics  . Smoking status: Former Smoker    Quit date: 09/20/1989  . Smokeless tobacco: Never Used  . Alcohol Use: No  . Drug Use: No  . Sexual Activity:    Partners: Female   Other Topics Concern  . Not on file   Social History Narrative    REVIEW OF SYSTEMS: Constitutional: No fevers, chills, or sweats, no generalized fatigue, change in appetite Eyes: No visual changes, double vision, eye pain Ear, nose and throat: No hearing loss, ear pain, nasal congestion, sore throat Cardiovascular: No chest pain, palpitations Respiratory:  No  shortness of breath at rest or with exertion, wheezes GastrointestinaI: No nausea, vomiting, diarrhea, abdominal pain, fecal incontinence Genitourinary:  No dysuria, urinary retention or frequency Musculoskeletal:  No neck pain, back pain Integumentary: No rash, pruritus, skin lesions Neurological: as above Psychiatric: No depression, insomnia, anxiety Endocrine: No palpitations, fatigue, diaphoresis, mood swings, change in appetite, change in weight, increased thirst Hematologic/Lymphatic:  No anemia, purpura, petechiae. Allergic/Immunologic: no itchy/runny eyes, nasal congestion, recent allergic reactions, rashes  PHYSICAL EXAM: Filed Vitals:   05/20/15 0809  BP: 140/72  Pulse: 70   General: No acute distress.  Patient appears well-groomed.   Head:  Normocephalic/atraumatic Eyes:  Fundoscopic exam unremarkable without vessel changes, exudates, hemorrhages or papilledema. Neck: supple, no paraspinal tenderness, full range of motion Heart:  Regular rate and rhythm Lungs:  Clear to auscultation bilaterally Back: No paraspinal tenderness Neurological Exam: alert and oriented to person, place, and time. Attention span and concentration intact, recent and remote memory intact, fund of knowledge intact.  Speech fluent and not dysarthric, language intact.  CN II-XII intact. Fundoscopic exam unremarkable without vessel changes, exudates, hemorrhages or papilledema.  Bulk and tone normal, muscle strength 5/5 throughout.  Sensation to light touch, temperature and vibration intact.  Deep tendon reflexes 2+ throughout, toes downgoing.  Finger to nose and heel to shin testing intact.  Gait normal, Romberg negative.  IMPRESSION: Right sided trigeminal neuralgia  PLAN: Increase oxcarbazepine to 600mg  twice daily Recheck Na level in November when follow up with PCP Blood pressure mildly elevated, so have rechecked with PCP Follow up in 3 months.  15 minutes spent face to face with patient, over  50% spent discussing management.  Metta Clines, DO  CC:  [name], MD

## 2015-05-20 NOTE — Patient Instructions (Signed)
Increase oxcarbazepine to 600mg  twice daily (new prescription sent to pharmacy) Have sodium level checked in November when you see Dr. Melford Aase again Follow up in 3 months

## 2015-07-23 ENCOUNTER — Encounter: Payer: Self-pay | Admitting: Internal Medicine

## 2015-07-23 ENCOUNTER — Ambulatory Visit (INDEPENDENT_AMBULATORY_CARE_PROVIDER_SITE_OTHER): Payer: BC Managed Care – PPO | Admitting: Internal Medicine

## 2015-07-23 VITALS — BP 132/76 | HR 72 | Temp 97.5°F | Resp 16 | Ht 72.25 in | Wt 262.8 lb

## 2015-07-23 DIAGNOSIS — E291 Testicular hypofunction: Secondary | ICD-10-CM | POA: Diagnosis not present

## 2015-07-23 DIAGNOSIS — I10 Essential (primary) hypertension: Secondary | ICD-10-CM | POA: Diagnosis not present

## 2015-07-23 DIAGNOSIS — Z9989 Dependence on other enabling machines and devices: Secondary | ICD-10-CM

## 2015-07-23 DIAGNOSIS — E559 Vitamin D deficiency, unspecified: Secondary | ICD-10-CM | POA: Diagnosis not present

## 2015-07-23 DIAGNOSIS — G4733 Obstructive sleep apnea (adult) (pediatric): Secondary | ICD-10-CM

## 2015-07-23 DIAGNOSIS — Z6835 Body mass index (BMI) 35.0-35.9, adult: Secondary | ICD-10-CM | POA: Diagnosis not present

## 2015-07-23 DIAGNOSIS — R7309 Other abnormal glucose: Secondary | ICD-10-CM

## 2015-07-23 DIAGNOSIS — E785 Hyperlipidemia, unspecified: Secondary | ICD-10-CM | POA: Diagnosis not present

## 2015-07-23 DIAGNOSIS — Z79899 Other long term (current) drug therapy: Secondary | ICD-10-CM | POA: Diagnosis not present

## 2015-07-23 LAB — HEPATIC FUNCTION PANEL
ALBUMIN: 4.6 g/dL (ref 3.6–5.1)
ALK PHOS: 45 U/L (ref 40–115)
ALT: 33 U/L (ref 9–46)
AST: 38 U/L — AB (ref 10–35)
BILIRUBIN TOTAL: 0.4 mg/dL (ref 0.2–1.2)
Bilirubin, Direct: 0.1 mg/dL (ref <=0.2)
Indirect Bilirubin: 0.3 mg/dL (ref 0.2–1.2)
Total Protein: 7.5 g/dL (ref 6.1–8.1)

## 2015-07-23 LAB — BASIC METABOLIC PANEL WITH GFR
BUN: 10 mg/dL (ref 7–25)
CHLORIDE: 98 mmol/L (ref 98–110)
CO2: 26 mmol/L (ref 20–31)
CREATININE: 1.15 mg/dL (ref 0.70–1.33)
Calcium: 9.6 mg/dL (ref 8.6–10.3)
GFR, Est African American: 85 mL/min (ref >=60)
GFR, Est Non African American: 73 mL/min (ref >=60)
GLUCOSE: 94 mg/dL (ref 65–99)
Potassium: 4.1 mmol/L (ref 3.5–5.3)
Sodium: 133 mmol/L — ABNORMAL LOW (ref 135–146)

## 2015-07-23 LAB — CBC WITH DIFFERENTIAL/PLATELET
BASOS ABS: 0 10*3/uL (ref 0.0–0.1)
Basophils Relative: 0 % (ref 0–1)
Eosinophils Absolute: 0.2 10*3/uL (ref 0.0–0.7)
Eosinophils Relative: 3 % (ref 0–5)
HEMATOCRIT: 45.8 % (ref 39.0–52.0)
HEMOGLOBIN: 15.4 g/dL (ref 13.0–17.0)
Lymphocytes Relative: 30 % (ref 12–46)
Lymphs Abs: 1.8 10*3/uL (ref 0.7–4.0)
MCH: 29.1 pg (ref 26.0–34.0)
MCHC: 33.6 g/dL (ref 30.0–36.0)
MCV: 86.4 fL (ref 78.0–100.0)
MPV: 8.8 fL (ref 8.6–12.4)
Monocytes Absolute: 0.8 10*3/uL (ref 0.1–1.0)
Monocytes Relative: 14 % — ABNORMAL HIGH (ref 3–12)
NEUTROS ABS: 3.2 10*3/uL (ref 1.7–7.7)
Neutrophils Relative %: 53 % (ref 43–77)
Platelets: 291 10*3/uL (ref 150–400)
RBC: 5.3 MIL/uL (ref 4.22–5.81)
RDW: 15.2 % (ref 11.5–15.5)
WBC: 6 10*3/uL (ref 4.0–10.5)

## 2015-07-23 LAB — LIPID PANEL
Cholesterol: 153 mg/dL (ref 125–200)
HDL: 33 mg/dL — AB (ref >=40)
LDL CALC: 91 mg/dL (ref <130)
Total CHOL/HDL Ratio: 4.6 Ratio (ref <=5.0)
Triglycerides: 143 mg/dL (ref <150)
VLDL: 29 mg/dL (ref <30)

## 2015-07-23 LAB — MAGNESIUM: Magnesium: 2.1 mg/dL (ref 1.5–2.5)

## 2015-07-23 NOTE — Patient Instructions (Signed)

## 2015-07-24 LAB — TSH: TSH: 2.744 u[IU]/mL (ref 0.350–4.500)

## 2015-07-24 LAB — INSULIN, RANDOM: INSULIN: 16.2 u[IU]/mL (ref 2.0–19.6)

## 2015-07-24 LAB — VITAMIN D 25 HYDROXY (VIT D DEFICIENCY, FRACTURES): Vit D, 25-Hydroxy: 75 ng/mL (ref 30–100)

## 2015-07-24 LAB — HEMOGLOBIN A1C
Hgb A1c MFr Bld: 6 % — ABNORMAL HIGH (ref <5.7)
Mean Plasma Glucose: 126 mg/dL — ABNORMAL HIGH (ref <117)

## 2015-07-26 ENCOUNTER — Encounter: Payer: Self-pay | Admitting: Internal Medicine

## 2015-07-26 NOTE — Progress Notes (Signed)
Patient ID: Donald Schwartz, male   DOB: 01/14/1966, 49 y.o.   MRN: [id number]   This very nice 49 y.o. MWM presents for 3 month follow up with Hypertension, Hyperlipidemia, Pre-Diabetes, Testosterone and Vitamin D Deficiency. Patient is followed by Dr Tomi Likens for Trigeminal Neuralgia. Patient is also on CPAP for OSA.    Patient is treated for HTN since 2002 & BP has been controlled at home. Today's BP: 132/76 mmHg. Patient has had no complaints of any cardiac type chest pain, palpitations, dyspnea/orthopnea/PND, dizziness, claudication, or dependent edema.   Hyperlipidemia is controlled with diet & meds. Patient denies myalgias or other med SE's. Today's  Lipids are at goal with Cholesterol 153; HDL 33*; LDL 91; Triglycerides 143.   Also, the patient has history of PreDiabetes since 2008 with A1c 6.3% and has had no symptoms of reactive hypoglycemia, diabetic polys, paresthesias or visual blurring.  Today's A1c is not at goal with A1c 6.0%.     Patient is on Replacement for Low T with improved sense of well-being. Further, the patient also has history of Vitamin D Deficiency of "31" in 2008 and supplements vitamin D without any suspected side-effects. Today's vitamin D is at goal at 56.     Medication Sig  . aspirin 81 MG  Chew by mouth daily.  Marland Kitchen diltiazem  120 MG tablet TAKE ONE TABLET BY MOUTH ONCE DAILY  . FLONASE  nasal spray USE ONE SPRAY EACH NOSTRIL TWICE DAILY  . MIDRIN 65-325-100 MG Take 1 capsule by mouth 4 (four) times daily as needed for migraine.   . Magnesium 250 MG TABS Take 250 mg by mouth daily.  Marland Kitchen FISH OIL 1000 MG CAPS Take by mouth 4 (four) times daily.  Marland Kitchen oxcarbazepine (TRILEPTAL) 600 MG tablet Take 1 tablet (600 mg total) by mouth 2 (two) times daily.  . Red Yeast Rice Extract Take 1,200 mg by mouth 2 (two) times daily.   Marland Kitchen  DEPO-TESTOTERONE 200 MG/ML inj Inject 2 cc IM every 2 weeks or as directed by your doctor  . valACYclovir  1000 MG tablet Take 1 tablet (1,000 mg total)  by mouth daily.  Marland Kitchen VITAMIN D Take 5,000 Int'l Units/day by mouth.  . losartan 25 MG tablet Take 1 tablet (25 mg total) by mouth daily.   Allergies  Allergen Reactions  . Toprol Xl [Metoprolol Tartrate]     ED   PMHx:   Past Medical History  Diagnosis Date  . Hypertension   . Vitamin D deficiency   . Hypogonadism male   . Elevated LDL cholesterol level   . Elevated hemoglobin A1c   . Hx of colonic polyp 11/20/2014   Immunization History  Administered Date(s) Administered  . Influenza Split 07/10/2014  . PPD Test 01/07/2014  . Pneumococcal-Unspecified 09/20/2009  . Td 09/20/2005   Past Surgical History  Procedure Laterality Date  . Hip fracture surgery Right 1986    avulsion/chip fracture ?stress fracture  . Patella fracture surgery Right 1997  . C5-6 fusion     FHx:    Reviewed / unchanged  SHx:    Reviewed / unchanged  Systems Review:  Constitutional: Denies fever, chills, wt changes, headaches, insomnia, fatigue, night sweats, change in appetite. Eyes: Denies redness, blurred vision, diplopia, discharge, itchy, watery eyes.  ENT: Denies discharge, congestion, post nasal drip, epistaxis, sore throat, earache, hearing loss, dental pain, tinnitus, vertigo, sinus pain, snoring.  CV: Denies chest pain, palpitations, irregular heartbeat, syncope, dyspnea, diaphoresis, orthopnea, PND, claudication or  edema. Respiratory: denies cough, dyspnea, DOE, pleurisy, hoarseness, laryngitis, wheezing.  Gastrointestinal: Denies dysphagia, odynophagia, heartburn, reflux, water brash, abdominal pain or cramps, nausea, vomiting, bloating, diarrhea, constipation, hematemesis, melena, hematochezia  or hemorrhoids. Genitourinary: Denies dysuria, frequency, urgency, nocturia, hesitancy, discharge, hematuria or flank pain. Musculoskeletal: Denies arthralgias, myalgias, stiffness, jt. swelling, pain, limping or strain/sprain.  Skin: Denies pruritus, rash, hives, warts, acne, eczema or change in skin  lesion(s). Neuro: No weakness, tremor, incoordination, spasms, paresthesia or pain. Psychiatric: Denies confusion, memory loss or sensory loss. Endo: Denies change in weight, skin or hair change.  Heme/Lymph: No excessive bleeding, bruising or enlarged lymph nodes.  Physical Exam  BP 132/76 mmHg  Pulse 72  Temp(Src) 97.5 F (36.4 C)  Resp 16  Ht 6' 0.25" (1.835 m)  Wt 262 lb 12.8 oz (119.205 kg)  BMI 35.40 kg/m2  Appears well nourished and in no distress. Eyes: PERRLA, EOMs, conjunctiva no swelling or erythema. Sinuses: No frontal/maxillary tenderness ENT/Mouth: EAC's clear, TM's nl w/o erythema, bulging. Nares clear w/o erythema, swelling, exudates. Oropharynx clear without erythema or exudates. Oral hygiene is good. Tongue normal, non obstructing. Hearing intact.  Neck: Supple. Thyroid nl. Car 2+/2+ without bruits, nodes or JVD. Chest: Respirations nl with BS clear & equal w/o rales, rhonchi, wheezing or stridor.  Cor: Heart sounds normal w/ regular rate and rhythm without sig. murmurs, gallops, clicks, or rubs. Peripheral pulses normal and equal  without edema.  Abdomen: Soft & bowel sounds normal. Non-tender w/o guarding, rebound, hernias, masses, or organomegaly.  Lymphatics: Unremarkable.  Musculoskeletal: Full ROM all peripheral extremities, joint stability, 5/5 strength, and normal gait.  Skin: Warm, dry without exposed rashes, lesions or ecchymosis apparent.  Neuro: Cranial nerves intact, reflexes equal bilaterally. Sensory-motor testing grossly intact. Tendon reflexes grossly intact.  Pysch: Alert & oriented x 3.  Insight and judgement nl & appropriate. No ideations.  Assessment and Plan:  1. Essential hypertension  - TSH  2. Hyperlipidemia  - Lipid panel  3. PreDiabetes  - Hemoglobin A1c - Insulin, random  4. Vitamin D deficiency  - Vit D  25 hydroxy   5. Testosterone Deficiency   6. OSA on CPAP   7. Medication management  - CBC with  Differential/Platelet - BASIC METABOLIC PANEL WITH GFR - Hepatic function panel - Magnesium  8. BMI 35.0-35.9,adult   Recommended regular exercise, BP monitoring, weight control, and discussed med and SE's. Recommended labs to assess and monitor clinical status. Further disposition pending results of labs. Over 30 minutes of exam, counseling, chart review was performed

## 2015-08-21 ENCOUNTER — Encounter: Payer: Self-pay | Admitting: Neurology

## 2015-08-21 ENCOUNTER — Ambulatory Visit (INDEPENDENT_AMBULATORY_CARE_PROVIDER_SITE_OTHER): Payer: BC Managed Care – PPO | Admitting: Neurology

## 2015-08-21 VITALS — BP 130/74 | HR 73 | Ht 72.0 in | Wt 265.2 lb

## 2015-08-21 DIAGNOSIS — G500 Trigeminal neuralgia: Secondary | ICD-10-CM

## 2015-08-21 NOTE — Progress Notes (Signed)
NEUROLOGY FOLLOW UP OFFICE NOTE  Donald Schwartz [id number]  HISTORY OF PRESENT ILLNESS: Donald Schwartz is a 49 year old right-handed man with hypertension, hyperlipidemia and OSA who follows up for right sided trigeminal neuralgia.  UPDATE: He is taking oxcarbazepine 600mg  twice daily.  Na from last month was 133.  He is doing well.  He is pain-free.    HISTORY: Beginning in January, he started having pain involving the right side of the face.  It started in the right eye, then involving the right top of head, right side of nose and right upper teeth.  It is a shooting pain.  It is triggered by lightly touching his cheek, eating or the feeling of water running over his head.  Sometimes there is a slight tingling but usually not.  There is no numbness or facial weakness.  He was evaluated by the eye doctor and dentist, with normal exams.    Prior medications:  Lyrica.  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  PAST MEDICAL HISTORY: Past Medical History  Diagnosis Date  . Hypertension   . Vitamin D deficiency   . Hypogonadism male   . Elevated LDL cholesterol level   . Elevated hemoglobin A1c   . Hx of colonic polyp 11/20/2014    MEDICATIONS: Current Outpatient Prescriptions on File Prior to Visit  Medication Sig Dispense Refill  . aspirin 81 MG chewable tablet Chew by mouth daily.    Marland Kitchen diltiazem (CARDIZEM) 120 MG tablet TAKE ONE TABLET BY MOUTH ONCE DAILY 90 tablet 1  . fluticasone (FLONASE) 50 MCG/ACT nasal spray USE ONE SPRAY EACH NOSTRIL TWICE DAILY 16 g 3  . isometheptene-acetaminophen-dichloralphenazone (MIDRIN) 65-325-100 MG capsule Take 1 capsule by mouth 4 (four) times daily as needed for migraine. Maximum 5 capsules in 12 hours for migraine headaches, 8 capsules in 24 hours for tension headaches.    . Magnesium 250 MG TABS Take 250 mg by mouth daily.    . Omega-3 Fatty Acids (FISH OIL) 1000 MG CAPS Take by mouth 4 (four) times  daily.    Marland Kitchen oxcarbazepine (TRILEPTAL) 600 MG tablet Take 1 tablet (600 mg total) by mouth 2 (two) times daily. 60 tablet 6  . Red Yeast Rice Extract (RED YEAST RICE PO) Take 1,200 mg by mouth 2 (two) times daily.     Marland Kitchen testosterone cypionate (DEPOTESTOTERONE CYPIONATE) 200 MG/ML injection Inject 2 cc IM every 2 weeks or as directed by your doctor 10 mL 1  . valACYclovir (VALTREX) 1000 MG tablet Take 1 tablet (1,000 mg total) by mouth daily. 30 tablet 1  . VITAMIN D, ERGOCALCIFEROL, PO Take 5,000 Int'l Units/day by mouth.     No current facility-administered medications on file prior to visit.    ALLERGIES: Allergies  Allergen Reactions  . Toprol Xl [Metoprolol Tartrate]     ED    FAMILY HISTORY: Family History  Problem Relation Age of Onset  . Colon cancer Maternal Uncle   . Colon cancer Paternal Uncle   . Colon cancer Maternal Grandfather   . Colon cancer Paternal Grandfather     SOCIAL HISTORY: Social History   Social History  . Marital Status: Married    Spouse Name: N/A  . Number of Children: N/A  . Years of Education: N/A   Occupational History  . Not on file.   Social History Main Topics  . Smoking status: Former Smoker    Quit date: 09/20/1989  . Smokeless tobacco: Never  Used  . Alcohol Use: No  . Drug Use: No  . Sexual Activity:    Partners: Female   Other Topics Concern  . Not on file   Social History Narrative    REVIEW OF SYSTEMS: Constitutional: No fevers, chills, or sweats, no generalized fatigue, change in appetite Eyes: No visual changes, double vision, eye pain Ear, nose and throat: No hearing loss, ear pain, nasal congestion, sore throat Cardiovascular: No chest pain, palpitations Respiratory:  No shortness of breath at rest or with exertion, wheezes GastrointestinaI: No nausea, vomiting, diarrhea, abdominal pain, fecal incontinence Genitourinary:  No dysuria, urinary retention or frequency Musculoskeletal:  No neck pain, back  pain Integumentary: No rash, pruritus, skin lesions Neurological: as above Psychiatric: No depression, insomnia, anxiety Endocrine: No palpitations, fatigue, diaphoresis, mood swings, change in appetite, change in weight, increased thirst Hematologic/Lymphatic:  No anemia, purpura, petechiae. Allergic/Immunologic: no itchy/runny eyes, nasal congestion, recent allergic reactions, rashes  PHYSICAL EXAM: Filed Vitals:   08/21/15 0732  BP: 130/74  Pulse: 73   General: No acute distress.  Patient appears well-groomed.   Head:  Normocephalic/atraumatic Eyes:  Fundoscopic exam unremarkable without vessel changes, exudates, hemorrhages or papilledema. Neck: supple, no paraspinal tenderness, full range of motion Heart:  Regular rate and rhythm Lungs:  Clear to auscultation bilaterally Back: No paraspinal tenderness Neurological Exam: alert and oriented to person, place, and time. Attention span and concentration intact, recent and remote memory intact, fund of knowledge intact.  Speech fluent and not dysarthric, language intact.  CN II-XII intact. Fundoscopic exam unremarkable without vessel changes, exudates, hemorrhages or papilledema.  Bulk and tone normal, muscle strength 5/5 throughout.  Sensation to light touch, temperature and vibration intact.  Deep tendon reflexes 2+ throughout.  Finger to nose and heel to shin testing intact.  Gait normal.  IMPRESSION: Right-sided trigeminal neuralgia, stable  PLAN: Oxcarbazepine 600mg  twice daily Recheck BMP in 6 months prior to follow up.  15 minutes spent face to face with patient, over 50% spent discussing management and prognosis.  Metta Clines, DO  CC:  [name], MD

## 2015-08-21 NOTE — Patient Instructions (Signed)
Continue oxcarbazepine 600mg  twice daily Follow up in 6 months.  Have Dr. Melford Aase check basic metabolic panel prior to follow up.

## 2015-09-22 ENCOUNTER — Other Ambulatory Visit: Payer: Self-pay | Admitting: Internal Medicine

## 2015-09-22 DIAGNOSIS — E349 Endocrine disorder, unspecified: Secondary | ICD-10-CM

## 2015-09-23 ENCOUNTER — Other Ambulatory Visit: Payer: Self-pay | Admitting: Internal Medicine

## 2015-10-02 ENCOUNTER — Other Ambulatory Visit: Payer: Self-pay | Admitting: Internal Medicine

## 2015-12-11 ENCOUNTER — Other Ambulatory Visit: Payer: Self-pay | Admitting: Neurology

## 2015-12-11 NOTE — Telephone Encounter (Signed)
Last OV: 08/21/15 Next OV: 02/02/16

## 2016-01-25 ENCOUNTER — Other Ambulatory Visit: Payer: Self-pay | Admitting: Physician Assistant

## 2016-02-03 ENCOUNTER — Encounter: Payer: Self-pay | Admitting: Physician Assistant

## 2016-02-03 ENCOUNTER — Ambulatory Visit (INDEPENDENT_AMBULATORY_CARE_PROVIDER_SITE_OTHER): Payer: BC Managed Care – PPO | Admitting: Physician Assistant

## 2016-02-03 DIAGNOSIS — M7551 Bursitis of right shoulder: Secondary | ICD-10-CM

## 2016-02-03 DIAGNOSIS — Z23 Encounter for immunization: Secondary | ICD-10-CM | POA: Diagnosis not present

## 2016-02-03 DIAGNOSIS — Z79899 Other long term (current) drug therapy: Secondary | ICD-10-CM | POA: Diagnosis not present

## 2016-02-03 DIAGNOSIS — E291 Testicular hypofunction: Secondary | ICD-10-CM

## 2016-02-03 DIAGNOSIS — R6889 Other general symptoms and signs: Secondary | ICD-10-CM | POA: Diagnosis not present

## 2016-02-03 DIAGNOSIS — R7309 Other abnormal glucose: Secondary | ICD-10-CM

## 2016-02-03 DIAGNOSIS — Z136 Encounter for screening for cardiovascular disorders: Secondary | ICD-10-CM

## 2016-02-03 DIAGNOSIS — E669 Obesity, unspecified: Secondary | ICD-10-CM | POA: Insufficient documentation

## 2016-02-03 DIAGNOSIS — G4733 Obstructive sleep apnea (adult) (pediatric): Secondary | ICD-10-CM | POA: Diagnosis not present

## 2016-02-03 DIAGNOSIS — G500 Trigeminal neuralgia: Secondary | ICD-10-CM

## 2016-02-03 DIAGNOSIS — Z125 Encounter for screening for malignant neoplasm of prostate: Secondary | ICD-10-CM

## 2016-02-03 DIAGNOSIS — N182 Chronic kidney disease, stage 2 (mild): Secondary | ICD-10-CM | POA: Diagnosis not present

## 2016-02-03 DIAGNOSIS — E559 Vitamin D deficiency, unspecified: Secondary | ICD-10-CM

## 2016-02-03 DIAGNOSIS — K219 Gastro-esophageal reflux disease without esophagitis: Secondary | ICD-10-CM

## 2016-02-03 DIAGNOSIS — Z0001 Encounter for general adult medical examination with abnormal findings: Secondary | ICD-10-CM | POA: Diagnosis not present

## 2016-02-03 DIAGNOSIS — Z9989 Dependence on other enabling machines and devices: Secondary | ICD-10-CM

## 2016-02-03 DIAGNOSIS — E785 Hyperlipidemia, unspecified: Secondary | ICD-10-CM

## 2016-02-03 DIAGNOSIS — I10 Essential (primary) hypertension: Secondary | ICD-10-CM

## 2016-02-03 DIAGNOSIS — Z86010 Personal history of colonic polyps: Secondary | ICD-10-CM

## 2016-02-03 LAB — BASIC METABOLIC PANEL WITH GFR
BUN: 11 mg/dL (ref 7–25)
CALCIUM: 9.8 mg/dL (ref 8.6–10.3)
CO2: 23 mmol/L (ref 20–31)
CREATININE: 1.25 mg/dL (ref 0.70–1.33)
Chloride: 101 mmol/L (ref 98–110)
GFR, EST AFRICAN AMERICAN: 77 mL/min (ref >=60)
GFR, EST NON AFRICAN AMERICAN: 66 mL/min (ref >=60)
GLUCOSE: 94 mg/dL (ref 65–99)
Potassium: 4.3 mmol/L (ref 3.5–5.3)
Sodium: 135 mmol/L (ref 135–146)

## 2016-02-03 LAB — LIPID PANEL
CHOLESTEROL: 161 mg/dL (ref 125–200)
HDL: 39 mg/dL — ABNORMAL LOW (ref >=40)
LDL Cholesterol: 101 mg/dL (ref <130)
TRIGLYCERIDES: 105 mg/dL (ref <150)
Total CHOL/HDL Ratio: 4.1 Ratio (ref <=5.0)
VLDL: 21 mg/dL (ref <30)

## 2016-02-03 LAB — HEPATIC FUNCTION PANEL
ALBUMIN: 4.6 g/dL (ref 3.6–5.1)
ALT: 30 U/L (ref 9–46)
AST: 33 U/L (ref 10–35)
Alkaline Phosphatase: 50 U/L (ref 40–115)
Bilirubin, Direct: 0.1 mg/dL (ref <=0.2)
Indirect Bilirubin: 0.4 mg/dL (ref 0.2–1.2)
TOTAL PROTEIN: 7.6 g/dL (ref 6.1–8.1)
Total Bilirubin: 0.5 mg/dL (ref 0.2–1.2)

## 2016-02-03 LAB — CBC WITH DIFFERENTIAL/PLATELET
BASOS ABS: 55 {cells}/uL (ref 0–200)
Basophils Relative: 1 %
Eosinophils Absolute: 220 cells/uL (ref 15–500)
Eosinophils Relative: 4 %
HEMATOCRIT: 49.5 % (ref 38.5–50.0)
Hemoglobin: 16.6 g/dL (ref 13.2–17.1)
LYMPHS PCT: 29 %
Lymphs Abs: 1595 cells/uL (ref 850–3900)
MCH: 29.2 pg (ref 27.0–33.0)
MCHC: 33.5 g/dL (ref 32.0–36.0)
MCV: 87.1 fL (ref 80.0–100.0)
MONO ABS: 880 {cells}/uL (ref 200–950)
MPV: 8.6 fL (ref 7.5–12.5)
Monocytes Relative: 16 %
NEUTROS PCT: 50 %
Neutro Abs: 2750 cells/uL (ref 1500–7800)
Platelets: 280 10*3/uL (ref 140–400)
RBC: 5.68 MIL/uL (ref 4.20–5.80)
RDW: 14.2 % (ref 11.0–15.0)
WBC: 5.5 10*3/uL (ref 3.8–10.8)

## 2016-02-03 LAB — HEMOGLOBIN A1C
Hgb A1c MFr Bld: 5.8 % — ABNORMAL HIGH (ref <5.7)
MEAN PLASMA GLUCOSE: 120 mg/dL

## 2016-02-03 LAB — MAGNESIUM: MAGNESIUM: 2.2 mg/dL (ref 1.5–2.5)

## 2016-02-03 LAB — URIC ACID: Uric Acid, Serum: 5.9 mg/dL (ref 4.0–7.8)

## 2016-02-03 MED ORDER — DEXAMETHASONE SODIUM PHOSPHATE 100 MG/10ML IJ SOLN
10.0000 mg | Freq: Once | INTRAMUSCULAR | Status: AC
  Administered 2016-02-03: 10 mg via INTRAMUSCULAR

## 2016-02-03 MED ORDER — LISINOPRIL 40 MG PO TABS: 40.0000 mg | ORAL_TABLET | Freq: Every day | ORAL | Status: DC

## 2016-02-03 NOTE — Patient Instructions (Addendum)
Take omeprazole over the counter for 2 weeks, then go to zantac 150-300 mg at night for 2 weeks, then you can stop.  Avoid alcohol, spicy foods, NSAIDS (aleve, ibuprofen) at this time. See foods below.   Food Choices for Gastroesophageal Reflux Disease When you have gastroesophageal reflux disease (GERD), the foods you eat and your eating habits are very important. Choosing the right foods can help ease the discomfort of GERD. WHAT GENERAL GUIDELINES DO I NEED TO FOLLOW?  Choose fruits, vegetables, whole grains, low-fat dairy products, and low-fat meat, fish, and poultry.  Limit fats such as oils, salad dressings, butter, nuts, and avocado.  Keep a food diary to identify foods that cause symptoms.  Avoid foods that cause reflux. These may be different for different people.  Eat frequent small meals instead of three large meals each day.  Eat your meals slowly, in a relaxed setting.  Limit fried foods.  Cook foods using methods other than frying.  Avoid drinking alcohol.  Avoid drinking large amounts of liquids with your meals.  Avoid bending over or lying down until 2-3 hours after eating. WHAT FOODS ARE NOT RECOMMENDED? The following are some foods and drinks that may worsen your symptoms: Vegetables Tomatoes. Tomato juice. Tomato and spaghetti sauce. Chili peppers. Onion and garlic. Horseradish. Fruits Oranges, grapefruit, and lemon (fruit and juice). Meats High-fat meats, fish, and poultry. This includes hot dogs, ribs, ham, sausage, salami, and bacon. Dairy Whole milk and chocolate milk. Sour cream. Cream. Butter. Ice cream. Cream cheese.  Beverages Coffee and tea, with or without caffeine. Carbonated beverages or energy drinks. Condiments Hot sauce. Barbecue sauce.  Sweets/Desserts Chocolate and cocoa. Donuts. Peppermint and spearmint. Fats and Oils High-fat foods, including [place] fries and potato chips. Other Vinegar. Strong spices, such as black pepper, white  pepper, red pepper, cayenne, curry powder, cloves, ginger, and chili powder.  Impingement Syndrome, Rotator Cuff, Bursitis With Rehab Impingement syndrome is a condition that involves inflammation of the tendons of the rotator cuff and the subacromial bursa, that causes pain in the shoulder. The rotator cuff consists of four tendons and muscles that control much of the shoulder and upper arm function. The subacromial bursa is a fluid filled sac that helps reduce friction between the rotator cuff and one of the bones of the shoulder (acromion). Impingement syndrome is usually an overuse injury that causes swelling of the bursa (bursitis), swelling of the tendon (tendonitis), and/or a tear of the tendon (strain). Strains are classified into three categories. Grade 1 strains cause pain, but the tendon is not lengthened. Grade 2 strains include a lengthened ligament, due to the ligament being stretched or partially ruptured. With grade 2 strains there is still function, although the function may be decreased. Grade 3 strains include a complete tear of the tendon or muscle, and function is usually impaired. SYMPTOMS   Pain around the shoulder, often at the outer portion of the upper arm.  Pain that gets worse with shoulder function, especially when reaching overhead or lifting.  Sometimes, aching when not using the arm.  Pain that wakes you up at night.  Sometimes, tenderness, swelling, warmth, or redness over the affected area.  Loss of strength.  Limited motion of the shoulder, especially reaching behind the back (to the back pocket or to unhook bra) or across your body.  Crackling sound (crepitation) when moving the arm.  Biceps tendon pain and inflammation (in the front of the shoulder). Worse when bending the elbow or lifting. CAUSES  Impingement syndrome is often an overuse injury, in which chronic (repetitive) motions cause the tendons or bursa to become inflamed. A strain occurs when a  force is paced on the tendon or muscle that is greater than it can withstand. Common mechanisms of injury include: Stress from sudden increase in duration, frequency, or intensity of training.  Direct hit (trauma) to the shoulder.  Aging, erosion of the tendon with normal use.  Bony bump on shoulder (acromial spur). RISK INCREASES WITH:  Contact sports (football, wrestling, boxing).  Throwing sports (baseball, tennis, volleyball).  Weightlifting and bodybuilding.  Heavy labor.  Previous injury to the rotator cuff, including impingement.  Poor shoulder strength and flexibility.  Failure to warm up properly before activity.  Inadequate protective equipment.  Old age.  Bony bump on shoulder (acromial spur). PREVENTION   Warm up and stretch properly before activity.  Allow for adequate recovery between workouts.  Maintain physical fitness:  Strength, flexibility, and endurance.  Cardiovascular fitness.  Learn and use proper exercise technique. PROGNOSIS  If treated properly, impingement syndrome usually goes away within 6 weeks. Sometimes surgery is required.  RELATED COMPLICATIONS   Longer healing time if not properly treated, or if not given enough time to heal.  Recurring symptoms, that result in a chronic condition.  Shoulder stiffness, frozen shoulder, or loss of motion.  Rotator cuff tendon tear.  Recurring symptoms, especially if activity is resumed too soon, with overuse, with a direct blow, or when using poor technique. TREATMENT  Treatment first involves the use of ice and medicine, to reduce pain and inflammation. The use of strengthening and stretching exercises may help reduce pain with activity. These exercises may be performed at home or with a therapist. If non-surgical treatment is unsuccessful after more than 6 months, surgery may be advised. After surgery and rehabilitation, activity is usually possible in 3 months.  MEDICATION  If pain  medicine is needed, nonsteroidal anti-inflammatory medicines (aspirin and ibuprofen), or other minor pain relievers (acetaminophen), are often advised.  Do not take pain medicine for 7 days before surgery.  Prescription pain relievers may be given, if your caregiver thinks they are needed. Use only as directed and only as much as you need.  Corticosteroid injections may be given by your caregiver. These injections should be reserved for the most serious cases, because they may only be given a certain number of times. HEAT AND COLD  Cold treatment (icing) should be applied for 10 to 15 minutes every 2 to 3 hours for inflammation and pain, and immediately after activity that aggravates your symptoms. Use ice packs or an ice massage.  Heat treatment may be used before performing stretching and strengthening activities prescribed by your caregiver, physical therapist, or athletic trainer. Use a heat pack or a warm water soak. SEEK MEDICAL CARE IF:   Symptoms get worse or do not improve in 4 to 6 weeks, despite treatment.  New, unexplained symptoms develop. (Drugs used in treatment may produce side effects.) EXERCISES  RANGE OF MOTION (ROM) AND STRETCHING EXERCISES - Impingement Syndrome (Rotator Cuff  Tendinitis, Bursitis) These exercises may help you when beginning to rehabilitate your injury. Your symptoms may go away with or without further involvement from your physician, physical therapist or athletic trainer. While completing these exercises, remember:   Restoring tissue flexibility helps normal motion to return to the joints. This allows healthier, less painful movement and activity.  An effective stretch should be held for at least 30 seconds.  A stretch  should never be painful. You should only feel a gentle lengthening or release in the stretched tissue. STRETCH - Flexion, Standing  Stand with good posture. With an underhand grip on your right / left hand, and an overhand grip on  the opposite hand, grasp a broomstick or cane so that your hands are a little more than shoulder width apart.  Keeping your right / left elbow straight and shoulder muscles relaxed, push the stick with your opposite hand, to raise your right / left arm in front of your body and then overhead. Raise your arm until you feel a stretch in your right / left shoulder, but before you have increased shoulder pain.  Try to avoid shrugging your right / left shoulder as your arm rises, by keeping your shoulder blade tucked down and toward your mid-back spine. Hold for __________ seconds.  Slowly return to the starting position. Repeat __________ times. Complete this exercise __________ times per day. STRETCH - Abduction, Supine  Lie on your back. With an underhand grip on your right / left hand and an overhand grip on the opposite hand, grasp a broomstick or cane so that your hands are a little more than shoulder width apart.  Keeping your right / left elbow straight and your shoulder muscles relaxed, push the stick with your opposite hand, to raise your right / left arm out to the side of your body and then overhead. Raise your arm until you feel a stretch in your right / left shoulder, but before you have increased shoulder pain.  Try to avoid shrugging your right / left shoulder as your arm rises, by keeping your shoulder blade tucked down and toward your mid-back spine. Hold for __________ seconds.  Slowly return to the starting position. Repeat __________ times. Complete this exercise __________ times per day. ROM - Flexion, Active-Assisted  Lie on your back. You may bend your knees for comfort.  Grasp a broomstick or cane so your hands are about shoulder width apart. Your right / left hand should grip the end of the stick, so that your hand is positioned "thumbs-up," as if you were about to shake hands.  Using your healthy arm to lead, raise your right / left arm overhead, until you feel a gentle  stretch in your shoulder. Hold for __________ seconds.  Use the stick to assist in returning your right / left arm to its starting position. Repeat __________ times. Complete this exercise __________ times per day.  ROM - Internal Rotation, Supine   Lie on your back on a firm surface. Place your right / left elbow about 60 degrees away from your side. Elevate your elbow with a folded towel, so that the elbow and shoulder are the same height.  Using a broomstick or cane and your strong arm, pull your right / left hand toward your body until you feel a gentle stretch, but no increase in your shoulder pain. Keep your shoulder and elbow in place throughout the exercise.  Hold for __________ seconds. Slowly return to the starting position. Repeat __________ times. Complete this exercise __________ times per day. STRETCH - Internal Rotation  Place your right / left hand behind your back, palm up.  Throw a towel or belt over your opposite shoulder. Grasp the towel with your right / left hand.  While keeping an upright posture, gently pull up on the towel, until you feel a stretch in the front of your right / left shoulder.  Avoid shrugging your right /  left shoulder as your arm rises, by keeping your shoulder blade tucked down and toward your mid-back spine.  Hold for __________ seconds. Release the stretch, by lowering your healthy hand. Repeat __________ times. Complete this exercise __________ times per day. ROM - Internal Rotation   Using an underhand grip, grasp a stick behind your back with both hands.  While standing upright with good posture, slide the stick up your back until you feel a mild stretch in the front of your shoulder.  Hold for __________ seconds. Slowly return to your starting position. Repeat __________ times. Complete this exercise __________ times per day.  STRETCH - Posterior Shoulder Capsule   Stand or sit with good posture. Grasp your right / left elbow and draw  it across your chest, keeping it at the same height as your shoulder.  Pull your elbow, so your upper arm comes in closer to your chest. Pull until you feel a gentle stretch in the back of your shoulder.  Hold for __________ seconds. Repeat __________ times. Complete this exercise __________ times per day. STRENGTHENING EXERCISES - Impingement Syndrome (Rotator Cuff Tendinitis, Bursitis) These exercises may help you when beginning to rehabilitate your injury. They may resolve your symptoms with or without further involvement from your physician, physical therapist or athletic trainer. While completing these exercises, remember:  Muscles can gain both the endurance and the strength needed for everyday activities through controlled exercises.  Complete these exercises as instructed by your physician, physical therapist or athletic trainer. Increase the resistance and repetitions only as guided.  You may experience muscle soreness or fatigue, but the pain or discomfort you are trying to eliminate should never worsen during these exercises. If this pain does get worse, stop and make sure you are following the directions exactly. If the pain is still present after adjustments, discontinue the exercise until you can discuss the trouble with your clinician.  During your recovery, avoid activity or exercises which involve actions that place your injured hand or elbow above your head or behind your back or head. These positions stress the tissues which you are trying to heal. STRENGTH - Scapular Depression and Adduction   With good posture, sit on a firm chair. Support your arms in front of you, with pillows, arm rests, or on a table top. Have your elbows in line with the sides of your body.  Gently draw your shoulder blades down and toward your mid-back spine. Gradually increase the tension, without tensing the muscles along the top of your shoulders and the back of your neck.  Hold for __________  seconds. Slowly release the tension and relax your muscles completely before starting the next repetition.  After you have practiced this exercise, remove the arm support and complete the exercise in standing as well as sitting position. Repeat __________ times. Complete this exercise __________ times per day.  STRENGTH - Shoulder Abductors, Isometric  With good posture, stand or sit about 4-6 inches from a wall, with your right / left side facing the wall.  Bend your right / left elbow. Gently press your right / left elbow into the wall. Increase the pressure gradually, until you are pressing as hard as you can, without shrugging your shoulder or increasing any shoulder discomfort.  Hold for __________ seconds.  Release the tension slowly. Relax your shoulder muscles completely before you begin the next repetition. Repeat __________ times. Complete this exercise __________ times per day.  STRENGTH - External Rotators, Isometric  Keep your right /  left elbow at your side and bend it 90 degrees.  Step into a door frame so that the outside of your right / left wrist can press against the door frame without your upper arm leaving your side.  Gently press your right / left wrist into the door frame, as if you were trying to swing the back of your hand away from your stomach. Gradually increase the tension, until you are pressing as hard as you can, without shrugging your shoulder or increasing any shoulder discomfort.  Hold for __________ seconds.  Release the tension slowly. Relax your shoulder muscles completely before you begin the next repetition. Repeat __________ times. Complete this exercise __________ times per day.  STRENGTH - Supraspinatus   Stand or sit with good posture. Grasp a __________ weight, or an exercise band or tubing, so that your hand is "thumbs-up," like you are shaking hands.  Slowly lift your right / left arm in a "V" away from your thigh, diagonally into the space  between your side and straight ahead. Lift your hand to shoulder height or as far as you can, without increasing any shoulder pain. At first, many people do not lift their hands above shoulder height.  Avoid shrugging your right / left shoulder as your arm rises, by keeping your shoulder blade tucked down and toward your mid-back spine.  Hold for __________ seconds. Control the descent of your hand, as you slowly return to your starting position. Repeat __________ times. Complete this exercise __________ times per day.  STRENGTH - External Rotators  Secure a rubber exercise band or tubing to a fixed object (table, pole) so that it is at the same height as your right / left elbow when you are standing or sitting on a firm surface.  Stand or sit so that the secured exercise band is at your uninjured side.  Bend your right / left elbow 90 degrees. Place a folded towel or small pillow under your right / left arm, so that your elbow is a few inches away from your side.  Keeping the tension on the exercise band, pull it away from your body, as if pivoting on your elbow. Be sure to keep your body steady, so that the movement is coming only from your rotating shoulder.  Hold for __________ seconds. Release the tension in a controlled manner, as you return to the starting position. Repeat __________ times. Complete this exercise __________ times per day.  STRENGTH - Internal Rotators   Secure a rubber exercise band or tubing to a fixed object (table, pole) so that it is at the same height as your right / left elbow when you are standing or sitting on a firm surface.  Stand or sit so that the secured exercise band is at your right / left side.  Bend your elbow 90 degrees. Place a folded towel or small pillow under your right / left arm so that your elbow is a few inches away from your side.  Keeping the tension on the exercise band, pull it across your body, toward your stomach. Be sure to keep your  body steady, so that the movement is coming only from your rotating shoulder.  Hold for __________ seconds. Release the tension in a controlled manner, as you return to the starting position. Repeat __________ times. Complete this exercise __________ times per day.  STRENGTH - Scapular Protractors, Standing   Stand arms length away from a wall. Place your hands on the wall, keeping your elbows  straight.  Begin by dropping your shoulder blades down and toward your mid-back spine.  To strengthen your protractors, keep your shoulder blades down, but slide them forward on your rib cage. It will feel as if you are lifting the back of your rib cage away from the wall. This is a subtle motion and can be challenging to complete. Ask your caregiver for further instruction, if you are not sure you are doing the exercise correctly.  Hold for __________ seconds. Slowly return to the starting position, resting the muscles completely before starting the next repetition. Repeat __________ times. Complete this exercise __________ times per day. STRENGTH - Scapular Protractors, Supine  Lie on your back on a firm surface. Extend your right / left arm straight into the air while holding a __________ weight in your hand.  Keeping your head and back in place, lift your shoulder off the floor.  Hold for __________ seconds. Slowly return to the starting position, and allow your muscles to relax completely before starting the next repetition. Repeat __________ times. Complete this exercise __________ times per day. STRENGTH - Scapular Protractors, Quadruped  Get onto your hands and knees, with your shoulders directly over your hands (or as close as you can be, comfortably).  Keeping your elbows locked, lift the back of your rib cage up into your shoulder blades, so your mid-back rounds out. Keep your neck muscles relaxed.  Hold this position for __________ seconds. Slowly return to the starting position and  allow your muscles to relax completely before starting the next repetition. Repeat __________ times. Complete this exercise __________ times per day.  STRENGTH - Scapular Retractors  Secure a rubber exercise band or tubing to a fixed object (table, pole), so that it is at the height of your shoulders when you are either standing, or sitting on a firm armless chair.  With a palm down grip, grasp an end of the band in each hand. Straighten your elbows and lift your hands straight in front of you, at shoulder height. Step back, away from the secured end of the band, until it becomes tense.  Squeezing your shoulder blades together, draw your elbows back toward your sides, as you bend them. Keep your upper arms lifted away from your body throughout the exercise.  Hold for __________ seconds. Slowly ease the tension on the band, as you reverse the directions and return to the starting position. Repeat __________ times. Complete this exercise __________ times per day. STRENGTH - Shoulder Extensors   Secure a rubber exercise band or tubing to a fixed object (table, pole) so that it is at the height of your shoulders when you are either standing, or sitting on a firm armless chair.  With a thumbs-up grip, grasp an end of the band in each hand. Straighten your elbows and lift your hands straight in front of you, at shoulder height. Step back, away from the secured end of the band, until it becomes tense.  Squeezing your shoulder blades together, pull your hands down to the sides of your thighs. Do not allow your hands to go behind you.  Hold for __________ seconds. Slowly ease the tension on the band, as you reverse the directions and return to the starting position. Repeat __________ times. Complete this exercise __________ times per day.  STRENGTH - Scapular Retractors and External Rotators   Secure a rubber exercise band or tubing to a fixed object (table, pole) so that it is at the height as your  shoulders,  when you are either standing, or sitting on a firm armless chair.  With a palm down grip, grasp an end of the band in each hand. Bend your elbows 90 degrees and lift your elbows to shoulder height, at your sides. Step back, away from the secured end of the band, until it becomes tense.  Squeezing your shoulder blades together, rotate your shoulders so that your upper arms and elbows remain stationary, but your fists travel upward to head height.  Hold for __________ seconds. Slowly ease the tension on the band, as you reverse the directions and return to the starting position. Repeat __________ times. Complete this exercise __________ times per day.  STRENGTH - Scapular Retractors and External Rotators, Rowing   Secure a rubber exercise band or tubing to a fixed object (table, pole) so that it is at the height of your shoulders, when you are either standing, or sitting on a firm armless chair.  With a palm down grip, grasp an end of the band in each hand. Straighten your elbows and lift your hands straight in front of you, at shoulder height. Step back, away from the secured end of the band, until it becomes tense.  Step 1: Squeeze your shoulder blades together. Bending your elbows, draw your hands to your chest, as if you are rowing a boat. At the end of this motion, your hands and elbow should be at shoulder height and your elbows should be out to your sides.  Step 2: Rotate your shoulders, to raise your hands above your head. Your forearms should be vertical and your upper arms should be horizontal.  Hold for __________ seconds. Slowly ease the tension on the band, as you reverse the directions and return to the starting position. Repeat __________ times. Complete this exercise __________ times per day.  STRENGTH - Scapular Depressors  Find a sturdy chair without wheels, such as a dining room chair.  Keeping your feet on the floor, and your hands on the chair arms, lift your  bottom up from the seat, and lock your elbows.  Keeping your elbows straight, allow gravity to pull your body weight down. Your shoulders will rise toward your ears.  Raise your body against gravity by drawing your shoulder blades down your back, shortening the distance between your shoulders and ears. Although your feet should always maintain contact with the floor, your feet should progressively support less body weight, as you get stronger.  Hold for __________ seconds. In a controlled and slow manner, lower your body weight to begin the next repetition. Repeat __________ times. Complete this exercise __________ times per day.    This information is not intended to replace advice given to you by your health care provider. Make sure you discuss any questions you have with your health care provider.   Document Released: 09/06/2005 Document Revised: 09/27/2014 Document Reviewed: 12/19/2008 [employer] Interactive Patient Education [employer].

## 2016-02-03 NOTE — Progress Notes (Signed)
Complete Physical  Assessment and Plan: 1. Essential hypertension - CBC with Differential/Platelet - BASIC METABOLIC PANEL WITH GFR - Hepatic function panel - TSH - Urinalysis, Routine w reflex microscopic (not at [facility]) - Microalbumin / creatinine urine ratio - EKG 12-Lead - [place], RETROPERITNL ABD,  LTD  2. Hyperlipidemia - Lipid panel  3. CKD (chronic kidney disease) stage 2, GFR 60-89 ml/min - BASIC METABOLIC PANEL WITH GFR - Uric acid  4. OSA on CPAP  5. Trigeminal neuralgia of right side of face Continue medications  6. PreDiabetes - Hemoglobin A1c  7. Vitamin D deficiency - VITAMIN D 25 Hydroxy (Vit-D Deficiency, Fractures)  8. Medication management - Magnesium  9. Testosterone Deficiency - Testosterone  10. Hx of colonic polyp + FHx CRCA  11. Morbid obesity, unspecified obesity type ([place]) Obesity with co morbidities- long discussion about weight loss, diet, and exercise  12. Subacromial bursitis, right Right shoulder shoulder- Natural history and expected course discussed. Questions answered. Rest, ice, compression, and elevation (RICE) therapy. Shoulder injection. See procedure note. A trigger point injection was performed at the site of maximal tenderness using 1% plain Lidocaine and decadrone. This was well tolerated, and followed by immediaterelief of pain.  13. Gastroesophageal reflux disease, esophagitis presence not specified Prilosec/zantac, follow up GI if not better, no RUQ pain/no nausea.   14. Encounter for general adult medical examination with abnormal findings - CBC with Differential/Platelet - BASIC METABOLIC PANEL WITH GFR - Hepatic function panel - TSH - Lipid panel - Hemoglobin A1c - Magnesium - VITAMIN D 25 Hydroxy (Vit-D Deficiency, Fractures) - Urinalysis, Routine w reflex microscopic (not at [facility]) - Microalbumin / creatinine urine ratio - PSA - Testosterone - EKG 12-Lead - [place], RETROPERITNL ABD,  LTD - Uric acid  15.  Screening PSA (prostate specific antigen) - PSA   Discussed med's effects and SE's. Screening labs and tests as requested with regular follow-up as recommended. Over 40 minutes of exam, counseling, chart review and critical decision making was performed  HPI Patient presents for a complete physical.   His blood pressure has been controlled at home, today their BP is BP: 128/74 mmHg He does workout, walks with his wife 2-3 days a week, about 2 miles.  He denies chest pain, shortness of breath, dizziness.  States in Nov while at the beach, was holding his dog, boston terrier and she took off in a run and jerked her arm back. He continues to have right shoulder pain. Continues to have left knee pain, has Daldorf in the past. Has been taking midrin occ and has been having heart burn last 1-2 months, has been taking prilosec OTC that has helped, has had right shoulder pain x 2 weeks that improved.  He has history of trigeminal neuralgia and is on medications for this, tolerating well, following with Dr. Tomi Likens.   He is not on cholesterol medication and denies myalgias. His cholesterol is at goal. The cholesterol last visit was:   Lab Results  Component Value Date   CHOL 153 07/23/2015   HDL 33* 07/23/2015   LDLCALC 91 07/23/2015   TRIG 143 07/23/2015   CHOLHDL 4.6 07/23/2015   He has been working on diet and exercise for prediabetes, and denies paresthesia of the feet, polydipsia, polyuria and visual disturbances. Last A1C in the office was:  Lab Results  Component Value Date   HGBA1C 6.0* 07/23/2015  Last GFR: Lab Results  Component Value Date   GFRNONAA 73 07/23/2015  Patient is on  Vitamin D supplement.   Lab Results  Component Value Date   VD25OH 75 07/23/2015    Last PSA was: Lab Results  Component Value Date   PSA 0.73 01/10/2015   BMI is Body mass index is 35.08 kg/(m^2)., he is working on diet and exercise. Wt Readings from Last 3 Encounters:  02/03/16 265 lb 12.8 oz  (120.566 kg)  08/21/15 265 lb 3 oz (120.288 kg)  07/23/15 262 lb 12.8 oz (119.205 kg)    Current Medications:  Current Outpatient Prescriptions on File Prior to Visit  Medication Sig Dispense Refill  . aspirin 81 MG chewable tablet Chew by mouth daily.    Marland Kitchen diltiazem (CARDIZEM) 120 MG tablet TAKE ONE TABLET BY MOUTH ONCE DAILY 90 tablet 1  . fluticasone (FLONASE) 50 MCG/ACT nasal spray USE ONE SPRAY EACH NOSTRIL TWICE DAILY 16 g 3  . isometheptene-acetaminophen-dichloralphenazone (MIDRIN) 65-325-100 MG capsule Take 1 capsule by mouth 4 (four) times daily as needed for migraine. Maximum 5 capsules in 12 hours for migraine headaches, 8 capsules in 24 hours for tension headaches.    Marland Kitchen lisinopril (PRINIVIL,ZESTRIL) 40 MG tablet     . Magnesium 250 MG TABS Take 250 mg by mouth daily.    . Omega-3 Fatty Acids (FISH OIL) 1000 MG CAPS Take by mouth 4 (four) times daily.    Marland Kitchen oxcarbazepine (TRILEPTAL) 600 MG tablet TAKE ONE TABLET BY MOUTH TWICE DAILY 60 tablet 3  . Red Yeast Rice Extract (RED YEAST RICE PO) Take 1,200 mg by mouth 2 (two) times daily.     Marland Kitchen testosterone cypionate (DEPOTESTOSTERONE CYPIONATE) 200 MG/ML injection INJECT 2MLS INTO THE MUSCLE EVERY 2 WEEKS 10 mL 2  . valACYclovir (VALTREX) 1000 MG tablet TAKE ONE TABLET BY MOUTH ONCE DAILY 90 tablet 1  . VITAMIN D, ERGOCALCIFEROL, PO Take 5,000 Int'l Units/day by mouth.     No current facility-administered medications on file prior to visit.   Health Maintenance:  Immunization History  Administered Date(s) Administered  . Influenza Split 07/10/2014  . PPD Test 01/07/2014  . Pneumococcal-Unspecified 09/20/2009  . Td 09/20/2005   Tetanus: 2007 TODAY Pneumovax: 2011 Prevnar 13: due at 44 Flu vaccine: 2016 at work  Zostavax: N/A  Colonoscopy: 10/2014, repeat in 2019 due to family hx/pathology EGD: N/A [place] AB 09/2014 Eye Exam: Dr. Delman Cheadle Dentist: Dr. Zenaida Niece at [facility]  Patient Care Team: [name], MD as PCP -  General (Internal Medicine) [name], MD as PCP - Internal Medicine (Internal Medicine) Gatha Mayer, MD as Consulting Physician (Gastroenterology) Pieter Partridge, DO as Consulting Physician (Neurology) Melrose Nakayama, MD as Consulting Physician (Orthopedic Surgery)  Allergies:  Allergies  Allergen Reactions  . Toprol Xl [Metoprolol Tartrate]     ED   Medical History:  Past Medical History  Diagnosis Date  . Hypertension   . Vitamin D deficiency   . Hypogonadism male   . Elevated LDL cholesterol level   . Elevated hemoglobin A1c   . Hx of colonic polyp 11/20/2014   Surgical History:  Past Surgical History  Procedure Laterality Date  . Hip fracture surgery Right 1986    avulsion/chip fracture ?stress fracture  . Patella fracture surgery Right 1997  . C5-6 fusion     Family History:  Family History  Problem Relation Age of Onset  . Colon cancer Maternal Uncle   . Colon cancer Paternal Uncle   . Colon cancer Maternal Grandfather   . Colon cancer Paternal Grandfather    Social  History:   Social History  Substance Use Topics  . Smoking status: Former Smoker    Quit date: 09/20/1989  . Smokeless tobacco: Never Used  . Alcohol Use: No   Review of Systems:  Review of Systems  Constitutional: Negative.   HENT: Negative.   Eyes: Negative.   Respiratory: Negative.   Cardiovascular: Negative.   Gastrointestinal: Positive for heartburn. Negative for nausea, vomiting, abdominal pain, diarrhea, constipation, blood in stool and melena.  Genitourinary: Negative.   Musculoskeletal: Positive for joint pain. Negative for myalgias, back pain, falls and neck pain.  Skin: Negative.   Neurological: Negative.   Endo/Heme/Allergies: Negative.   Psychiatric/Behavioral: Negative.     Physical Exam: Estimated body mass index is 35.08 kg/(m^2) as calculated from the following:   Height as of this encounter: 6\' 1"  (1.854 m).   Weight as of this encounter: 265 lb 12.8 oz  (120.566 kg). BP 128/74 mmHg  Pulse 68  Temp(Src) 97.7 F (36.5 C) (Temporal)  Resp 16  Ht 6\' 1"  (1.854 m)  Wt 265 lb 12.8 oz (120.566 kg)  BMI 35.08 kg/m2  SpO2 97% General Appearance: Well nourished, in no apparent distress.  Eyes: PERRLA, EOMs, conjunctiva no swelling or erythema, normal fundi and vessels.  Sinuses: No Frontal/maxillary tenderness  ENT/Mouth: Ext aud canals clear, normal light reflex with TMs without erythema, bulging. Good dentition. No erythema, swelling, or exudate on post pharynx. Tonsils not swollen or erythematous. Hearing normal.  Neck: Supple, thyroid normal. No bruits  Respiratory: Respiratory effort normal, BS equal bilaterally without rales, rhonchi, wheezing or stridor.  Cardio: RRR without murmurs, rubs or gallops. Brisk peripheral pulses without edema.  Chest: symmetric, with normal excursions and percussion.  Abdomen: Soft, nontender, no guarding, rebound, hernias, masses, or organomegaly.  Lymphatics: Non tender without lymphadenopathy.  Genitourinary:  Musculoskeletal: Full ROM all peripheral extremities,5/5 strength, and normal gait. Right full ROM, sensory exam normal, motor exam normal and radial pulse intact , + right subacromial bursa tenderness to palpation.  Strength is normal and symmetric in arms. Skin: several skin tags, underarm and left leg. Warm, dry without rashes, lesions, ecchymosis. Neuro: Cranial nerves intact, reflexes equal bilaterally. Normal muscle tone, no cerebellar symptoms. Sensation intact.  Psych: Awake and oriented X 3, normal affect, Insight and Judgment appropriate.   EKG: WNL no changes. AORTA SCAN: WNL  Vicie Mutters 8:41 AM [facility]

## 2016-02-04 LAB — URINALYSIS, ROUTINE W REFLEX MICROSCOPIC
Bilirubin Urine: NEGATIVE
Glucose, UA: NEGATIVE
HGB URINE DIPSTICK: NEGATIVE
Ketones, ur: NEGATIVE
LEUKOCYTES UA: NEGATIVE
NITRITE: NEGATIVE
PROTEIN: NEGATIVE
Specific Gravity, Urine: 1.014 (ref 1.001–1.035)
pH: 7.5 (ref 5.0–8.0)

## 2016-02-04 LAB — TESTOSTERONE: Testosterone: 503 ng/dL (ref 250–827)

## 2016-02-04 LAB — TSH: TSH: 2.05 m[IU]/L (ref 0.40–4.50)

## 2016-02-04 LAB — MICROALBUMIN / CREATININE URINE RATIO
Creatinine, Urine: 105 mg/dL (ref 20–370)
MICROALB/CREAT RATIO: 5 ug/mg{creat} (ref <30)
Microalb, Ur: 0.5 mg/dL

## 2016-02-04 LAB — VITAMIN D 25 HYDROXY (VIT D DEFICIENCY, FRACTURES): VIT D 25 HYDROXY: 68 ng/mL (ref 30–100)

## 2016-02-04 LAB — PSA: PSA: 0.97 ng/mL (ref <=4.00)

## 2016-02-11 ENCOUNTER — Telehealth: Payer: Self-pay

## 2016-02-11 NOTE — Telephone Encounter (Signed)
Opened in error

## 2016-02-12 ENCOUNTER — Encounter: Payer: Self-pay | Admitting: Neurology

## 2016-02-12 ENCOUNTER — Ambulatory Visit (INDEPENDENT_AMBULATORY_CARE_PROVIDER_SITE_OTHER): Payer: BC Managed Care – PPO | Admitting: Neurology

## 2016-02-12 VITALS — BP 128/72 | HR 78 | Ht 73.0 in | Wt 258.0 lb

## 2016-02-12 DIAGNOSIS — G500 Trigeminal neuralgia: Secondary | ICD-10-CM | POA: Diagnosis not present

## 2016-02-12 NOTE — Progress Notes (Signed)
NEUROLOGY FOLLOW UP OFFICE NOTE  Donald Schwartz [id number]  HISTORY OF PRESENT ILLNESS: Donald Schwartz is a 50 year old right-handed man with hypertension, hyperlipidemia and OSA who follows up for right sided trigeminal neuralgia.  UPDATE: He is taking oxcarbazepine 600mg  twice daily.  He has been pain-free.  He is tolerating the medication.  Na from 02/03/16 showed Na 135.  BMP otherwise as also unremarkable.  HISTORY: Beginning in January, he started having pain involving the right side of the face.  It started in the right eye, then involving the right top of head, right side of nose and right upper teeth.  It is a shooting pain.  It is triggered by lightly touching his cheek, eating or the feeling of water running over his head.  Sometimes there is a slight tingling but usually not.  There is no numbness or facial weakness.  He was evaluated by the eye doctor and dentist, with normal exams.    Prior medications:  Lyrica.  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  PAST MEDICAL HISTORY: Past Medical History  Diagnosis Date  . Hypertension   . Vitamin D deficiency   . Hypogonadism male   . Elevated LDL cholesterol level   . Elevated hemoglobin A1c   . Hx of colonic polyp 11/20/2014    MEDICATIONS: Current Outpatient Prescriptions on File Prior to Visit  Medication Sig Dispense Refill  . aspirin 81 MG chewable tablet Chew by mouth daily.    Marland Kitchen diltiazem (CARDIZEM) 120 MG tablet TAKE ONE TABLET BY MOUTH ONCE DAILY 90 tablet 1  . fluticasone (FLONASE) 50 MCG/ACT nasal spray USE ONE SPRAY EACH NOSTRIL TWICE DAILY 16 g 3  . isometheptene-acetaminophen-dichloralphenazone (MIDRIN) 65-325-100 MG capsule Take 1 capsule by mouth 4 (four) times daily as needed for migraine. Maximum 5 capsules in 12 hours for migraine headaches, 8 capsules in 24 hours for tension headaches.    Marland Kitchen lisinopril (PRINIVIL,ZESTRIL) 40 MG tablet Take 1 tablet (40 mg total) by  mouth daily. 90 tablet 1  . Magnesium 250 MG TABS Take 250 mg by mouth daily.    . Omega-3 Fatty Acids (FISH OIL) 1000 MG CAPS Take by mouth 4 (four) times daily.    Marland Kitchen oxcarbazepine (TRILEPTAL) 600 MG tablet TAKE ONE TABLET BY MOUTH TWICE DAILY 60 tablet 3  . Red Yeast Rice Extract (RED YEAST RICE PO) Take 1,200 mg by mouth 2 (two) times daily.     Marland Kitchen testosterone cypionate (DEPOTESTOSTERONE CYPIONATE) 200 MG/ML injection INJECT 2MLS INTO THE MUSCLE EVERY 2 WEEKS 10 mL 2  . valACYclovir (VALTREX) 1000 MG tablet TAKE ONE TABLET BY MOUTH ONCE DAILY 90 tablet 1  . VITAMIN D, ERGOCALCIFEROL, PO Take 5,000 Int'l Units/day by mouth.     No current facility-administered medications on file prior to visit.    ALLERGIES: Allergies  Allergen Reactions  . Toprol Xl [Metoprolol Tartrate]     ED    FAMILY HISTORY: Family History  Problem Relation Age of Onset  . Colon cancer Maternal Uncle   . Colon cancer Paternal Uncle   . Colon cancer Maternal Grandfather   . Colon cancer Paternal Grandfather     SOCIAL HISTORY: Social History   Social History  . Marital Status: Married    Spouse Name: N/A  . Number of Children: N/A  . Years of Education: N/A   Occupational History  . Not on file.   Social History Main Topics  . Smoking status:  Former Smoker    Quit date: 09/20/1989  . Smokeless tobacco: Never Used  . Alcohol Use: No  . Drug Use: No  . Sexual Activity:    Partners: Female   Other Topics Concern  . Not on file   Social History Narrative    REVIEW OF SYSTEMS: Constitutional: No fevers, chills, or sweats, no generalized fatigue, change in appetite Eyes: No visual changes, double vision, eye pain Ear, nose and throat: No hearing loss, ear pain, nasal congestion, sore throat Cardiovascular: No chest pain, palpitations Respiratory:  No shortness of breath at rest or with exertion, wheezes GastrointestinaI: No nausea, vomiting, diarrhea, abdominal pain, fecal  incontinence Genitourinary:  No dysuria, urinary retention or frequency Musculoskeletal:  No neck pain, back pain Integumentary: No rash, pruritus, skin lesions Neurological: as above Psychiatric: No depression, insomnia, anxiety Endocrine: No palpitations, fatigue, diaphoresis, mood swings, change in appetite, change in weight, increased thirst Hematologic/Lymphatic:  No purpura, petechiae. Allergic/Immunologic: no itchy/runny eyes, nasal congestion, recent allergic reactions, rashes  PHYSICAL EXAM: Filed Vitals:   02/12/16 0743  BP: 128/72  Pulse: 78   General: No acute distress.  Patient appears well-groomed.   Head:  Normocephalic/atraumatic Eyes:  Fundi examined but not visualized Neck: supple, no paraspinal tenderness, full range of motion Heart:  Regular rate and rhythm Lungs:  Clear to auscultation bilaterally Back: No paraspinal tenderness Neurological Exam: alert and oriented to person, place, and time. Attention span and concentration intact, recent and remote memory intact, fund of knowledge intact.  Speech fluent and not dysarthric, language intact.  CN II-XII intact. Bulk and tone normal, muscle strength 5/5 throughout.  Sensation to light touch, temperature and vibration intact.  Deep tendon reflexes 2+ throughout.  Finger to nose and heel to shin testing intact.  Gait normal, Romberg negative.  IMPRESSION: Right-sided trigeminal neuralgia, stable  PLAN: 1.  Continue the oxcarbazepine 600mg  twice daily 2.  Repeat BMP in 6 months with follow up soon afterwards 3.  Call with questions or concerns.  15 minutes spent face to face with patient, over 50% spent discussing management.  Metta Clines, DO  CC:  [name], MD

## 2016-02-12 NOTE — Patient Instructions (Signed)
1.  Continue the oxcarbazepine 600mg  twice daily 2.  Repeat BMP in 6 months with follow up soon afterwards 3.  Call with questions or concerns.

## 2016-04-02 ENCOUNTER — Other Ambulatory Visit: Payer: Self-pay | Admitting: Internal Medicine

## 2016-04-21 ENCOUNTER — Other Ambulatory Visit: Payer: Self-pay | Admitting: Neurology

## 2016-05-06 ENCOUNTER — Encounter: Payer: Self-pay | Admitting: Physician Assistant

## 2016-05-29 IMAGING — US US ABDOMEN COMPLETE
1 series · 14 of 25 positions shown · non-contrast
Comparison: None.

CLINICAL DATA: Right upper quadrant abdominal pain

EXAM:
ULTRASOUND ABDOMEN COMPLETE

[Series 1: us abdomen complete · 0.31mm/px · 14 of 87 slices shown]
[im 1/87]
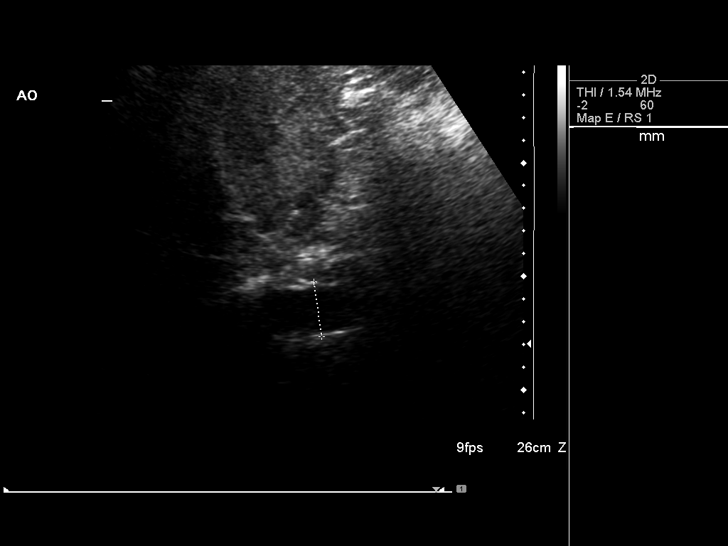
[im 8/87]
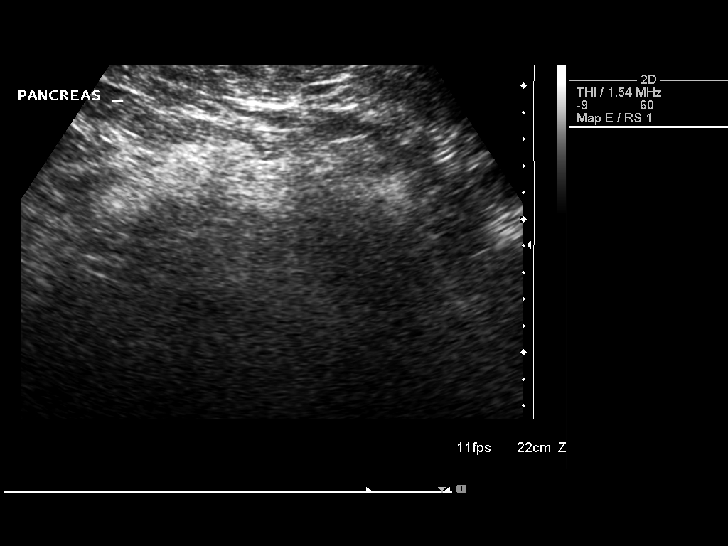
[im 15/87]
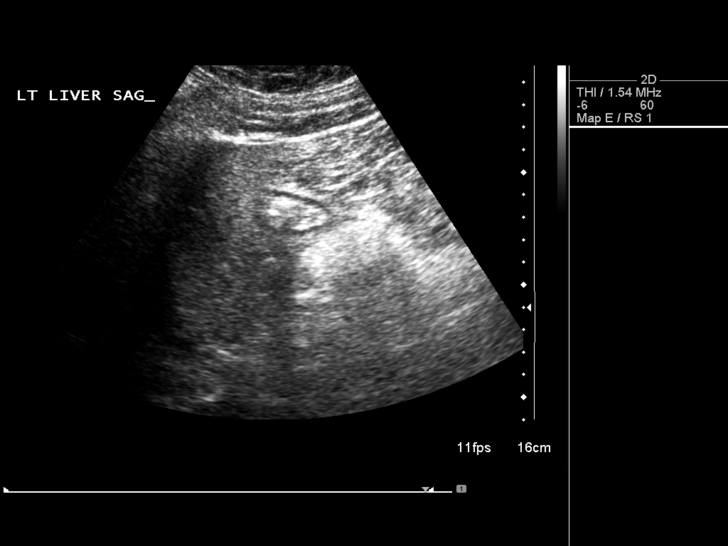
[im 22/87]
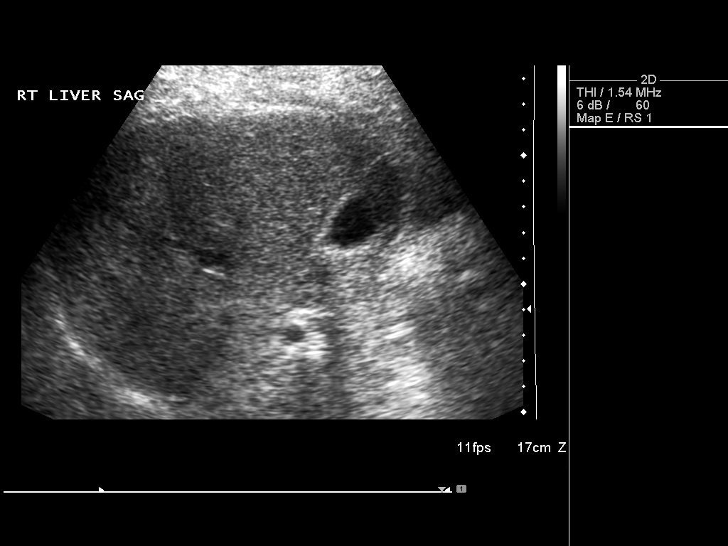
[im 29/87]
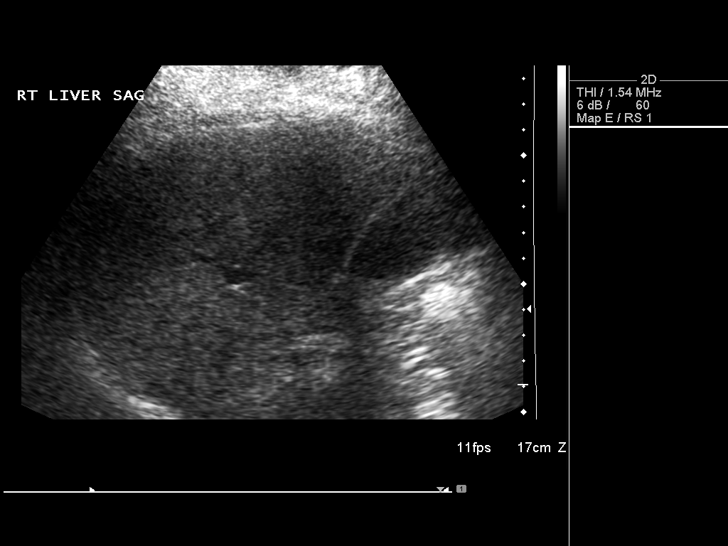
[im 33/87]
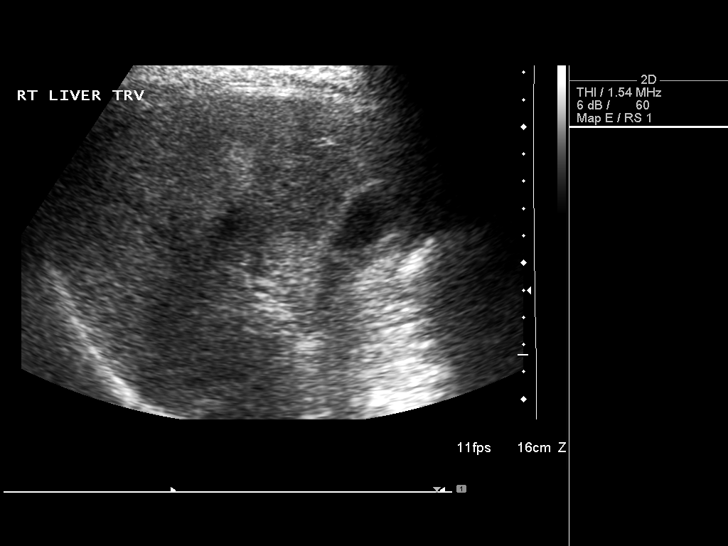
[im 40/87]
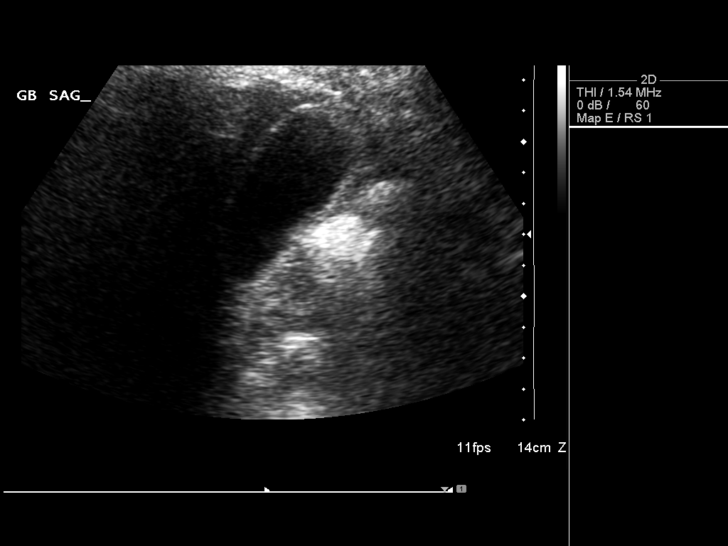
[im 47/87]
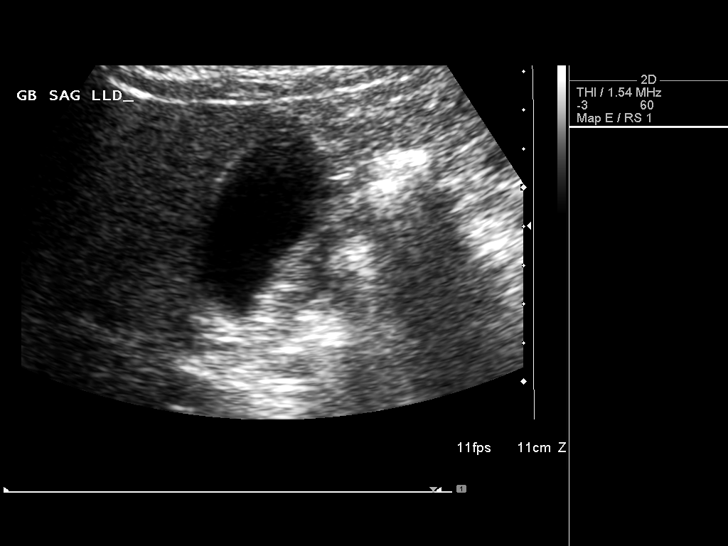
[im 54/87]
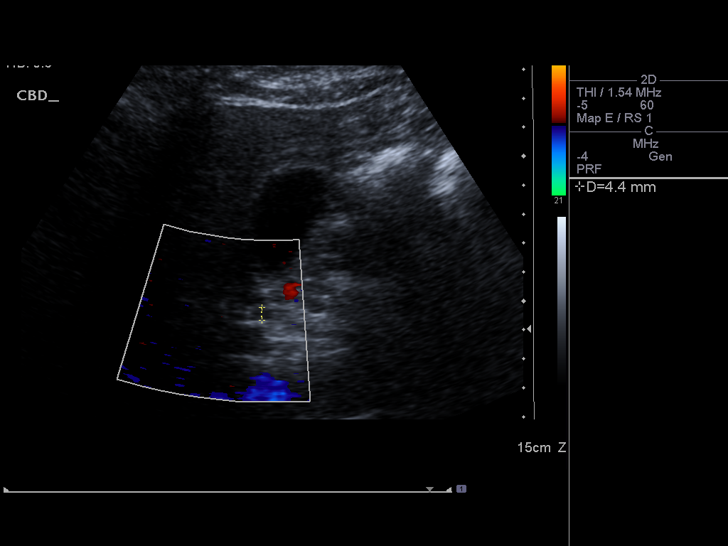
[im 58/87]
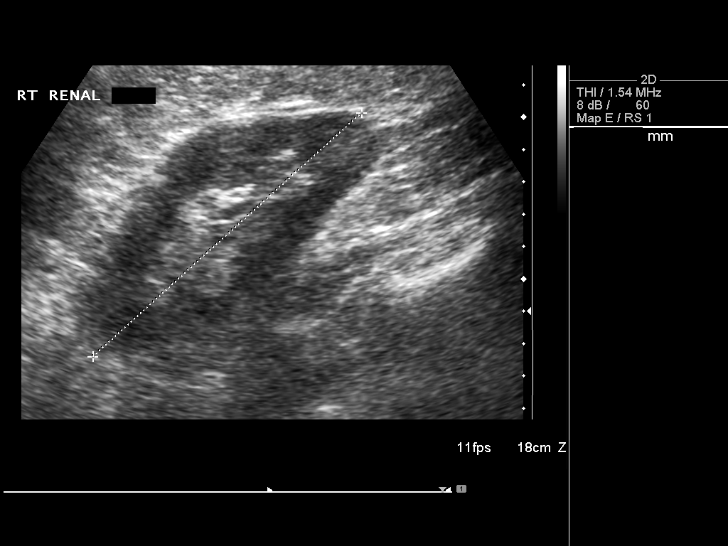
[im 65/87]
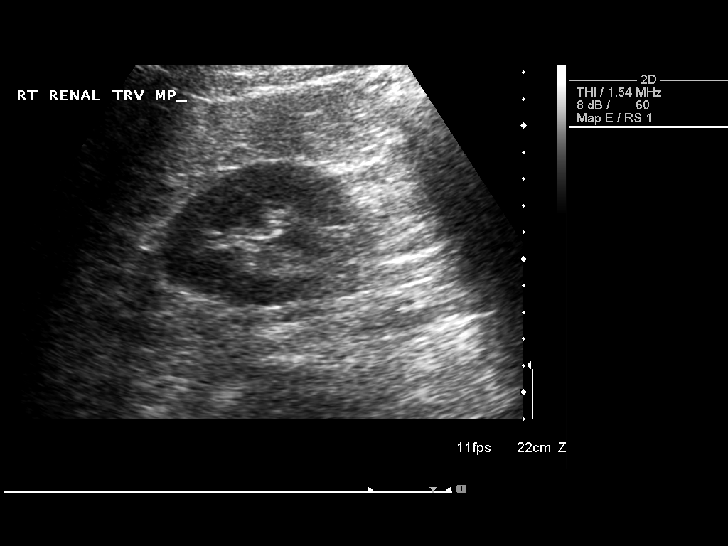
[im 72/87]
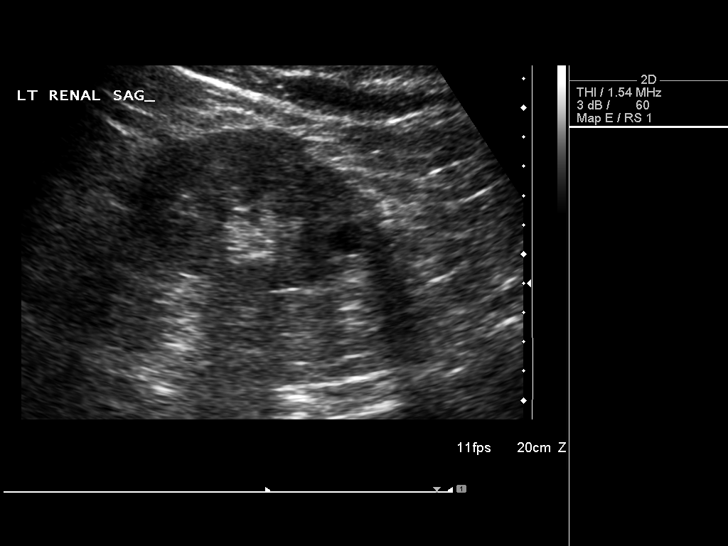
[im 79/87]
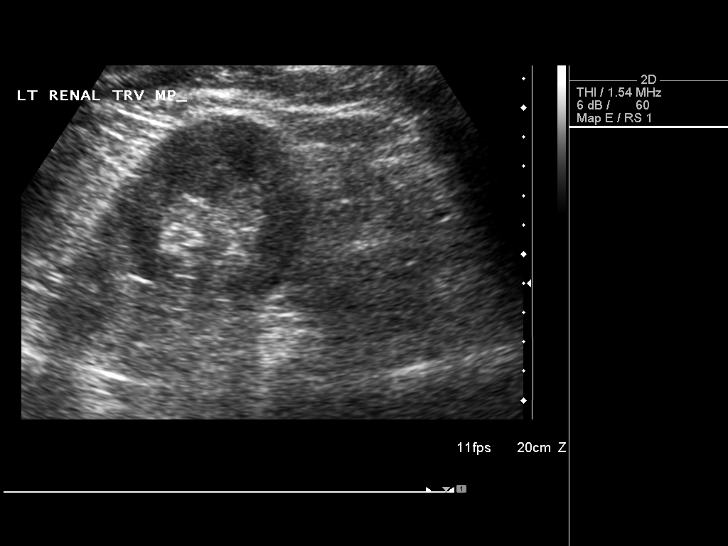
[im 87/87]
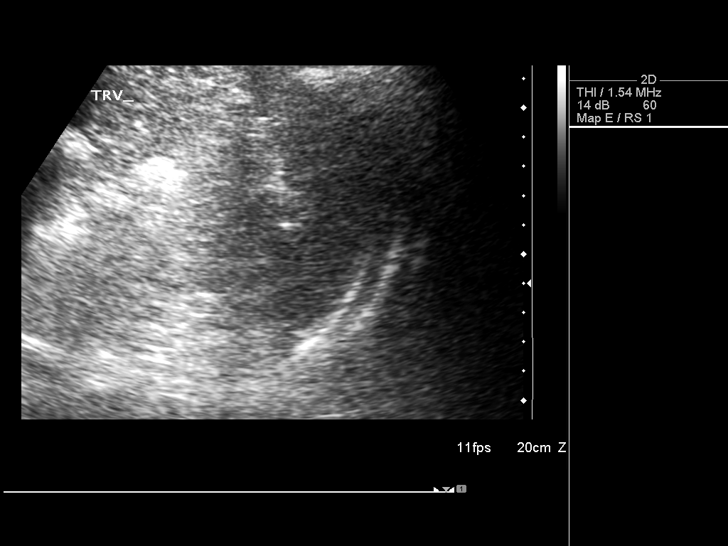

[14 of 25 positions shown; findings below may reference images not displayed]

FINDINGS: Gallbladder: No gallstones or wall thickening visualized. No
sonographic Murphy sign noted.

Common bile duct: Diameter: 4.4 mm

Liver: . The hepatic echotexture is somewhat heterogeneous. There is
no focal mass or ductal dilation.

IVC: No abnormality visualized.

Pancreas: Bowel gas limited evaluation of the pancreas.

Spleen: Size and appearance within normal limits.

Right Kidney: Length: 11.6 cm. Echogenicity within normal limits. No
mass or hydronephrosis visualized.

Left Kidney: Length: 11.6 cm.. Echogenicity within normal limits. No
hydronephrosis visualized. There is a 1.3 cm diameter simple
appearing lower pole cyst.

Abdominal aorta: No aneurysm visualized.

Other findings: None.
IMPRESSION: 1. There is no acute hepatobiliary disease. There may be fatty
infiltrative changes of the liver.
2. There is a simple cyst in the lower pole of the left kidney but
there is no acute abnormality of the kidneys.

## 2016-05-31 ENCOUNTER — Encounter: Payer: Self-pay | Admitting: Physician Assistant

## 2016-05-31 ENCOUNTER — Ambulatory Visit (INDEPENDENT_AMBULATORY_CARE_PROVIDER_SITE_OTHER): Payer: BC Managed Care – PPO | Admitting: Physician Assistant

## 2016-05-31 VITALS — BP 126/78 | HR 72 | Temp 97.5°F | Resp 16 | Ht 73.0 in | Wt 257.8 lb

## 2016-05-31 DIAGNOSIS — E291 Testicular hypofunction: Secondary | ICD-10-CM | POA: Diagnosis not present

## 2016-05-31 DIAGNOSIS — N182 Chronic kidney disease, stage 2 (mild): Secondary | ICD-10-CM | POA: Diagnosis not present

## 2016-05-31 DIAGNOSIS — L918 Other hypertrophic disorders of the skin: Secondary | ICD-10-CM

## 2016-05-31 DIAGNOSIS — Z79899 Other long term (current) drug therapy: Secondary | ICD-10-CM

## 2016-05-31 DIAGNOSIS — I10 Essential (primary) hypertension: Secondary | ICD-10-CM

## 2016-05-31 DIAGNOSIS — E785 Hyperlipidemia, unspecified: Secondary | ICD-10-CM

## 2016-05-31 DIAGNOSIS — E559 Vitamin D deficiency, unspecified: Secondary | ICD-10-CM

## 2016-05-31 DIAGNOSIS — R7309 Other abnormal glucose: Secondary | ICD-10-CM | POA: Diagnosis not present

## 2016-05-31 MED ORDER — TESTOSTERONE CYPIONATE 200 MG/ML IM SOLN: INTRAMUSCULAR | 2 refills | Status: DC

## 2016-05-31 NOTE — Progress Notes (Signed)
RX CALLED INTO [facility].

## 2016-05-31 NOTE — Progress Notes (Signed)
Patient ID: Donald Schwartz, male   DOB: [birth year], 50 y.o.   MRN: [id number]  Assessment and Plan:  Hypertension:  - continue medications -monitor blood pressure at home.  -Continue DASH diet.   -Reminder to go to the ER if any CP, SOB, nausea, dizziness, severe HA, changes vision/speech, left arm numbness and tingling, and jaw pain.  Cholesterol: -Continue diet and exercise.  -Check cholesterol.   Pre-diabetes: -Continue diet and exercise.  -Check A1C  Vitamin D Def: -check level -continue medications.   Obesity -discussed weight gain and diet and exercise  Skin tag removal Tolerated well, 5 total removed, area cleaned with alcohol, 1 % lidocaine no epi was used and 5 total skin tags were removed with minimal bleeding.   Continue diet and meds as discussed. Further disposition pending results of labs.  HPI 50 y.o. male  presents for 3 month follow up with hypertension, hyperlipidemia, prediabetes and vitamin D. Patient also has some skin tags that he would like removed at right arm, left leg, and near eye.    His blood pressure has been controlled at home, today their BP is BP: 126/78.   He does workout, started back at gym. He denies chest pain, shortness of breath, dizziness.    He is on cholesterol medication and denies myalgias. His cholesterol is at goal. The cholesterol last visit was:   Lab Results  Component Value Date   CHOL 161 02/03/2016   HDL 39 (L) 02/03/2016   LDLCALC 101 02/03/2016   TRIG 105 02/03/2016   CHOLHDL 4.1 02/03/2016    He has been working on diet and exercise for prediabetes, and denies foot ulcerations, hyperglycemia, hypoglycemia , increased appetite, nausea, paresthesia of the feet, polydipsia, polyuria, visual disturbances, vomiting and weight loss. Last A1C in the office was:  Lab Results  Component Value Date   HGBA1C 5.8 (H) 02/03/2016   Patient is on Vitamin D supplement.  Lab Results  Component Value Date   VD25OH 68 02/03/2016     Following Dr. Tomi Likens for trigeminal neuralgia.  BMI is Body mass index is 34.01 kg/m., he is working on diet and exercise. Wt Readings from Last 3 Encounters:  05/31/16 257 lb 12.8 oz (116.9 kg)  02/12/16 258 lb (117 kg)  02/03/16 265 lb 12.8 oz (120.6 kg)    Current Medications:  Current Outpatient Prescriptions on File Prior to Visit  Medication Sig Dispense Refill  . aspirin 81 MG chewable tablet Chew by mouth daily.    Marland Kitchen diltiazem (CARDIZEM) 120 MG tablet TAKE ONE TABLET BY MOUTH ONCE DAILY 90 tablet 0  . fluticasone (FLONASE) 50 MCG/ACT nasal spray USE ONE SPRAY EACH NOSTRIL TWICE DAILY 16 g 3  . isometheptene-acetaminophen-dichloralphenazone (MIDRIN) 65-325-100 MG capsule Take 1 capsule by mouth 4 (four) times daily as needed for migraine. Maximum 5 capsules in 12 hours for migraine headaches, 8 capsules in 24 hours for tension headaches.    Marland Kitchen lisinopril (PRINIVIL,ZESTRIL) 40 MG tablet Take 1 tablet (40 mg total) by mouth daily. 90 tablet 1  . Magnesium 250 MG TABS Take 250 mg by mouth daily.    . Omega-3 Fatty Acids (FISH OIL) 1000 MG CAPS Take by mouth 4 (four) times daily.    Marland Kitchen oxcarbazepine (TRILEPTAL) 600 MG tablet TAKE ONE TABLET BY MOUTH TWICE DAILY 60 tablet 5  . Red Yeast Rice Extract (RED YEAST RICE PO) Take 1,200 mg by mouth 2 (two) times daily.     Marland Kitchen testosterone cypionate (DEPOTESTOSTERONE CYPIONATE)  200 MG/ML injection INJECT 2MLS INTO THE MUSCLE EVERY 2 WEEKS 10 mL 2  . valACYclovir (VALTREX) 1000 MG tablet TAKE ONE TABLET BY MOUTH ONCE DAILY 90 tablet 1  . VITAMIN D, ERGOCALCIFEROL, PO Take 5,000 Int'l Units/day by mouth.     No current facility-administered medications on file prior to visit.     Medical History:  Past Medical History:  Diagnosis Date  . Elevated hemoglobin A1c   . Elevated LDL cholesterol level   . Hx of colonic polyp 11/20/2014  . Hypertension   . Hypogonadism male   . Vitamin D deficiency     Allergies:  Allergies  Allergen Reactions   . Toprol Xl [Metoprolol Tartrate]     ED     Review of Systems:  Review of Systems  Constitutional: Negative for chills, fever and malaise/fatigue.  HENT: Negative for congestion, ear pain and sore throat.   Eyes: Negative.   Respiratory: Negative for cough, shortness of breath and wheezing.   Cardiovascular: Negative for chest pain, palpitations and leg swelling.  Gastrointestinal: Negative for blood in stool, constipation, diarrhea, heartburn and melena.  Genitourinary: Negative.   Skin: Negative.   Neurological: Negative for dizziness, sensory change, loss of consciousness and headaches.  Psychiatric/Behavioral: Negative for depression. The patient is not nervous/anxious and does not have insomnia.     Family history- Review and unchanged  Social history- Review and unchanged  Physical Exam: BP 126/78   Pulse 72   Temp 97.5 F (36.4 C)   Resp 16   Ht 6\' 1"  (1.854 m)   Wt 257 lb 12.8 oz (116.9 kg)   BMI 34.01 kg/m  Wt Readings from Last 3 Encounters:  05/31/16 257 lb 12.8 oz (116.9 kg)  02/12/16 258 lb (117 kg)  02/03/16 265 lb 12.8 oz (120.6 kg)    General Appearance: Well nourished well developed, in no apparent distress. Eyes: PERRLA, EOMs, conjunctiva no swelling or erythema ENT/Mouth: Ear canals normal without obstruction, swelling, erythma, discharge.  TMs normal bilaterally.  Oropharynx moist, clear, without exudate, or postoropharyngeal swelling. Neck: Supple, thyroid normal,no cervical adenopathy  Respiratory: Respiratory effort normal, Breath sounds clear A&P without rhonchi, wheeze, or rale.  No retractions, no accessory usage. Cardio: RRR with no MRGs. Brisk peripheral pulses without edema.  Abdomen: Soft, + BS,  Non tender, no guarding, rebound, hernias, masses. Musculoskeletal: Full ROM, 5/5 strength, Normal gait Skin: Warm, dry without rashes, lesions, ecchymosis.  Neuro: Awake and oriented X 3, Cranial nerves intact. Normal muscle tone, no cerebellar  symptoms. Psych: Normal affect, Insight and Judgment appropriate.    Vicie Mutters, PA-C 4:38 PM [facility]

## 2016-06-23 ENCOUNTER — Other Ambulatory Visit: Payer: Self-pay | Admitting: Internal Medicine

## 2016-08-02 ENCOUNTER — Telehealth: Payer: Self-pay

## 2016-08-02 DIAGNOSIS — Z79899 Other long term (current) drug therapy: Secondary | ICD-10-CM

## 2016-08-02 NOTE — Telephone Encounter (Signed)
-----   Message from Amada Kingfisher, [place] sent at 02/12/2016  7:52 AM EDT ----- BMP

## 2016-08-02 NOTE — Telephone Encounter (Signed)
Spoke with patient. He will come get labs before appointment.

## 2016-08-16 ENCOUNTER — Telehealth: Payer: Self-pay

## 2016-08-16 DIAGNOSIS — Z79899 Other long term (current) drug therapy: Secondary | ICD-10-CM

## 2016-08-16 DIAGNOSIS — G500 Trigeminal neuralgia: Secondary | ICD-10-CM

## 2016-08-16 NOTE — Telephone Encounter (Signed)
Pt going to have drawn at PCP.

## 2016-08-17 ENCOUNTER — Other Ambulatory Visit: Payer: BC Managed Care – PPO

## 2016-08-17 DIAGNOSIS — I10 Essential (primary) hypertension: Secondary | ICD-10-CM

## 2016-08-17 DIAGNOSIS — R7309 Other abnormal glucose: Secondary | ICD-10-CM | POA: Diagnosis not present

## 2016-08-17 DIAGNOSIS — Z79899 Other long term (current) drug therapy: Secondary | ICD-10-CM | POA: Diagnosis not present

## 2016-08-17 DIAGNOSIS — E559 Vitamin D deficiency, unspecified: Secondary | ICD-10-CM

## 2016-08-17 DIAGNOSIS — E785 Hyperlipidemia, unspecified: Secondary | ICD-10-CM | POA: Diagnosis not present

## 2016-08-17 DIAGNOSIS — E291 Testicular hypofunction: Secondary | ICD-10-CM | POA: Diagnosis not present

## 2016-08-17 DIAGNOSIS — R972 Elevated prostate specific antigen [PSA]: Secondary | ICD-10-CM | POA: Diagnosis not present

## 2016-08-17 DIAGNOSIS — M109 Gout, unspecified: Secondary | ICD-10-CM | POA: Diagnosis not present

## 2016-08-17 DIAGNOSIS — Z125 Encounter for screening for malignant neoplasm of prostate: Secondary | ICD-10-CM

## 2016-08-17 DIAGNOSIS — Z0001 Encounter for general adult medical examination with abnormal findings: Secondary | ICD-10-CM

## 2016-08-17 DIAGNOSIS — N182 Chronic kidney disease, stage 2 (mild): Secondary | ICD-10-CM

## 2016-08-17 LAB — CBC WITH DIFFERENTIAL/PLATELET
BASOS ABS: 124 {cells}/uL (ref 0–200)
BASOS PCT: 2 %
EOS ABS: 186 {cells}/uL (ref 15–500)
EOS PCT: 3 %
HCT: 46.5 % (ref 38.5–50.0)
HEMOGLOBIN: 15.7 g/dL (ref 13.2–17.1)
LYMPHS ABS: 1612 {cells}/uL (ref 850–3900)
Lymphocytes Relative: 26 %
MCH: 29.5 pg (ref 27.0–33.0)
MCHC: 33.8 g/dL (ref 32.0–36.0)
MCV: 87.4 fL (ref 80.0–100.0)
MPV: 8.7 fL (ref 7.5–12.5)
Monocytes Absolute: 930 cells/uL (ref 200–950)
Monocytes Relative: 15 %
NEUTROS ABS: 3348 {cells}/uL (ref 1500–7800)
Neutrophils Relative %: 54 %
PLATELETS: 271 10*3/uL (ref 140–400)
RBC: 5.32 MIL/uL (ref 4.20–5.80)
RDW: 15.6 % — ABNORMAL HIGH (ref 11.0–15.0)
WBC: 6.2 10*3/uL (ref 3.8–10.8)

## 2016-08-18 LAB — BASIC METABOLIC PANEL WITH GFR
BUN: 11 mg/dL (ref 7–25)
CHLORIDE: 102 mmol/L (ref 98–110)
CO2: 26 mmol/L (ref 20–31)
CREATININE: 1.22 mg/dL (ref 0.70–1.33)
Calcium: 9.5 mg/dL (ref 8.6–10.3)
GFR, Est African American: 78 mL/min (ref >=60)
GFR, Est Non African American: 68 mL/min (ref >=60)
Glucose, Bld: 91 mg/dL (ref 65–99)
POTASSIUM: 4.1 mmol/L (ref 3.5–5.3)
SODIUM: 137 mmol/L (ref 135–146)

## 2016-08-18 LAB — URIC ACID: Uric Acid, Serum: 4.6 mg/dL (ref 4.0–8.0)

## 2016-08-18 LAB — MICROALBUMIN / CREATININE URINE RATIO
Creatinine, Urine: 93 mg/dL (ref 20–370)
MICROALB UR: 0.2 mg/dL
Microalb Creat Ratio: 2 mcg/mg creat (ref <30)

## 2016-08-18 LAB — URINALYSIS, ROUTINE W REFLEX MICROSCOPIC
BILIRUBIN URINE: NEGATIVE
Glucose, UA: NEGATIVE
HGB URINE DIPSTICK: NEGATIVE
KETONES UR: NEGATIVE
Leukocytes, UA: NEGATIVE
NITRITE: NEGATIVE
PH: 7 (ref 5.0–8.0)
Protein, ur: NEGATIVE
Specific Gravity, Urine: 1.012 (ref 1.001–1.035)

## 2016-08-18 LAB — HEPATIC FUNCTION PANEL
ALK PHOS: 61 U/L (ref 40–115)
ALT: 28 U/L (ref 9–46)
AST: 32 U/L (ref 10–35)
Albumin: 4.5 g/dL (ref 3.6–5.1)
BILIRUBIN DIRECT: 0.1 mg/dL (ref <=0.2)
BILIRUBIN INDIRECT: 0.2 mg/dL (ref 0.2–1.2)
BILIRUBIN TOTAL: 0.3 mg/dL (ref 0.2–1.2)
Total Protein: 7.3 g/dL (ref 6.1–8.1)

## 2016-08-18 LAB — LIPID PANEL
CHOL/HDL RATIO: 3.9 ratio (ref <5.0)
Cholesterol: 159 mg/dL (ref <200)
HDL: 41 mg/dL (ref >40)
LDL CALC: 93 mg/dL (ref <100)
Triglycerides: 127 mg/dL (ref <150)
VLDL: 25 mg/dL (ref <30)

## 2016-08-18 LAB — HEMOGLOBIN A1C
HEMOGLOBIN A1C: 5.5 % (ref <5.7)
MEAN PLASMA GLUCOSE: 111 mg/dL

## 2016-08-18 LAB — INSULIN, FASTING: Insulin fasting, serum: 13.4 u[IU]/mL (ref 2.0–19.6)

## 2016-08-18 LAB — TSH: TSH: 2.61 m[IU]/L (ref 0.40–4.50)

## 2016-08-18 LAB — PSA: PSA: 0.8 ng/mL (ref <=4.0)

## 2016-08-18 LAB — TESTOSTERONE: Testosterone: 627 ng/dL (ref 250–827)

## 2016-08-18 LAB — MAGNESIUM: Magnesium: 2.2 mg/dL (ref 1.5–2.5)

## 2016-08-19 LAB — VITAMIN D 25 HYDROXY (VIT D DEFICIENCY, FRACTURES): Vit D, 25-Hydroxy: 64 ng/mL (ref 30–100)

## 2016-08-23 ENCOUNTER — Encounter: Payer: Self-pay | Admitting: Neurology

## 2016-08-23 ENCOUNTER — Ambulatory Visit (INDEPENDENT_AMBULATORY_CARE_PROVIDER_SITE_OTHER): Payer: BC Managed Care – PPO | Admitting: Neurology

## 2016-08-23 VITALS — BP 126/68 | HR 92 | Ht 73.0 in | Wt 253.0 lb

## 2016-08-23 DIAGNOSIS — G500 Trigeminal neuralgia: Secondary | ICD-10-CM | POA: Diagnosis not present

## 2016-08-23 NOTE — Patient Instructions (Signed)
1.  Continue oxcarbazepine 600mg  twice daily and B12.  If pain worsens, contact us (MyChart or phone) and we can make medication adjustments. 2.  Follow up in 6 months.

## 2016-08-23 NOTE — Progress Notes (Signed)
NEUROLOGY FOLLOW UP OFFICE NOTE  Donald Schwartz [id number]  HISTORY OF PRESENT ILLNESS: Donald Schwartz is a 50 year old right-handed man with hypertension, hyperlipidemia and OSA who follows up for right sided trigeminal neuralgia.   UPDATE: He is taking oxcarbazepine 600mg  twice daily.  In July, he started having mild right sided pain again while eating, which gradually got worse.  By September, chewing food and talking was painful.  He started taking 5000 mcg daily of liquid B12, which has helped.  He now notes mild pain when he brushes his teeth or drys his hair quickly with the towel, but it is manageable.  Na from 08/17/16 was 137.   HISTORY: Beginning in January, he started having pain involving the right side of the face.  It started in the right eye, then involving the right top of head, right side of nose and right upper teeth.  It is a shooting pain.  It is triggered by lightly touching his cheek, eating or the feeling of water running over his head.  Sometimes there is a slight tingling but usually not.  There is no numbness or facial weakness.  He was evaluated by the eye doctor and dentist, with normal exams.     Prior medications:  Lyrica.  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  PAST MEDICAL HISTORY: Past Medical History:  Diagnosis Date  . Elevated hemoglobin A1c   . Elevated LDL cholesterol level   . Hx of colonic polyp 11/20/2014  . Hypertension   . Hypogonadism male   . Vitamin D deficiency     MEDICATIONS: Current Outpatient Prescriptions on File Prior to Visit  Medication Sig Dispense Refill  . aspirin 81 MG chewable tablet Chew by mouth daily.    Marland Kitchen diltiazem (CARDIZEM) 120 MG tablet TAKE ONE TABLET BY MOUTH ONCE DAILY 90 tablet 1  . fluticasone (FLONASE) 50 MCG/ACT nasal spray USE ONE SPRAY EACH NOSTRIL TWICE DAILY 16 g 3  . lisinopril (PRINIVIL,ZESTRIL) 40 MG tablet Take 1 tablet (40 mg total) by mouth daily. 90  tablet 1  . Magnesium 250 MG TABS Take 250 mg by mouth daily.    . Omega-3 Fatty Acids (FISH OIL) 1000 MG CAPS Take by mouth 4 (four) times daily.    Marland Kitchen oxcarbazepine (TRILEPTAL) 600 MG tablet TAKE ONE TABLET BY MOUTH TWICE DAILY 60 tablet 5  . Red Yeast Rice Extract (RED YEAST RICE PO) Take 1,200 mg by mouth 2 (two) times daily.     Marland Kitchen testosterone cypionate (DEPOTESTOSTERONE CYPIONATE) 200 MG/ML injection INJECT 2MLS INTO THE MUSCLE EVERY 2 WEEKS 10 mL 2  . valACYclovir (VALTREX) 1000 MG tablet TAKE ONE TABLET BY MOUTH ONCE DAILY 90 tablet 1  . VITAMIN D, ERGOCALCIFEROL, PO Take 5,000 Int'l Units/day by mouth.    . [DISCONTINUED] isometheptene-acetaminophen-dichloralphenazone (MIDRIN) 65-325-100 MG capsule Take 1 capsule by mouth 4 (four) times daily as needed for migraine. Maximum 5 capsules in 12 hours for migraine headaches, 8 capsules in 24 hours for tension headaches.     No current facility-administered medications on file prior to visit.     ALLERGIES: Allergies  Allergen Reactions  . Toprol Xl [Metoprolol Tartrate]     ED    FAMILY HISTORY: Family History  Problem Relation Age of Onset  . Colon cancer Maternal Uncle   . Colon cancer Paternal Uncle   . Colon cancer Maternal Grandfather   . Colon cancer Paternal Grandfather     SOCIAL  HISTORY: Social History   Social History  . Marital status: Married    Spouse name: N/A  . Number of children: N/A  . Years of education: N/A   Occupational History  . Not on file.   Social History Main Topics  . Smoking status: Former Smoker    Quit date: 09/20/1989  . Smokeless tobacco: Never Used  . Alcohol use No  . Drug use: No  . Sexual activity: Yes    Partners: Female   Other Topics Concern  . Not on file   Social History Narrative  . No narrative on file    REVIEW OF SYSTEMS: Constitutional: No fevers, chills, or sweats, no generalized fatigue, change in appetite Eyes: No visual changes, double vision, eye  pain Ear, nose and throat: No hearing loss, ear pain, nasal congestion, sore throat Cardiovascular: No chest pain, palpitations Respiratory:  No shortness of breath at rest or with exertion, wheezes GastrointestinaI: No nausea, vomiting, diarrhea, abdominal pain, fecal incontinence Genitourinary:  No dysuria, urinary retention or frequency Musculoskeletal:  No neck pain, back pain Integumentary: No rash, pruritus, skin lesions Neurological: as above Psychiatric: No depression, insomnia, anxiety Endocrine: No palpitations, fatigue, diaphoresis, mood swings, change in appetite, change in weight, increased thirst Hematologic/Lymphatic:  No purpura, petechiae. Allergic/Immunologic: no itchy/runny eyes, nasal congestion, recent allergic reactions, rashes  PHYSICAL EXAM: Vitals:   08/23/16 1408  BP: 126/68  Pulse: 92   General: No acute distress.  Patient appears well-groomed.   Head:  Normocephalic/atraumatic Eyes:  Fundi examined but not visualized Neck: supple, no paraspinal tenderness, full range of motion Heart:  Regular rate and rhythm Lungs:  Clear to auscultation bilaterally Back: No paraspinal tenderness Neurological Exam: alert and oriented to person, place, and time. Attention span and concentration intact, recent and remote memory intact, fund of knowledge intact.  Speech fluent and not dysarthric, language intact.  CN II-XII intact. Bulk and tone normal, muscle strength 5/5 throughout.  Sensation to light touch  intact.  Deep tendon reflexes 2+ throughout.  Finger to nose testing intact.  Gait normal  IMPRESSION: Right sided trigeminal neuralgia  PLAN: 1.  Continue oxcarbazepine 600mg  twice daily.  If pain gets any worse, he should contact me and we can increase dose. 2.  5039mcg daily of B12 is a very high dose.  I recommended to at least scale back the dose to 1074mcg daily. 3.  Follow up in 6 months but contact us sooner if we need to make any dose adjustments.  15  minutes spent face to face with patient, over 50% spent counseling.  Metta Clines, DO  CC:  Donald Pinto, MD

## 2016-09-06 ENCOUNTER — Encounter: Payer: Self-pay | Admitting: Physician Assistant

## 2016-09-06 ENCOUNTER — Ambulatory Visit (INDEPENDENT_AMBULATORY_CARE_PROVIDER_SITE_OTHER): Payer: BC Managed Care – PPO | Admitting: Physician Assistant

## 2016-09-06 VITALS — BP 138/74 | HR 73 | Temp 97.7°F | Resp 16 | Ht 73.0 in | Wt 259.0 lb

## 2016-09-06 DIAGNOSIS — E291 Testicular hypofunction: Secondary | ICD-10-CM

## 2016-09-06 DIAGNOSIS — E785 Hyperlipidemia, unspecified: Secondary | ICD-10-CM | POA: Diagnosis not present

## 2016-09-06 DIAGNOSIS — R7309 Other abnormal glucose: Secondary | ICD-10-CM

## 2016-09-06 DIAGNOSIS — N182 Chronic kidney disease, stage 2 (mild): Secondary | ICD-10-CM

## 2016-09-06 DIAGNOSIS — I10 Essential (primary) hypertension: Secondary | ICD-10-CM | POA: Diagnosis not present

## 2016-09-06 DIAGNOSIS — E559 Vitamin D deficiency, unspecified: Secondary | ICD-10-CM | POA: Diagnosis not present

## 2016-09-06 DIAGNOSIS — Z79899 Other long term (current) drug therapy: Secondary | ICD-10-CM

## 2016-09-06 MED ORDER — VALACYCLOVIR HCL 1 G PO TABS: 1000.0000 mg | ORAL_TABLET | Freq: Every day | ORAL | 1 refills | Status: DC

## 2016-09-06 NOTE — Progress Notes (Signed)
Patient ID: Donald Schwartz, male   DOB: 1966-06-18, 50 y.o.   MRN: [id number]  Assessment and Plan:  Hypertension:  - continue medications -monitor blood pressure at home.  -Continue DASH diet.   -Reminder to go to the ER if any CP, SOB, nausea, dizziness, severe HA, changes vision/speech, left arm numbness and tingling, and jaw pain.  Cholesterol: -Continue diet and exercise.  -Check cholesterol.   Pre-diabetes: -Continue diet and exercise.  -Check A1C  Vitamin D Def: -check level -continue medications.   Obesity -discussed weight gain and diet and exercise  HAD LABS in Nov Has OV in May Continue diet and meds as discussed. Further disposition pending results of labs. Future Appointments Date Time Provider [place]  02/03/2017 9:00 AM Vicie Mutters, PA-C [id number] None  02/21/2017 11:30 AM Pieter Partridge, DO [facility] None    HPI 50 y.o. male  presents for 3 month follow up with hypertension, hyperlipidemia, prediabetes and vitamin D.   He has cyst on AB, did pop it but has not gone away, has been there 6-7 months, no changes.   His blood pressure has been controlled at home, today their BP is BP: 138/74.   He does workout, started back at gym. He denies chest pain, shortness of breath, dizziness.    He is on cholesterol medication and denies myalgias. His cholesterol is at goal. The cholesterol last visit was:   Lab Results  Component Value Date   CHOL 159 08/17/2016   HDL 41 08/17/2016   LDLCALC 93 08/17/2016   TRIG 127 08/17/2016   CHOLHDL 3.9 08/17/2016    He has been working on diet and exercise for prediabetes, and denies foot ulcerations, hyperglycemia, hypoglycemia , increased appetite, nausea, paresthesia of the feet, polydipsia, polyuria, visual disturbances, vomiting and weight loss. Last A1C in the office was:  Lab Results  Component Value Date   HGBA1C 5.5 08/17/2016   Patient is on Vitamin D supplement.  Lab Results  Component Value Date   VD25OH 64 08/17/2016    Following Dr. Tomi Likens for trigeminal neuralgia.  BMI is Body mass index is 34.17 kg/m., he is working on diet and exercise. Wt Readings from Last 3 Encounters:  09/06/16 259 lb (117.5 kg)  08/23/16 253 lb (114.8 kg)  05/31/16 257 lb 12.8 oz (116.9 kg)   He has a history of testosterone deficiency and is on testosterone replacement, last shot was Nov 29, due tomorrow. He states that the testosterone helps with his energy, libido, muscle mass. Lab Results  Component Value Date   TESTOSTERONE 627 08/17/2016     Current Medications:  Current Outpatient Prescriptions on File Prior to Visit  Medication Sig Dispense Refill  . aspirin 81 MG chewable tablet Chew by mouth daily.    Marland Kitchen diltiazem (CARDIZEM) 120 MG tablet TAKE ONE TABLET BY MOUTH ONCE DAILY 90 tablet 1  . fluticasone (FLONASE) 50 MCG/ACT nasal spray USE ONE SPRAY EACH NOSTRIL TWICE DAILY 16 g 3  . lisinopril (PRINIVIL,ZESTRIL) 40 MG tablet Take 1 tablet (40 mg total) by mouth daily. 90 tablet 1  . Magnesium 250 MG TABS Take 250 mg by mouth daily.    . Methylcobalamin (B-12) 5000 MCG TBDP Take by mouth.    . Omega-3 Fatty Acids (FISH OIL) 1000 MG CAPS Take by mouth 4 (four) times daily.    Marland Kitchen oxcarbazepine (TRILEPTAL) 600 MG tablet TAKE ONE TABLET BY MOUTH TWICE DAILY 60 tablet 5  . Red Yeast Rice Extract (RED YEAST  RICE PO) Take 1,200 mg by mouth 2 (two) times daily.     Marland Kitchen testosterone cypionate (DEPOTESTOSTERONE CYPIONATE) 200 MG/ML injection INJECT 2MLS INTO THE MUSCLE EVERY 2 WEEKS 10 mL 2  . valACYclovir (VALTREX) 1000 MG tablet TAKE ONE TABLET BY MOUTH ONCE DAILY 90 tablet 1  . VITAMIN D, ERGOCALCIFEROL, PO Take 5,000 Int'l Units/day by mouth.    . [DISCONTINUED] isometheptene-acetaminophen-dichloralphenazone (MIDRIN) 65-325-100 MG capsule Take 1 capsule by mouth 4 (four) times daily as needed for migraine. Maximum 5 capsules in 12 hours for migraine headaches, 8 capsules in 24 hours for tension headaches.      No current facility-administered medications on file prior to visit.     Medical History:  Past Medical History:  Diagnosis Date  . Elevated hemoglobin A1c   . Elevated LDL cholesterol level   . Hx of colonic polyp 11/20/2014  . Hypertension   . Hypogonadism male   . Vitamin D deficiency     Allergies:  Allergies  Allergen Reactions  . Toprol Xl [Metoprolol Tartrate]     ED     Review of Systems:  Review of Systems  Constitutional: Negative for chills, fever and malaise/fatigue.  HENT: Negative for congestion, ear pain and sore throat.   Eyes: Negative.   Respiratory: Negative for cough, shortness of breath and wheezing.   Cardiovascular: Negative for chest pain, palpitations and leg swelling.  Gastrointestinal: Negative for blood in stool, constipation, diarrhea, heartburn and melena.  Genitourinary: Negative.   Skin: Negative.   Neurological: Negative for dizziness, sensory change, loss of consciousness and headaches.  Psychiatric/Behavioral: Negative for depression. The patient is not nervous/anxious and does not have insomnia.     Family history- Review and unchanged  Social history- Review and unchanged  Physical Exam: BP 138/74   Pulse 73   Temp 97.7 F (36.5 C)   Resp 16   Ht 6\' 1"  (1.854 m)   Wt 259 lb (117.5 kg)   SpO2 97%   BMI 34.17 kg/m  Wt Readings from Last 3 Encounters:  09/06/16 259 lb (117.5 kg)  08/23/16 253 lb (114.8 kg)  05/31/16 257 lb 12.8 oz (116.9 kg)   General Appearance: Well nourished well developed, in no apparent distress. Eyes: PERRLA, EOMs, conjunctiva no swelling or erythema ENT/Mouth: Ear canals normal without obstruction, swelling, erythma, discharge.  TMs normal bilaterally.  Oropharynx moist, clear, without exudate, or postoropharyngeal swelling. Neck: Supple, thyroid normal,no cervical adenopathy  Respiratory: Respiratory effort normal, Breath sounds clear A&P without rhonchi, wheeze, or rale.  No retractions, no  accessory usage. Cardio: RRR with no MRGs. Brisk peripheral pulses without edema.  Abdomen: Soft, + BS,  Non tender, no guarding, rebound, hernias, masses. Musculoskeletal: Full ROM, 5/5 strength, Normal gait Skin: Warm, dry without rashes, lesions, ecchymosis.  Neuro: Awake and oriented X 3, Cranial nerves intact. Normal muscle tone, no cerebellar symptoms. Psych: Normal affect, Insight and Judgment appropriate.    Vicie Mutters, PA-C 4:45 PM [facility]

## 2016-09-06 NOTE — Patient Instructions (Signed)
Epidermal Cyst An epidermal cyst is sometimes called a sebaceous cyst, epidermal inclusion cyst, or infundibular cyst. These cysts usually contain a substance that looks "pasty" or "cheesy" and may have a bad smell. This substance is a protein called keratin. Epidermal cysts are usually found on the face, neck, or trunk. They may also occur in the vaginal area or other parts of the genitalia of both men and women. Epidermal cysts are usually small, painless, slow-growing bumps or lumps that move freely under the skin. It is important not to try to pop them. This may cause an infection and lead to tenderness and swelling. CAUSES  Epidermal cysts may be caused by a deep penetrating injury to the skin or a plugged hair follicle, often associated with acne. SYMPTOMS  Epidermal cysts can become inflamed and cause:  Redness.  Tenderness.  Increased temperature of the skin over the bumps or lumps.  Grayish-white, bad smelling material that drains from the bump or lump. DIAGNOSIS  Epidermal cysts are easily diagnosed by your caregiver during an exam. Rarely, a tissue sample (biopsy) may be taken to rule out other conditions that may resemble epidermal cysts. TREATMENT   Epidermal cysts often get better and disappear on their own. They are rarely ever cancerous.  If a cyst becomes infected, it may become inflamed and tender. This may require opening and draining the cyst. Treatment with antibiotics may be necessary. When the infection is gone, the cyst may be removed with minor surgery.  Small, inflamed cysts can often be treated with antibiotics or by injecting steroid medicines.  Sometimes, epidermal cysts become large and bothersome. If this happens, surgical removal in your caregiver's office may be necessary. HOME CARE INSTRUCTIONS  Only take over-the-counter or prescription medicines as directed by your caregiver.  Take your antibiotics as directed. Finish them even if you start to feel  better. SEEK MEDICAL CARE IF:   Your cyst becomes tender, red, or swollen.  Your condition is not improving or is getting worse.  You have any other questions or concerns. MAKE SURE YOU:  Understand these instructions.  Will watch your condition.  Will get help right away if you are not doing well or get worse. This information is not intended to replace advice given to you by your health care provider. Make sure you discuss any questions you have with your health care provider. Document Released: 08/07/2004 Document Revised: 11/29/2011 Document Reviewed: 07/09/2015 Elsevier Interactive Patient Education  2017 [employer].

## 2016-09-21 ENCOUNTER — Encounter: Payer: Self-pay | Admitting: Physician Assistant

## 2016-09-21 MED ORDER — TADALAFIL 20 MG PO TABS: 20.0000 mg | ORAL_TABLET | Freq: Every day | ORAL | 0 refills | Status: DC | PRN

## 2016-10-22 ENCOUNTER — Other Ambulatory Visit: Payer: Self-pay | Admitting: Neurology

## 2017-01-06 ENCOUNTER — Other Ambulatory Visit: Payer: Self-pay | Admitting: Internal Medicine

## 2017-01-14 ENCOUNTER — Other Ambulatory Visit: Payer: Self-pay | Admitting: Physician Assistant

## 2017-02-01 NOTE — Progress Notes (Signed)
Complete Physical  Assessment and Plan: Essential hypertension - continue medications, DASH diet, exercise and monitor at home. Call if greater than 130/80.  - CBC with Differential/Platelet - BASIC METABOLIC PANEL WITH GFR - Hepatic function panel - TSH - Urinalysis, Routine w reflex microscopic (not at [facility]) - Microalbumin / creatinine urine ratio - EKG 12-Lead  Hyperlipidemia -continue medications, check lipids, decrease fatty foods, increase activity.  - Lipid panel  CKD (chronic kidney disease) stage 2, GFR 60-89 ml/min Increase fluids, avoid NSAIDS, monitor sugars, will monitor - BASIC METABOLIC PANEL WITH GFR - Uric acid  OSA on CPAP Sleep apnea- continue CPAP, weight loss advised.   Trigeminal neuralgia of right side of face Continue medications  PreDiabetes - Hemoglobin A1c  Vitamin D deficiency - VITAMIN D 25 Hydroxy (Vit-D Deficiency, Fractures)   Medication management - Magnesium  Testosterone Deficiency - Testosterone   Hx of colonic polyp + FHx CRCA Gets regular follow up, due next year  Morbid obesity, unspecified obesity type ([place]) Obesity with co morbidities- long discussion about weight loss, diet, and exercise   Gastroesophageal reflux disease, esophagitis presence not specified Prilosec/zantac, follow up GI if not better, no RUQ pain/no nausea.    Encounter for general adult medical examination with abnormal findings   Screening PSA (prostate specific antigen) - PSA  Seborrheic keratoses 3 freeze and thaw technique, information given  Screening, anemia, deficiency, iron -     Iron and TIBC -     Ferritin -     Vitamin B12   Discussed med's effects and SE's. Screening labs and tests as requested with regular follow-up as recommended. Over 40 minutes of exam, counseling, chart review and critical decision making was performed  HPI Patient presents for a complete physical.   His blood pressure has been controlled at home, today  their BP is BP: 130/80 He does workout, walks with his wife 2-3 days a week, about 2 miles.  He denies chest pain, shortness of breath, dizziness.  Spot on left cheek x several weeks, getting more prevalent.  He has history of trigeminal neuralgia and is on medications for this, tolerating well, following with Dr. Tomi Likens.   He is not on cholesterol medication and denies myalgias. His cholesterol is at goal. The cholesterol last visit was:   Lab Results  Component Value Date   CHOL 159 08/17/2016   HDL 41 08/17/2016   LDLCALC 93 08/17/2016   TRIG 127 08/17/2016   CHOLHDL 3.9 08/17/2016   He has been working on diet and exercise for prediabetes, and denies paresthesia of the feet, polydipsia, polyuria and visual disturbances. Last A1C in the office was:  Lab Results  Component Value Date   HGBA1C 5.5 08/17/2016  Last GFR: Lab Results  Component Value Date   GFRNONAA 68 08/17/2016  Patient is on Vitamin D supplement.   Lab Results  Component Value Date   VD25OH 64 08/17/2016    Last PSA was: Lab Results  Component Value Date   PSA 0.8 08/17/2016   BMI is Body mass index is 33.94 kg/m., he is working on diet and exercise. Wt Readings from Last 3 Encounters:  02/03/17 260 lb 12.8 oz (118.3 kg)  09/06/16 259 lb (117.5 kg)  08/23/16 253 lb (114.8 kg)    Current Medications:  Current Outpatient Prescriptions on File Prior to Visit  Medication Sig Dispense Refill  . aspirin 81 MG chewable tablet Chew by mouth daily.    Marland Kitchen diltiazem (CARDIZEM) 120 MG tablet  TAKE ONE TABLET BY MOUTH ONCE DAILY. 90 tablet 1  . fluticasone (FLONASE) 50 MCG/ACT nasal spray USE ONE SPRAY EACH NOSTRIL TWICE DAILY 16 g 3  . lisinopril (PRINIVIL,ZESTRIL) 40 MG tablet TAKE ONE TABLET BY MOUTH ONCE DAILY 90 tablet 0  . Magnesium 250 MG TABS Take 250 mg by mouth daily.    . Methylcobalamin (B-12) 5000 MCG TBDP Take by mouth.    . Omega-3 Fatty Acids (FISH OIL) 1000 MG CAPS Take by mouth 4 (four) times daily.     Marland Kitchen oxcarbazepine (TRILEPTAL) 600 MG tablet TAKE ONE TABLET BY MOUTH TWICE DAILY 60 tablet 5  . Red Yeast Rice Extract (RED YEAST RICE PO) Take 1,200 mg by mouth 2 (two) times daily.     Marland Kitchen testosterone cypionate (DEPOTESTOSTERONE CYPIONATE) 200 MG/ML injection INJECT 2MLS INTO THE MUSCLE EVERY 2 WEEKS 10 mL 2  . valACYclovir (VALTREX) 1000 MG tablet Take 1 tablet (1,000 mg total) by mouth daily. 90 tablet 1  . VITAMIN D, ERGOCALCIFEROL, PO Take 5,000 Int'l Units/day by mouth.    . [DISCONTINUED] isometheptene-acetaminophen-dichloralphenazone (MIDRIN) 65-325-100 MG capsule Take 1 capsule by mouth 4 (four) times daily as needed for migraine. Maximum 5 capsules in 12 hours for migraine headaches, 8 capsules in 24 hours for tension headaches.     No current facility-administered medications on file prior to visit.    Health Maintenance:  Immunization History  Administered Date(s) Administered  . Influenza Split 07/10/2014  . Influenza-Unspecified 07/19/2016  . PPD Test 01/07/2014  . Pneumococcal-Unspecified 09/20/2009  . Td 09/20/2005  . Tdap 02/03/2016   Tetanus: 2017 Pneumovax: 2011 Prevnar 13: due at 65 Flu vaccine: 2017 at work  Zostavax: N/A  Colonoscopy: 10/2014, repeat in 2019 due to family hx/pathology EGD: N/A [place] AB 09/2014 Eye Exam: Dr. Delman Cheadle Dentist: Dr. Zenaida Niece at [facility]  Patient Care Team: [name], MD as PCP - General (Internal Medicine) [name], MD as PCP - Internal Medicine (Internal Medicine) Gatha Mayer, MD as Consulting Physician (Gastroenterology) Pieter Partridge, DO as Consulting Physician (Neurology) Melrose Nakayama, MD as Consulting Physician (Orthopedic Surgery)  Medical History:  Past Medical History:  Diagnosis Date  . Elevated hemoglobin A1c   . Elevated LDL cholesterol level   . Hx of colonic polyp 11/20/2014  . Hypertension   . Hypogonadism male   . Vitamin D deficiency    Allergies Allergies  Allergen Reactions  .  Toprol Xl [Metoprolol Tartrate]     ED    SURGICAL HISTORY He  has a past surgical history that includes Hip fracture surgery (Right, 1986); Patella fracture surgery (Right, 1997); and c5-6 fusion. FAMILY HISTORY His family history includes Colon cancer in his maternal grandfather, maternal uncle, paternal grandfather, and paternal uncle. SOCIAL HISTORY He  reports that he quit smoking about 27 years ago. He has never used smokeless tobacco. He reports that he does not drink alcohol or use drugs.   Review of Systems:  Review of Systems  Constitutional: Negative.   HENT: Negative.   Eyes: Negative.   Respiratory: Negative.   Cardiovascular: Negative.   Gastrointestinal: Negative for abdominal pain, blood in stool, constipation, diarrhea, heartburn, melena, nausea and vomiting.  Genitourinary: Negative.   Musculoskeletal: Positive for joint pain. Negative for back pain, falls, myalgias and neck pain.  Skin: Negative.   Neurological: Negative.   Endo/Heme/Allergies: Negative.   Psychiatric/Behavioral: Negative.     Physical Exam: Estimated body mass index is 33.94 kg/m as calculated from the following:  Height as of this encounter: 6' 1.5" (1.867 m).   Weight as of this encounter: 260 lb 12.8 oz (118.3 kg). BP 130/80   Pulse 77   Temp 97.5 F (36.4 C)   Resp 16   Ht 6' 1.5" (1.867 m)   Wt 260 lb 12.8 oz (118.3 kg)   SpO2 99%   BMI 33.94 kg/m  General Appearance: Well nourished, in no apparent distress.  Eyes: PERRLA, EOMs, conjunctiva no swelling or erythema, normal fundi and vessels.  Sinuses: No Frontal/maxillary tenderness  ENT/Mouth: Ext aud canals clear, normal light reflex with TMs without erythema, bulging. Good dentition. No erythema, swelling, or exudate on post pharynx. Tonsils not swollen or erythematous. Hearing normal.  Neck: Supple, thyroid normal. No bruits  Respiratory: Respiratory effort normal, BS equal bilaterally without rales, rhonchi, wheezing or  stridor.  Cardio: RRR without murmurs, rubs or gallops. Brisk peripheral pulses without edema.  Chest: symmetric, with normal excursions and percussion.  Abdomen: Soft, nontender, no guarding, rebound, hernias, masses, or organomegaly.  Lymphatics: Non tender without lymphadenopathy.  Genitourinary: defer [place] per patient Musculoskeletal: Full ROM all peripheral extremities,5/5 strength, and normal gait.  Strength is normal and symmetric in arms. Skin: right face with brown macule/stuck on appearance. Warm, dry without rashes, lesions, ecchymosis. Neuro: Cranial nerves intact, reflexes equal bilaterally. Normal muscle tone, no cerebellar symptoms. Sensation intact.  Psych: Awake and oriented X 3, normal affect, Insight and Judgment appropriate.   EKG: WNL no changes. AORTA SCAN: defer  Vicie Mutters 9:04 AM [facility]

## 2017-02-03 ENCOUNTER — Ambulatory Visit (INDEPENDENT_AMBULATORY_CARE_PROVIDER_SITE_OTHER): Payer: BC Managed Care – PPO | Admitting: Physician Assistant

## 2017-02-03 ENCOUNTER — Encounter: Payer: Self-pay | Admitting: Physician Assistant

## 2017-02-03 VITALS — BP 130/80 | HR 77 | Temp 97.5°F | Resp 16 | Ht 73.5 in | Wt 260.8 lb

## 2017-02-03 DIAGNOSIS — E559 Vitamin D deficiency, unspecified: Secondary | ICD-10-CM

## 2017-02-03 DIAGNOSIS — I10 Essential (primary) hypertension: Secondary | ICD-10-CM | POA: Diagnosis not present

## 2017-02-03 DIAGNOSIS — E291 Testicular hypofunction: Secondary | ICD-10-CM

## 2017-02-03 DIAGNOSIS — L821 Other seborrheic keratosis: Secondary | ICD-10-CM | POA: Diagnosis not present

## 2017-02-03 DIAGNOSIS — Z9989 Dependence on other enabling machines and devices: Secondary | ICD-10-CM

## 2017-02-03 DIAGNOSIS — Z130 Encounter for screening for diseases of the blood and blood-forming organs and certain disorders involving the immune mechanism: Secondary | ICD-10-CM

## 2017-02-03 DIAGNOSIS — R6889 Other general symptoms and signs: Secondary | ICD-10-CM

## 2017-02-03 DIAGNOSIS — G4733 Obstructive sleep apnea (adult) (pediatric): Secondary | ICD-10-CM

## 2017-02-03 DIAGNOSIS — Z125 Encounter for screening for malignant neoplasm of prostate: Secondary | ICD-10-CM

## 2017-02-03 DIAGNOSIS — G500 Trigeminal neuralgia: Secondary | ICD-10-CM

## 2017-02-03 DIAGNOSIS — E785 Hyperlipidemia, unspecified: Secondary | ICD-10-CM

## 2017-02-03 DIAGNOSIS — N182 Chronic kidney disease, stage 2 (mild): Secondary | ICD-10-CM

## 2017-02-03 DIAGNOSIS — Z136 Encounter for screening for cardiovascular disorders: Secondary | ICD-10-CM | POA: Diagnosis not present

## 2017-02-03 DIAGNOSIS — Z0001 Encounter for general adult medical examination with abnormal findings: Secondary | ICD-10-CM | POA: Diagnosis not present

## 2017-02-03 DIAGNOSIS — Z79899 Other long term (current) drug therapy: Secondary | ICD-10-CM

## 2017-02-03 DIAGNOSIS — Z86010 Personal history of colonic polyps: Secondary | ICD-10-CM

## 2017-02-03 DIAGNOSIS — R7309 Other abnormal glucose: Secondary | ICD-10-CM

## 2017-02-03 LAB — BASIC METABOLIC PANEL WITH GFR
BUN: 9 mg/dL (ref 7–25)
CHLORIDE: 97 mmol/L — AB (ref 98–110)
CO2: 22 mmol/L (ref 20–31)
CREATININE: 1.17 mg/dL (ref 0.70–1.33)
Calcium: 10 mg/dL (ref 8.6–10.3)
GFR, Est African American: 82 mL/min (ref >=60)
GFR, Est Non African American: 71 mL/min (ref >=60)
Glucose, Bld: 89 mg/dL (ref 65–99)
Potassium: 4.5 mmol/L (ref 3.5–5.3)
SODIUM: 134 mmol/L — AB (ref 135–146)

## 2017-02-03 LAB — IRON AND TIBC
%SAT: 35 % (ref 15–60)
Iron: 121 ug/dL (ref 50–180)
TIBC: 345 ug/dL (ref 250–425)
UIBC: 224 ug/dL

## 2017-02-03 LAB — HEPATIC FUNCTION PANEL
ALBUMIN: 4.7 g/dL (ref 3.6–5.1)
ALK PHOS: 52 U/L (ref 40–115)
ALT: 31 U/L (ref 9–46)
AST: 32 U/L (ref 10–35)
BILIRUBIN DIRECT: 0.1 mg/dL (ref <=0.2)
BILIRUBIN INDIRECT: 0.4 mg/dL (ref 0.2–1.2)
Total Bilirubin: 0.5 mg/dL (ref 0.2–1.2)
Total Protein: 7.5 g/dL (ref 6.1–8.1)

## 2017-02-03 LAB — CBC WITH DIFFERENTIAL/PLATELET
BASOS ABS: 0 {cells}/uL (ref 0–200)
Basophils Relative: 0 %
EOS PCT: 3 %
Eosinophils Absolute: 159 cells/uL (ref 15–500)
HCT: 49.7 % (ref 38.5–50.0)
HEMOGLOBIN: 17 g/dL (ref 13.2–17.1)
Lymphocytes Relative: 27 %
Lymphs Abs: 1431 cells/uL (ref 850–3900)
MCH: 31.8 pg (ref 27.0–33.0)
MCHC: 34.2 g/dL (ref 32.0–36.0)
MCV: 92.9 fL (ref 80.0–100.0)
MPV: 8.8 fL (ref 7.5–12.5)
Monocytes Absolute: 795 cells/uL (ref 200–950)
Monocytes Relative: 15 %
NEUTROS PCT: 55 %
Neutro Abs: 2915 cells/uL (ref 1500–7800)
Platelets: 244 10*3/uL (ref 140–400)
RBC: 5.35 MIL/uL (ref 4.20–5.80)
RDW: 14.4 % (ref 11.0–15.0)
WBC: 5.3 10*3/uL (ref 3.8–10.8)

## 2017-02-03 LAB — LIPID PANEL
Cholesterol: 155 mg/dL (ref <200)
HDL: 41 mg/dL (ref >40)
LDL CALC: 96 mg/dL (ref <100)
Total CHOL/HDL Ratio: 3.8 Ratio (ref <5.0)
Triglycerides: 92 mg/dL (ref <150)
VLDL: 18 mg/dL (ref <30)

## 2017-02-03 LAB — TSH: TSH: 1.65 m[IU]/L (ref 0.40–4.50)

## 2017-02-03 NOTE — Patient Instructions (Addendum)
Seborrheic Keratosis Seborrheic keratosis is a common, noncancerous (benign) skin growth. This condition causes waxy, rough, tan, brown, or black spots to appear on the skin. These skin growths can be flat or raised. What are the causes? The cause of this condition is not known. What increases the risk? This condition is more likely to develop in:  People who have a family history of seborrheic keratosis.  People who are 51 or older.  People who are pregnant.  People who have had estrogen replacement therapy. What are the signs or symptoms? This condition often occurs on the face, chest, shoulders, back, or other areas. These growths:  Are usually painless, but may become irritated and itchy.  Can be yellow, brown, black, or other colors.  Are slightly raised or have a flat surface.  Are sometimes rough or wart-like in texture.  Are often waxy on the surface.  Are round or oval-shaped.  Sometimes look like they are "stuck on."  Often occur in groups, but may occur as a single growth. How is this diagnosed? This condition is diagnosed with a medical history and physical exam. A sample of the growth may be tested (skin biopsy). You may need to see a skin specialist (dermatologist). How is this treated? Treatment is not usually needed for this condition, unless the growths are irritated or are often bleeding. You may also choose to have the growths removed if you do not like their appearance. Most commonly, these growths are treated with a procedure in which liquid nitrogen is applied to "freeze" off the growth (cryosurgery). They may also be burned off with electricity or cut off. Follow these instructions at home:  Watch your growth for any changes.  Keep all follow-up visits as told by your health care provider. This is important.  Do not scratch or pick at the growth or growths. This can cause them to become irritated or infected. Contact a health care provider if:  You  suddenly have many new growths.  Your growth bleeds, itches, or hurts.  Your growth suddenly becomes larger or changes color. This information is not intended to replace advice given to you by your health care provider. Make sure you discuss any questions you have with your health care provider. Document Released: 10/09/2010 Document Revised: 02/12/2016 Document Reviewed: 01/22/2015 Elsevier Interactive Patient Education  2017 [employer].    Simple math prevails.    1st - exercise does not produce significant weight loss - at best one converts fat into muscle , "bulks up", loses inches, but usually stays "weight neutral"     2nd - think of your body weightas a check book: If you eat more calories than you burn up - you save money or gain weight .... Or if you spend more money than you put in the check book, ie burn up more calories than you eat, then you lose weight     3rd - if you walk or run 1 mile, you burn up 100 calories - you have to burn up 3,500 calories to lose 1 pound, ie you have to walk/run 35 miles to lose 1 measly pound. So if you want to lose 10 #, then you have to walk/run 350 miles, so.... clearly exercise is not the solution.     4. So if you consume 1,500 calories, then you have to burn up the equivalent of 15 miles to stay weight neutral - It also stands to reason that if you consume 1,500 cal/day and don't lose  weight, then you must be burning up about 1,500 cals/day to stay weight neutral.     5. If you really want to lose weight, you must cut your calorie intake 300 calories /day and at that rate you should lose about 1 # every 3 days.   6. Please purchase Dr Fara Olden Fuhrman's book(s) "The End of Dieting" & "Eat to Live" . It has some great concepts and recipes.

## 2017-02-04 LAB — URINALYSIS, ROUTINE W REFLEX MICROSCOPIC
Bilirubin Urine: NEGATIVE
GLUCOSE, UA: NEGATIVE
Hgb urine dipstick: NEGATIVE
Ketones, ur: NEGATIVE
Leukocytes, UA: NEGATIVE
Nitrite: NEGATIVE
PROTEIN: NEGATIVE
SPECIFIC GRAVITY, URINE: 1.011 (ref 1.001–1.035)
pH: 7.5 (ref 5.0–8.0)

## 2017-02-04 LAB — VITAMIN D 25 HYDROXY (VIT D DEFICIENCY, FRACTURES): VIT D 25 HYDROXY: 70 ng/mL (ref 30–100)

## 2017-02-04 LAB — MICROALBUMIN / CREATININE URINE RATIO
Creatinine, Urine: 72 mg/dL (ref 20–370)
Microalb Creat Ratio: 13 mcg/mg creat (ref <30)
Microalb, Ur: 0.9 mg/dL

## 2017-02-04 LAB — HEMOGLOBIN A1C
Hgb A1c MFr Bld: 5.7 % — ABNORMAL HIGH (ref <5.7)
Mean Plasma Glucose: 117 mg/dL

## 2017-02-04 LAB — MAGNESIUM: Magnesium: 2.1 mg/dL (ref 1.5–2.5)

## 2017-02-04 LAB — PSA: PSA: 0.8 ng/mL (ref <=4.0)

## 2017-02-04 LAB — FERRITIN: Ferritin: 35 ng/mL (ref 20–380)

## 2017-02-04 LAB — VITAMIN B12: VITAMIN B 12: 559 pg/mL (ref 200–1100)

## 2017-02-04 LAB — TESTOSTERONE: Testosterone: 903 ng/dL — ABNORMAL HIGH (ref 250–827)

## 2017-02-04 LAB — URIC ACID: URIC ACID, SERUM: 4.7 mg/dL (ref 4.0–8.0)

## 2017-02-21 ENCOUNTER — Ambulatory Visit: Payer: Self-pay | Admitting: Neurology

## 2017-02-22 ENCOUNTER — Ambulatory Visit (INDEPENDENT_AMBULATORY_CARE_PROVIDER_SITE_OTHER): Payer: BC Managed Care – PPO | Admitting: Neurology

## 2017-02-22 ENCOUNTER — Encounter: Payer: Self-pay | Admitting: Neurology

## 2017-02-22 VITALS — BP 132/74 | HR 74 | Ht 73.0 in | Wt 260.0 lb

## 2017-02-22 DIAGNOSIS — G500 Trigeminal neuralgia: Secondary | ICD-10-CM | POA: Diagnosis not present

## 2017-02-22 NOTE — Patient Instructions (Signed)
1.  We will continue the oxcarbazepine 600mg  twice daily and B12 1000 mcg daily. 2.  Follow up in 6 months.  If you are still doing well, we can try tapering off of the oxcarbazepine. 3.  Repeat BMP (sodium) in 6 months prior to follow up.

## 2017-02-22 NOTE — Progress Notes (Signed)
NEUROLOGY FOLLOW UP OFFICE NOTE  Donald Schwartz [id number]  HISTORY OF PRESENT ILLNESS: Donald Schwartz is a 51 year old right-handed man with hypertension, CKD stage 2, hyperlipidemia and OSA who follows up for right sided trigeminal neuralgia.   UPDATE: He is taking oxcarbazepine 600mg  twice daily.  He is also taking liquid B12 1025mcg daily. He has not had any recurrent facial pain in the past 6 months: Duration: none Frequency:  No episodes in past 6 months Triggers and relieving factors:  Not applicable.  He is tolerating the oxcarbazepine.  He denies headache, dizziness, confusion and gait instability or falls. 02/03/17 LABS: Na 134, B12 559.   HISTORY: Beginning in January 2016, he started having pain involving the right side of the face.  It started in the right eye, then involving the right top of head, right side of nose and right upper teeth.  It is a shooting pain.  It is triggered by lightly touching his cheek, eating or the feeling of water running over his head.  Sometimes there is a slight tingling but usually not.  There is no numbness or facial weakness.  He was evaluated by the eye doctor and dentist, with normal exams.     Prior medications:  Lyrica.  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  PAST MEDICAL HISTORY: Past Medical History:  Diagnosis Date  . Elevated hemoglobin A1c   . Elevated LDL cholesterol level   . Hx of colonic polyp 11/20/2014  . Hypertension   . Hypogonadism male   . Vitamin D deficiency     MEDICATIONS: Current Outpatient Prescriptions on File Prior to Visit  Medication Sig Dispense Refill  . aspirin 81 MG chewable tablet Chew by mouth daily.    Marland Kitchen diltiazem (CARDIZEM) 120 MG tablet TAKE ONE TABLET BY MOUTH ONCE DAILY. 90 tablet 1  . fluticasone (FLONASE) 50 MCG/ACT nasal spray USE ONE SPRAY EACH NOSTRIL TWICE DAILY 16 g 3  . lisinopril (PRINIVIL,ZESTRIL) 40 MG tablet TAKE ONE TABLET BY MOUTH  ONCE DAILY 90 tablet 0  . Magnesium 250 MG TABS Take 250 mg by mouth daily.    . Methylcobalamin (B-12) 5000 MCG TBDP Take by mouth.    . Omega-3 Fatty Acids (FISH OIL) 1000 MG CAPS Take by mouth 4 (four) times daily.    Marland Kitchen oxcarbazepine (TRILEPTAL) 600 MG tablet TAKE ONE TABLET BY MOUTH TWICE DAILY 60 tablet 5  . Red Yeast Rice Extract (RED YEAST RICE PO) Take 1,200 mg by mouth 2 (two) times daily.     Marland Kitchen testosterone cypionate (DEPOTESTOSTERONE CYPIONATE) 200 MG/ML injection INJECT 2MLS INTO THE MUSCLE EVERY 2 WEEKS 10 mL 2  . valACYclovir (VALTREX) 1000 MG tablet Take 1 tablet (1,000 mg total) by mouth daily. 90 tablet 1  . VITAMIN D, ERGOCALCIFEROL, PO Take 5,000 Int'l Units/day by mouth.    . [DISCONTINUED] isometheptene-acetaminophen-dichloralphenazone (MIDRIN) 65-325-100 MG capsule Take 1 capsule by mouth 4 (four) times daily as needed for migraine. Maximum 5 capsules in 12 hours for migraine headaches, 8 capsules in 24 hours for tension headaches.     No current facility-administered medications on file prior to visit.     ALLERGIES: Allergies  Allergen Reactions  . Toprol Xl [Metoprolol Tartrate]     ED    FAMILY HISTORY: Family History  Problem Relation Age of Onset  . Colon cancer Maternal Uncle   . Colon cancer Paternal Uncle   . Colon cancer Maternal Grandfather   .  Colon cancer Paternal Grandfather     SOCIAL HISTORY: Social History   Social History  . Marital status: Married    Spouse name: N/A  . Number of children: N/A  . Years of education: N/A   Occupational History  . Not on file.   Social History Main Topics  . Smoking status: Former Smoker    Quit date: 09/20/1989  . Smokeless tobacco: Never Used  . Alcohol use No  . Drug use: No  . Sexual activity: Yes    Partners: Female   Other Topics Concern  . Not on file   Social History Narrative  . No narrative on file    REVIEW OF SYSTEMS: Constitutional: No fevers, chills, or sweats, no  generalized fatigue, change in appetite Eyes: No visual changes, double vision, eye pain Ear, nose and throat: No hearing loss, ear pain, nasal congestion, sore throat Cardiovascular: No chest pain, palpitations Respiratory:  No shortness of breath at rest or with exertion, wheezes GastrointestinaI: No nausea, vomiting, diarrhea, abdominal pain, fecal incontinence Genitourinary:  No dysuria, urinary retention or frequency Musculoskeletal:  No neck pain, back pain Integumentary: No rash, pruritus, skin lesions Neurological: as above Psychiatric: No depression, insomnia, anxiety Endocrine: No palpitations, fatigue, diaphoresis, mood swings, change in appetite, change in weight, increased thirst Hematologic/Lymphatic:  No purpura, petechiae. Allergic/Immunologic: no itchy/runny eyes, nasal congestion, recent allergic reactions, rashes  PHYSICAL EXAM: Vitals:   02/22/17 0726  BP: 132/74  Pulse: 74   General: No acute distress.  Patient appears well-groomed.   Head:  Normocephalic/atraumatic Eyes:  Fundi examined but not visualized Neck: supple, no paraspinal tenderness, full range of motion Heart:  Regular rate and rhythm Lungs:  Clear to auscultation bilaterally Back: No paraspinal tenderness Neurological Exam: alert and oriented to person, place, and time. Attention span and concentration intact, recent and remote memory intact, fund of knowledge intact.  Speech fluent and not dysarthric, language intact.  CN II-XII intact. Bulk and tone normal, muscle strength 5/5 throughout.  Sensation to light touch, temperature and vibration intact.  Deep tendon reflexes 2+ throughout, toes downgoing.  Finger to nose and heel to shin testing intact.  Gait normal, Romberg negative.  IMPRESSION: Right-sided trigeminal neuralgia, controlled  PLAN: 1.  Continue oxcarbazepine 600mg  twice daily and B12 1000 mcg daily 2.  Repeat BMP in 6 months with follow up soon afterwards.  If still pain-free, we  will try tapering off of the oxcarbazepine.  Metta Clines, DO  CC:  [name], MD  Vicie Mutters, PA-C

## 2017-03-24 ENCOUNTER — Other Ambulatory Visit: Payer: Self-pay | Admitting: Physician Assistant

## 2017-04-21 ENCOUNTER — Other Ambulatory Visit: Payer: Self-pay | Admitting: Neurology

## 2017-04-21 ENCOUNTER — Other Ambulatory Visit: Payer: Self-pay | Admitting: Internal Medicine

## 2017-04-22 ENCOUNTER — Other Ambulatory Visit: Payer: Self-pay | Admitting: Internal Medicine

## 2017-04-22 NOTE — Telephone Encounter (Signed)
Please call Depo - Testosterone 

## 2017-06-01 NOTE — Progress Notes (Signed)
Patient ID: Donald Schwartz, male   DOB: 1966-01-12, 51 y.o.   MRN: [id number]  Assessment and Plan:  Hypertension:  - continue medications -monitor blood pressure at home.  -Continue DASH diet.   -Reminder to go to the ER if any CP, SOB, nausea, dizziness, severe HA, changes vision/speech, left arm numbness and tingling, and jaw pain.  Cholesterol: -Continue diet and exercise.  -Check cholesterol.   Pre-diabetes: -Continue diet and exercise.  -Check A1C  Vitamin D Def: -check level -continue medications.   Obesity -discussed weight gain and diet and exercise  Continue diet and meds as discussed. Further disposition pending results of labs. Future Appointments Date Time Provider [place]  08/31/2017 7:30 AM Pieter Partridge, DO [id number] None  02/06/2018 9:00 AM Vicie Mutters, PA-C [id number] None    HPI 51 y.o. male  presents for 3 month follow up with hypertension, hyperlipidemia, prediabetes and vitamin D.    His blood pressure has been controlled at home, today their BP is BP: 136/80.   He does workout, started back at gym. He denies chest pain, shortness of breath, dizziness.   BP Readings from Last 3 Encounters:  06/02/17 136/80  02/22/17 132/74  02/03/17 130/80    He is on cholesterol medication and denies myalgias. His cholesterol is at goal. The cholesterol last visit was:   Lab Results  Component Value Date   CHOL 155 02/03/2017   HDL 41 02/03/2017   LDLCALC 96 02/03/2017   TRIG 92 02/03/2017   CHOLHDL 3.8 02/03/2017    He has been working on diet and exercise for prediabetes, and denies foot ulcerations, hyperglycemia, hypoglycemia , increased appetite, nausea, paresthesia of the feet, polydipsia, polyuria, visual disturbances, vomiting and weight loss. Last A1C in the office was:  Lab Results  Component Value Date   HGBA1C 5.7 (H) 02/03/2017   Patient is on Vitamin D supplement.  Lab Results  Component Value Date   VD25OH 70 02/03/2017     Following Dr. Tomi Likens for trigeminal neuralgia.  BMI is Body mass index is 34.78 kg/m., he is working on diet and exercise. Wt Readings from Last 3 Encounters:  06/02/17 263 lb 9.6 oz (119.6 kg)  02/22/17 260 lb (117.9 kg)  02/03/17 260 lb 12.8 oz (118.3 kg)   He has a history of testosterone deficiency and is on testosterone replacement, he has not been on it for 1 month.  He states that the testosterone helps with his energy, libido, muscle mass. Lab Results  Component Value Date   TESTOSTERONE 903 (H) 02/03/2017    Current Medications:  Current Outpatient Prescriptions on File Prior to Visit  Medication Sig Dispense Refill  . aspirin 81 MG chewable tablet Chew by mouth daily.    Marland Kitchen diltiazem (CARDIZEM) 120 MG tablet TAKE ONE TABLET BY MOUTH ONCE DAILY. 90 tablet 1  . fluticasone (FLONASE) 50 MCG/ACT nasal spray USE ONE SPRAY EACH NOSTRIL TWICE DAILY 16 g 3  . lisinopril (PRINIVIL,ZESTRIL) 40 MG tablet TAKE 1 TABLET BY MOUTH ONCE DAILY 90 tablet 0  . Magnesium 250 MG TABS Take 250 mg by mouth daily.    . Methylcobalamin (B-12) 5000 MCG TBDP Take by mouth.    . Omega-3 Fatty Acids (FISH OIL) 1000 MG CAPS Take by mouth 4 (four) times daily.    Marland Kitchen oxcarbazepine (TRILEPTAL) 600 MG tablet TAKE ONE TABLET BY MOUTH TWICE DAILY 60 tablet 5  . Red Yeast Rice Extract (RED YEAST RICE PO) Take 1,200 mg by mouth  2 (two) times daily.     Marland Kitchen testosterone cypionate (DEPOTESTOSTERONE CYPIONATE) 200 MG/ML injection INJECT 2MLS INTO THE MUSCLE EVERY 2 WEEKS 10 mL 3  . valACYclovir (VALTREX) 1000 MG tablet TAKE ONE TABLET BY MOUTH ONCE DAILY 90 tablet 1  . VITAMIN D, ERGOCALCIFEROL, PO Take 5,000 Int'l Units/day by mouth.    . [DISCONTINUED] isometheptene-acetaminophen-dichloralphenazone (MIDRIN) 65-325-100 MG capsule Take 1 capsule by mouth 4 (four) times daily as needed for migraine. Maximum 5 capsules in 12 hours for migraine headaches, 8 capsules in 24 hours for tension headaches.     No current  facility-administered medications on file prior to visit.     Medical History:  Past Medical History:  Diagnosis Date  . Elevated hemoglobin A1c   . Elevated LDL cholesterol level   . Hx of colonic polyp 11/20/2014  . Hypertension   . Hypogonadism male   . Vitamin D deficiency     Allergies:  Allergies  Allergen Reactions  . Toprol Xl [Metoprolol Tartrate]     ED     Review of Systems:  Review of Systems  Constitutional: Negative for chills, fever and malaise/fatigue.  HENT: Negative for congestion, ear pain and sore throat.   Eyes: Negative.   Respiratory: Negative for cough, shortness of breath and wheezing.   Cardiovascular: Negative for chest pain, palpitations and leg swelling.  Gastrointestinal: Negative for blood in stool, constipation, diarrhea, heartburn and melena.  Genitourinary: Negative.   Skin: Negative.   Neurological: Negative for dizziness, sensory change, loss of consciousness and headaches.  Psychiatric/Behavioral: Negative for depression. The patient is not nervous/anxious and does not have insomnia.     Family history- Review and unchanged  Social history- Review and unchanged  Physical Exam: BP 136/80   Pulse 80   Temp 97.6 F (36.4 C)   Resp 18   Ht 6\' 1"  (1.854 m)   Wt 263 lb 9.6 oz (119.6 kg)   SpO2 97%   BMI 34.78 kg/m  Wt Readings from Last 3 Encounters:  06/02/17 263 lb 9.6 oz (119.6 kg)  02/22/17 260 lb (117.9 kg)  02/03/17 260 lb 12.8 oz (118.3 kg)   General Appearance: Well nourished well developed, in no apparent distress. Eyes: PERRLA, EOMs, conjunctiva no swelling or erythema ENT/Mouth: Ear canals normal without obstruction, swelling, erythma, discharge.  TMs normal bilaterally.  Oropharynx moist, clear, without exudate, or postoropharyngeal swelling. Neck: Supple, thyroid normal,no cervical adenopathy  Respiratory: Respiratory effort normal, Breath sounds clear A&P without rhonchi, wheeze, or rale.  No retractions, no  accessory usage. Cardio: RRR with no MRGs. Brisk peripheral pulses without edema.  Abdomen: Soft, + BS,  Non tender, no guarding, rebound, hernias, masses. Musculoskeletal: Full ROM, 5/5 strength, Normal gait Skin: Warm, dry without rashes, lesions, ecchymosis.  Neuro: Awake and oriented X 3, Cranial nerves intact. Normal muscle tone, no cerebellar symptoms. Psych: Normal affect, Insight and Judgment appropriate.    Vicie Mutters, PA-C 4:33 PM [facility]

## 2017-06-02 ENCOUNTER — Ambulatory Visit (INDEPENDENT_AMBULATORY_CARE_PROVIDER_SITE_OTHER): Payer: BC Managed Care – PPO | Admitting: Physician Assistant

## 2017-06-02 ENCOUNTER — Encounter: Payer: Self-pay | Admitting: Physician Assistant

## 2017-06-02 VITALS — BP 136/80 | HR 80 | Temp 97.6°F | Resp 18 | Ht 73.0 in | Wt 263.6 lb

## 2017-06-02 DIAGNOSIS — Z79899 Other long term (current) drug therapy: Secondary | ICD-10-CM

## 2017-06-02 DIAGNOSIS — Z23 Encounter for immunization: Secondary | ICD-10-CM | POA: Diagnosis not present

## 2017-06-02 DIAGNOSIS — Z9989 Dependence on other enabling machines and devices: Secondary | ICD-10-CM

## 2017-06-02 DIAGNOSIS — E291 Testicular hypofunction: Secondary | ICD-10-CM

## 2017-06-02 DIAGNOSIS — R7303 Prediabetes: Secondary | ICD-10-CM | POA: Diagnosis not present

## 2017-06-02 DIAGNOSIS — G4733 Obstructive sleep apnea (adult) (pediatric): Secondary | ICD-10-CM

## 2017-06-02 DIAGNOSIS — E785 Hyperlipidemia, unspecified: Secondary | ICD-10-CM | POA: Diagnosis not present

## 2017-06-02 DIAGNOSIS — I10 Essential (primary) hypertension: Secondary | ICD-10-CM

## 2017-06-02 MED ORDER — SILDENAFIL CITRATE 100 MG PO TABS: ORAL_TABLET | ORAL | 2 refills | Status: AC

## 2017-06-02 NOTE — Patient Instructions (Addendum)
On [employer] there is a wrist BP cuff, omron BP cuff that is good  Monitor your blood pressure at home. Go to the ER if any CP, SOB, nausea, dizziness, severe HA, changes vision/speech  Goal BP:  For patients younger than 60: Goal BP < 140/90. For patients 60 and older: Goal BP < 150/90. For patients with diabetes: Goal BP < 140/90. Your most recent BP: BP: 136/80   Take your medications faithfully as instructed. Maintain a healthy weight. Get at least 150 minutes of aerobic exercise per week. Minimize salt intake. Minimize alcohol intake  DASH Eating Plan DASH stands for "Dietary Approaches to Stop Hypertension." The DASH eating plan is a healthy eating plan that has been shown to reduce high blood pressure (hypertension). Additional health benefits may include reducing the risk of type 2 diabetes mellitus, heart disease, and stroke. The DASH eating plan may also help with weight loss. WHAT DO I NEED TO KNOW ABOUT THE DASH EATING PLAN? For the DASH eating plan, you will follow these general guidelines:  Choose foods with a percent daily value for sodium of less than 5% (as listed on the food label).  Use salt-free seasonings or herbs instead of table salt or sea salt.  Check with your health care provider or pharmacist before using salt substitutes.  Eat lower-sodium products, often labeled as "lower sodium" or "no salt added."  Eat fresh foods.  Eat more vegetables, fruits, and low-fat dairy products.  Choose whole grains. Look for the word "whole" as the first word in the ingredient list.  Choose fish and skinless chicken or [place] more often than red meat. Limit fish, poultry, and meat to 6 oz (170 g) each day.  Limit sweets, desserts, sugars, and sugary drinks.  Choose heart-healthy fats.  Limit cheese to 1 oz (28 g) per day.  Eat more home-cooked food and less restaurant, buffet, and fast food.  Limit fried foods.  Cook foods using methods other than frying.  Limit  canned vegetables. If you do use them, rinse them well to decrease the sodium.  When eating at a restaurant, ask that your food be prepared with less salt, or no salt if possible. WHAT FOODS CAN I EAT? Seek help from a dietitian for individual calorie needs. Grains Whole grain or whole wheat bread. Brown rice. Whole grain or whole wheat pasta. Quinoa, bulgur, and whole grain cereals. Low-sodium cereals. Corn or whole wheat flour tortillas. Whole grain cornbread. Whole grain crackers. Low-sodium crackers. Vegetables Fresh or frozen vegetables (raw, steamed, roasted, or grilled). Low-sodium or reduced-sodium tomato and vegetable juices. Low-sodium or reduced-sodium tomato sauce and paste. Low-sodium or reduced-sodium canned vegetables.  Fruits All fresh, canned (in natural juice), or frozen fruits. Meat and Other Protein Products Ground beef (85% or leaner), grass-fed beef, or beef trimmed of fat. Skinless chicken or [place]. Ground chicken or [place]. Pork trimmed of fat. All fish and seafood. Eggs. Dried beans, peas, or lentils. Unsalted nuts and seeds. Unsalted canned beans. Dairy Low-fat dairy products, such as skim or 1% milk, 2% or reduced-fat cheeses, low-fat ricotta or cottage cheese, or plain low-fat yogurt. Low-sodium or reduced-sodium cheeses. Fats and Oils Tub margarines without trans fats. Light or reduced-fat mayonnaise and salad dressings (reduced sodium). Avocado. Safflower, olive, or canola oils. Natural peanut or almond butter. Other Unsalted popcorn and pretzels. The items listed above may not be a complete list of recommended foods or beverages. Contact your dietitian for more options. WHAT FOODS ARE NOT RECOMMENDED? Grains  White bread. White pasta. White rice. Refined cornbread. Bagels and croissants. Crackers that contain trans fat. Vegetables Creamed or fried vegetables. Vegetables in a cheese sauce. Regular canned vegetables. Regular canned tomato sauce and paste. Regular  tomato and vegetable juices. Fruits Dried fruits. Canned fruit in light or heavy syrup. Fruit juice. Meat and Other Protein Products Fatty cuts of meat. Ribs, chicken wings, bacon, sausage, bologna, salami, chitterlings, fatback, hot dogs, bratwurst, and packaged luncheon meats. Salted nuts and seeds. Canned beans with salt. Dairy Whole or 2% milk, cream, half-and-half, and cream cheese. Whole-fat or sweetened yogurt. Full-fat cheeses or blue cheese. Nondairy creamers and whipped toppings. Processed cheese, cheese spreads, or cheese curds. Condiments Onion and garlic salt, seasoned salt, table salt, and sea salt. Canned and packaged gravies. Worcestershire sauce. Tartar sauce. Barbecue sauce. Teriyaki sauce. Soy sauce, including reduced sodium. Steak sauce. Fish sauce. Oyster sauce. Cocktail sauce. Horseradish. Ketchup and mustard. Meat flavorings and tenderizers. Bouillon cubes. Hot sauce. Tabasco sauce. Marinades. Taco seasonings. Relishes. Fats and Oils Butter, stick margarine, lard, shortening, ghee, and bacon fat. Coconut, palm kernel, or palm oils. Regular salad dressings. Other Pickles and olives. Salted popcorn and pretzels. The items listed above may not be a complete list of foods and beverages to avoid. Contact your dietitian for more information. WHERE CAN I FIND MORE INFORMATION? National Heart, Lung, and Blood Institute: travelstabloid.com Document Released: 08/26/2011 Document Revised: 01/21/2014 Document Reviewed: 07/11/2013 [facility] Patient Information 2015 [place], [place]. This information is not intended to replace advice given to you by your health care provider. Make sure you discuss any questions you have with your health care provider.   9 Ways to Naturally Increase Testosterone Levels  1.   Lose Weight If you're overweight, shedding the excess pounds may increase your testosterone levels, according to research presented at the Endocrine  Society's 2012 meeting. Overweight men are more likely to have low testosterone levels to begin with, so this is an important trick to increase your body's testosterone production when you need it most.  2.   High-Intensity Exercise like Peak Fitness  Short intense exercise has a proven positive effect on increasing testosterone levels and preventing its decline. That's unlike aerobics or prolonged moderate exercise, which have shown to have negative or no effect on testosterone levels. Having a whey protein meal after exercise can further enhance the satiety/testosterone-boosting impact (hunger hormones cause the opposite effect on your testosterone and libido). Here's a summary of what a typical high-intensity Peak Fitness routine might look like: " Warm up for three minutes  " Exercise as hard and fast as you can for 30 seconds. You should feel like you couldn't possibly go on another few seconds  " Recover at a slow to moderate pace for 90 seconds  " Repeat the high intensity exercise and recovery 7 more times .  3.   Consume Plenty of Zinc The mineral zinc is important for testosterone production, and supplementing your diet for as little as six weeks has been shown to cause a marked improvement in testosterone among men with low levels.1 Likewise, research has shown that restricting dietary sources of zinc leads to a significant decrease in testosterone, while zinc supplementation increases it2 -- and even protects men from exercised-induced reductions in testosterone levels.3 It's estimated that up to 73 percent of adults over the age of 60 may have lower than recommended zinc intakes; even when dietary supplements were added in, an estimated 20-25 percent of older adults still had inadequate zinc  intakes, according to a [employer] and Nutrition Examination Survey.4 Your diet is the best source of zinc; along with protein-rich foods like meats and fish, other good dietary sources of zinc  include raw milk, raw cheese, beans, and yogurt or kefir made from raw milk. It can be difficult to obtain enough dietary zinc if you're a vegetarian, and also for meat-eaters as well, largely because of conventional farming methods that rely heavily on chemical fertilizers and pesticides. These chemicals deplete the soil of nutrients ... nutrients like zinc that must be absorbed by plants in order to be passed on to you. In many cases, you may further deplete the nutrients in your food by the way you prepare it. For most food, cooking it will drastically reduce its levels of nutrients like zinc . particularly over-cooking, which many people do. If you decide to use a zinc supplement, stick to a dosage of less than 40 mg a day, as this is the recommended adult upper limit. Taking too much zinc can interfere with your body's ability to absorb other minerals, especially copper, and may cause nausea as a side effect.  4.   Strength Training In addition to Peak Fitness, strength training is also known to boost testosterone levels, provided you are doing so intensely enough. When strength training to boost testosterone, you'll want to increase the weight and lower your number of reps, and then focus on exercises that work a large number of muscles, such as dead lifts or squats.  You can "turbo-charge" your weight training by going slower. By slowing down your movement, you're actually turning it into a high-intensity exercise. Super Slow movement allows your muscle, at the microscopic level, to access the maximum number of cross-bridges between the protein filaments that produce movement in the muscle.   5.   Optimize Your Vitamin D Levels Vitamin D, a steroid hormone, is essential for the healthy development of the nucleus of the sperm cell, and helps maintain semen quality and sperm count. Vitamin D also increases levels of testosterone, which may boost libido. In one study, overweight men who were given  vitamin D supplements had a significant increase in testosterone levels after one year.5   6.   Reduce Stress When you're under a lot of stress, your body releases high levels of the stress hormone cortisol. This hormone actually blocks the effects of testosterone,6 presumably because, from a biological standpoint, testosterone-associated behaviors (mating, competing, aggression) may have lowered your chances of survival in an emergency (hence, the "fight or flight" response is dominant, courtesy of cortisol).  7.   Limit or Eliminate Sugar from Your Diet Testosterone levels decrease after you eat sugar, which is likely because the sugar leads to a high insulin level, another factor leading to low testosterone.7 Based on USDA estimates, the average American consumes 12 teaspoons of sugar a day, which equates to about TWO TONS of sugar during a lifetime.  8.   Eat Healthy Fats By healthy, this means not only mon- and polyunsaturated fats, like that found in avocadoes and nuts, but also saturated, as these are essential for building testosterone. Research shows that a diet with less than 40 percent of energy as fat (and that mainly from animal sources, i.e. saturated) lead to a decrease in testosterone levels.8 My personal diet is about 60-70 percent healthy fat, and other experts agree that the ideal diet includes somewhere between 50-70 percent fat.  It's important to understand that your body requires saturated fats from  animal and vegetable sources (such as meat, dairy, certain oils, and tropical plants like coconut) for optimal functioning, and if you neglect this important food group in favor of sugar, grains and other starchy carbs, your health and weight are almost guaranteed to suffer. Examples of healthy fats you can eat more of to give your testosterone levels a boost include: Olives and Olive oil  Coconuts and coconut oil Butter made from raw grass-fed organic milk Raw nuts, such as, almonds  or pecans Organic pastured egg yolks Avocados Grass-fed meats Palm oil Unheated organic nut oils   9.   Boost Your Intake of Branch Chain Amino Acids (BCAA) from Foods Like [employer] suggests that BCAAs result in higher testosterone levels, particularly when taken along with resistance training.9 While BCAAs are available in supplement form, you'll find the highest concentrations of BCAAs like leucine in dairy products - especially quality cheeses and whey protein. Even when getting leucine from your natural food supply, it's often wasted or used as a building block instead of an anabolic agent. So to create the correct anabolic environment, you need to boost leucine consumption way beyond mere maintenance levels. That said, keep in mind that using leucine as a free form amino acid can be highly counterproductive as when free form amino acids are artificially administrated, they rapidly enter your circulation while disrupting insulin function, and impairing your body's glycemic control. Food-based leucine is really the ideal form that can benefit your muscles without side effects.    If you are traveling you can take these medications to be more prepared. If you get chest pain, shortness of breath or abdominal pain please go to the hospital wherever you may be.   Ciprofloxacin is good for travelers diarrhea, you can take 2 pills a day for 7 days. Or it is also good for urinary tract infections, you can take 2 a day for 7 days.  Zpak- is good for sinus infections please finish as prescribed Phenergran is for nausea but it sedating so plan on eating and sleeping.  Levsin is good for nausea, diarrhea, or abdominal cramping- it can constipate you so don't take too much. This dissolves under your tongue.  Prednisone is good for joint pain or rashes or spider bites- you can take it as prescribed but if you are feeling better you can stop it early.  Diflucan is for a yeast infection.

## 2017-06-03 LAB — CBC WITH DIFFERENTIAL/PLATELET
Basophils Absolute: 58 cells/uL (ref 0–200)
Basophils Relative: 1 %
EOS PCT: 2.4 %
Eosinophils Absolute: 139 cells/uL (ref 15–500)
HEMATOCRIT: 48.5 % (ref 38.5–50.0)
Hemoglobin: 16.5 g/dL (ref 13.2–17.1)
LYMPHS ABS: 1520 {cells}/uL (ref 850–3900)
MCH: 30.8 pg (ref 27.0–33.0)
MCHC: 34 g/dL (ref 32.0–36.0)
MCV: 90.5 fL (ref 80.0–100.0)
MPV: 9.2 fL (ref 7.5–12.5)
Monocytes Relative: 14.8 %
NEUTROS ABS: 3225 {cells}/uL (ref 1500–7800)
Neutrophils Relative %: 55.6 %
Platelets: 288 10*3/uL (ref 140–400)
RBC: 5.36 10*6/uL (ref 4.20–5.80)
RDW: 13 % (ref 11.0–15.0)
Total Lymphocyte: 26.2 %
WBC mixed population: 858 cells/uL (ref 200–950)
WBC: 5.8 10*3/uL (ref 3.8–10.8)

## 2017-06-03 LAB — HEPATIC FUNCTION PANEL
AG Ratio: 1.8 (calc) (ref 1.0–2.5)
ALBUMIN MSPROF: 4.8 g/dL (ref 3.6–5.1)
ALT: 34 U/L (ref 9–46)
AST: 34 U/L (ref 10–35)
Alkaline phosphatase (APISO): 55 U/L (ref 40–115)
BILIRUBIN DIRECT: 0.1 mg/dL (ref 0.0–0.2)
BILIRUBIN INDIRECT: 0.4 mg/dL (ref 0.2–1.2)
BILIRUBIN TOTAL: 0.5 mg/dL (ref 0.2–1.2)
GLOBULIN: 2.7 g/dL (ref 1.9–3.7)
Total Protein: 7.5 g/dL (ref 6.1–8.1)

## 2017-06-03 LAB — LIPID PANEL
CHOL/HDL RATIO: 3.9 (calc) (ref <5.0)
Cholesterol: 167 mg/dL (ref <200)
HDL: 43 mg/dL (ref >40)
LDL CHOLESTEROL (CALC): 102 mg/dL — AB
NON-HDL CHOLESTEROL (CALC): 124 mg/dL (ref <130)
TRIGLYCERIDES: 119 mg/dL (ref <150)

## 2017-06-03 LAB — TSH: TSH: 1.72 mIU/L (ref 0.40–4.50)

## 2017-06-03 LAB — BASIC METABOLIC PANEL WITH GFR
BUN: 12 mg/dL (ref 7–25)
CO2: 22 mmol/L (ref 20–32)
Calcium: 9.3 mg/dL (ref 8.6–10.3)
Chloride: 98 mmol/L (ref 98–110)
Creat: 1.15 mg/dL (ref 0.70–1.33)
GFR, EST NON AFRICAN AMERICAN: 72 mL/min/{1.73_m2} (ref >=60)
GFR, Est African American: 84 mL/min/{1.73_m2} (ref >=60)
Glucose, Bld: 91 mg/dL (ref 65–99)
POTASSIUM: 4.1 mmol/L (ref 3.5–5.3)
Sodium: 131 mmol/L — ABNORMAL LOW (ref 135–146)

## 2017-06-03 LAB — HEMOGLOBIN A1C
HEMOGLOBIN A1C: 5.4 %{Hb} (ref <5.7)
MEAN PLASMA GLUCOSE: 108 (calc)
eAG (mmol/L): 6 (calc)

## 2017-06-03 LAB — MAGNESIUM: Magnesium: 2.3 mg/dL (ref 1.5–2.5)

## 2017-07-05 ENCOUNTER — Other Ambulatory Visit: Payer: Self-pay | Admitting: Internal Medicine

## 2017-07-05 ENCOUNTER — Encounter: Payer: Self-pay | Admitting: Physician Assistant

## 2017-07-05 ENCOUNTER — Other Ambulatory Visit: Payer: Self-pay | Admitting: Physician Assistant

## 2017-07-05 MED ORDER — SCOPOLAMINE 1 MG/3DAYS TD PT72: 1.0000 | MEDICATED_PATCH | TRANSDERMAL | 0 refills | Status: DC

## 2017-07-05 MED ORDER — AZITHROMYCIN 250 MG PO TABS: ORAL_TABLET | ORAL | 0 refills | Status: DC

## 2017-07-05 MED ORDER — MECLIZINE HCL 25 MG PO TABS: ORAL_TABLET | ORAL | 0 refills | Status: DC

## 2017-08-04 ENCOUNTER — Telehealth: Payer: Self-pay | Admitting: Neurology

## 2017-08-04 NOTE — Telephone Encounter (Signed)
Patient called and needs to see if he can have his medication Oxcarbazepine increased? Please Call. Thanks

## 2017-08-04 NOTE — Telephone Encounter (Signed)
Called and advsd Pt of increase instructions and to call our office od the pharmacy if needs refill prior to Dec appt. Pt has appt with PCP 2 days prior to our f/u appt, will have BMP drawn there and faxed to [place]

## 2017-08-04 NOTE — Telephone Encounter (Signed)
Increase oxcarbazepine from 600mg  twice daily to:   600mg  in AM and 900mg  at bedtime for 1 week, then  900mg  twice daily Repeat BMP a day or two prior to follow up in December.

## 2017-08-04 NOTE — Telephone Encounter (Signed)
Called and spoke with Pt. He states the R sided facial pain has increased, started in September. Pt wants to increase Oxcarbazepine. He wonders if it is related to the weather. F/u appt 08/31/17

## 2017-08-08 ENCOUNTER — Encounter: Payer: Self-pay | Admitting: Physician Assistant

## 2017-08-08 ENCOUNTER — Other Ambulatory Visit: Payer: Self-pay | Admitting: Internal Medicine

## 2017-08-22 ENCOUNTER — Telehealth: Payer: Self-pay

## 2017-08-22 NOTE — Telephone Encounter (Signed)
Pt is to have laps drawn at his PCP on 08/29/17, BMP will be included

## 2017-08-22 NOTE — Telephone Encounter (Signed)
-----   Message from Chester Holstein, LPN sent at 36/0/1658  9:46 AM EST -----   ----- Message ----- From: Oda Kilts, CMA Sent: 08/20/2017 To: Aileen Pilot Driver, LPN  6 month labs due  BMP

## 2017-08-26 ENCOUNTER — Ambulatory Visit: Payer: BC Managed Care – PPO

## 2017-08-26 ENCOUNTER — Other Ambulatory Visit: Payer: Self-pay | Admitting: Internal Medicine

## 2017-08-26 DIAGNOSIS — I10 Essential (primary) hypertension: Secondary | ICD-10-CM | POA: Diagnosis not present

## 2017-08-26 DIAGNOSIS — E559 Vitamin D deficiency, unspecified: Secondary | ICD-10-CM

## 2017-08-26 DIAGNOSIS — E782 Mixed hyperlipidemia: Secondary | ICD-10-CM | POA: Diagnosis not present

## 2017-08-26 DIAGNOSIS — Z79899 Other long term (current) drug therapy: Secondary | ICD-10-CM

## 2017-08-26 DIAGNOSIS — R7303 Prediabetes: Secondary | ICD-10-CM

## 2017-08-26 DIAGNOSIS — E785 Hyperlipidemia, unspecified: Secondary | ICD-10-CM

## 2017-08-26 DIAGNOSIS — R7309 Other abnormal glucose: Secondary | ICD-10-CM | POA: Diagnosis not present

## 2017-08-29 ENCOUNTER — Ambulatory Visit: Payer: Self-pay | Admitting: Physician Assistant

## 2017-08-30 ENCOUNTER — Ambulatory Visit: Payer: Self-pay | Admitting: Physician Assistant

## 2017-08-30 LAB — HEPATIC FUNCTION PANEL
AG Ratio: 1.4 (calc) (ref 1.0–2.5)
ALBUMIN MSPROF: 4.2 g/dL (ref 3.6–5.1)
ALT: 35 U/L (ref 9–46)
AST: 35 U/L (ref 10–35)
Alkaline phosphatase (APISO): 57 U/L (ref 40–115)
Bilirubin, Direct: 0.1 mg/dL (ref 0.0–0.2)
GLOBULIN: 3 g/dL (ref 1.9–3.7)
Indirect Bilirubin: 0.3 mg/dL (calc) (ref 0.2–1.2)
Total Bilirubin: 0.4 mg/dL (ref 0.2–1.2)
Total Protein: 7.2 g/dL (ref 6.1–8.1)

## 2017-08-30 LAB — CBC WITH DIFFERENTIAL/PLATELET
BASOS PCT: 0.9 %
Basophils Absolute: 53 cells/uL (ref 0–200)
EOS PCT: 3.4 %
Eosinophils Absolute: 201 cells/uL (ref 15–500)
HEMATOCRIT: 44.7 % (ref 38.5–50.0)
HEMOGLOBIN: 15.5 g/dL (ref 13.2–17.1)
LYMPHS ABS: 1440 {cells}/uL (ref 850–3900)
MCH: 30.8 pg (ref 27.0–33.0)
MCHC: 34.7 g/dL (ref 32.0–36.0)
MCV: 88.7 fL (ref 80.0–100.0)
MPV: 8.6 fL (ref 7.5–12.5)
Monocytes Relative: 17 %
NEUTROS ABS: 3204 {cells}/uL (ref 1500–7800)
NEUTROS PCT: 54.3 %
Platelets: 297 10*3/uL (ref 140–400)
RBC: 5.04 10*6/uL (ref 4.20–5.80)
RDW: 13.3 % (ref 11.0–15.0)
Total Lymphocyte: 24.4 %
WBC mixed population: 1003 cells/uL — ABNORMAL HIGH (ref 200–950)
WBC: 5.9 10*3/uL (ref 3.8–10.8)

## 2017-08-30 LAB — LIPID PANEL
Cholesterol: 165 mg/dL (ref <200)
HDL: 40 mg/dL — AB (ref >40)
LDL Cholesterol (Calc): 98 mg/dL (calc)
NON-HDL CHOLESTEROL (CALC): 125 mg/dL (ref <130)
Total CHOL/HDL Ratio: 4.1 (calc) (ref <5.0)
Triglycerides: 161 mg/dL — ABNORMAL HIGH (ref <150)

## 2017-08-30 LAB — HEMOGLOBIN A1C
Hgb A1c MFr Bld: 5.9 % of total Hgb — ABNORMAL HIGH (ref <5.7)
MEAN PLASMA GLUCOSE: 123 (calc)
eAG (mmol/L): 6.8 (calc)

## 2017-08-30 LAB — BASIC METABOLIC PANEL WITH GFR
BUN: 9 mg/dL (ref 7–25)
CALCIUM: 9.6 mg/dL (ref 8.6–10.3)
CHLORIDE: 93 mmol/L — AB (ref 98–110)
CO2: 27 mmol/L (ref 20–32)
CREATININE: 1.07 mg/dL (ref 0.70–1.33)
GFR, Est African American: 91 mL/min/{1.73_m2} (ref >=60)
GFR, Est Non African American: 79 mL/min/{1.73_m2} (ref >=60)
GLUCOSE: 95 mg/dL (ref 65–99)
Potassium: 4.2 mmol/L (ref 3.5–5.3)
Sodium: 128 mmol/L — ABNORMAL LOW (ref 135–146)

## 2017-08-30 LAB — TSH: TSH: 1.55 m[IU]/L (ref 0.40–4.50)

## 2017-08-30 LAB — INSULIN, RANDOM: INSULIN: 72.6 u[IU]/mL — AB (ref 2.0–19.6)

## 2017-08-30 LAB — MAGNESIUM: MAGNESIUM: 1.9 mg/dL (ref 1.5–2.5)

## 2017-08-30 LAB — VITAMIN D 25 HYDROXY (VIT D DEFICIENCY, FRACTURES): VIT D 25 HYDROXY: 55 ng/mL (ref 30–100)

## 2017-08-31 ENCOUNTER — Ambulatory Visit: Payer: Self-pay | Admitting: Neurology

## 2017-09-01 ENCOUNTER — Ambulatory Visit: Payer: BC Managed Care – PPO | Admitting: Neurology

## 2017-09-01 ENCOUNTER — Encounter: Payer: Self-pay | Admitting: Neurology

## 2017-09-01 VITALS — BP 120/78 | HR 72 | Ht 73.0 in | Wt 277.0 lb

## 2017-09-01 DIAGNOSIS — E871 Hypo-osmolality and hyponatremia: Secondary | ICD-10-CM | POA: Diagnosis not present

## 2017-09-01 DIAGNOSIS — G500 Trigeminal neuralgia: Secondary | ICD-10-CM | POA: Diagnosis not present

## 2017-09-01 MED ORDER — OXCARBAZEPINE 600 MG PO TABS: 600.0000 mg | ORAL_TABLET | Freq: Two times a day (BID) | ORAL | 5 refills | Status: DC

## 2017-09-01 MED ORDER — BACLOFEN 5 MG PO TABS: 5.0000 mg | ORAL_TABLET | Freq: Three times a day (TID) | ORAL | 2 refills | Status: DC

## 2017-09-01 NOTE — Patient Instructions (Signed)
1.  Due to low sodium, decrease oxcarbazepine back to 600mg  twice daily (1 tablet twice daily). 2.  We will add baclofen 5mg  three times daily.  We can increase dose if needed. 3.  Recheck BMP (sodium level) in 3 months prior to follow up 4.  Follow up in 3 months.

## 2017-09-01 NOTE — Progress Notes (Signed)
NEUROLOGY FOLLOW UP OFFICE NOTE  Donald Schwartz [id number]  HISTORY OF PRESENT ILLNESS: Donald Schwartz is a 51 year old right-handed man with hypertension, CKD stage 2, hyperlipidemia and OSA who follows up for right sided trigeminal neuralgia.   UPDATE: In September, he started noticing mildly increased discomfort.  In November, the had significant increased pain.  Due to increased pain, oxcarbazepine was titrated to 900mg  twice daily.  They are improved but not completely resolved.  They are now moderate to severe intensity.  They may occur daily, lasting a couple of seconds.  Triggers include brushing teeth or any abrupt shaking movement such as driving over a bump.  It resolves spontaneously.   He is tolerating the oxcarbazepine.  He denies headache, dizziness, somnolence, confusion, gait instability or falls.  08/26/17 LABS:  CBC with WBC 5.9, HGB 15.5, HCT 44.7, PLT 297; BMP with Na 128, K 4.2, Cl 93, CO2 27, BUN 9, Cr 1.07; hepatic panel with t bili 0.4, ALP 57, AST 35 and ALT 35.   HISTORY: Beginning in January 2016, he started having pain involving the right side of the face.  It started in the right eye, then involving the right top of head, right side of nose and right upper teeth.  It is a shooting pain.  It is triggered by lightly touching his cheek, eating or the feeling of water running over his head.  Sometimes there is a slight tingling but usually not.  There is no numbness or facial weakness.  He was evaluated by the eye doctor and dentist, with normal exams.     Prior medications:  Lyrica.  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  PAST MEDICAL HISTORY: Past Medical History:  Diagnosis Date  . Elevated hemoglobin A1c   . Elevated LDL cholesterol level   . Hx of colonic polyp 11/20/2014  . Hypertension   . Hypogonadism male   . Vitamin D deficiency     MEDICATIONS: Current Outpatient Medications on File Prior to Visit    Medication Sig Dispense Refill  . aspirin 81 MG chewable tablet Chew by mouth daily.    Marland Kitchen diltiazem (CARDIZEM) 120 MG tablet TAKE 1 TABLET BY MOUTH ONCE DAILY 90 tablet 1  . fluticasone (FLONASE) 50 MCG/ACT nasal spray USE ONE SPRAY EACH NOSTRIL TWICE DAILY 16 g 3  . lisinopril (PRINIVIL,ZESTRIL) 40 MG tablet TAKE 1 TABLET BY MOUTH ONCE DAILY 90 tablet 0  . Magnesium 250 MG TABS Take 250 mg by mouth daily.    . meclizine (ANTIVERT) 25 MG tablet 1/2-1 pill up to 3 times daily for motion sickness/dizziness 30 tablet 0  . Methylcobalamin (B-12) 5000 MCG TBDP Take by mouth.    . Omega-3 Fatty Acids (FISH OIL) 1000 MG CAPS Take by mouth 4 (four) times daily.    . Red Yeast Rice Extract (RED YEAST RICE PO) Take 1,200 mg by mouth 2 (two) times daily.     Marland Kitchen scopolamine (TRANSDERM-SCOP, 1.5 MG,) 1 MG/3DAYS Place 1 patch (1.5 mg total) onto the skin every 3 (three) days. 4 patch 0  . sildenafil (VIAGRA) 100 MG tablet 1/2-1 pill as needed 1 hour before intercourse, can get with good RX from [employer] 10 tablet 2  . testosterone cypionate (DEPOTESTOSTERONE CYPIONATE) 200 MG/ML injection INJECT 2MLS INTO THE MUSCLE EVERY 2 WEEKS 10 mL 3  . valACYclovir (VALTREX) 1000 MG tablet TAKE ONE TABLET BY MOUTH ONCE DAILY 90 tablet 1  . VITAMIN  D, ERGOCALCIFEROL, PO Take 5,000 Int'l Units/day by mouth.    . [DISCONTINUED] isometheptene-acetaminophen-dichloralphenazone (MIDRIN) 65-325-100 MG capsule Take 1 capsule by mouth 4 (four) times daily as needed for migraine. Maximum 5 capsules in 12 hours for migraine headaches, 8 capsules in 24 hours for tension headaches.     No current facility-administered medications on file prior to visit.     ALLERGIES: Allergies  Allergen Reactions  . Toprol Xl [Metoprolol Tartrate]     ED    FAMILY HISTORY: Family History  Problem Relation Age of Onset  . Colon cancer Maternal Uncle   . Colon cancer Paternal Uncle   . Colon cancer Maternal Grandfather   . Colon  cancer Paternal Grandfather     SOCIAL HISTORY: Social History   Socioeconomic History  . Marital status: Married    Spouse name: Not on file  . Number of children: Not on file  . Years of education: Not on file  . Highest education level: Not on file  Social Needs  . Financial resource strain: Not on file  . Food insecurity - worry: Not on file  . Food insecurity - inability: Not on file  . Transportation needs - medical: Not on file  . Transportation needs - non-medical: Not on file  Occupational History  . Not on file  Tobacco Use  . Smoking status: Former Smoker    Last attempt to quit: 09/20/1989    Years since quitting: 27.9  . Smokeless tobacco: Never Used  Substance and Sexual Activity  . Alcohol use: No    Alcohol/week: 0.0 oz  . Drug use: No  . Sexual activity: Yes    Partners: Female  Other Topics Concern  . Not on file  Social History Narrative  . Not on file    REVIEW OF SYSTEMS: Constitutional: No fevers, chills, or sweats, no generalized fatigue, change in appetite Eyes: No visual changes, double vision, eye pain Ear, nose and throat: No hearing loss, ear pain, nasal congestion, sore throat Cardiovascular: No chest pain, palpitations Respiratory:  No shortness of breath at rest or with exertion, wheezes GastrointestinaI: No nausea, vomiting, diarrhea, abdominal pain, fecal incontinence Genitourinary:  No dysuria, urinary retention or frequency Musculoskeletal:  No neck pain, back pain Integumentary: No rash, pruritus, skin lesions Neurological: as above Psychiatric: No depression, insomnia, anxiety Endocrine: No palpitations, fatigue, diaphoresis, mood swings, change in appetite, change in weight, increased thirst Hematologic/Lymphatic:  No purpura, petechiae. Allergic/Immunologic: no itchy/runny eyes, nasal congestion, recent allergic reactions, rashes  PHYSICAL EXAM: Vitals:   09/01/17 0730  BP: 120/78  Pulse: 72  SpO2: 97%   General: No  acute distress.  Patient appears well-groomed.  Head:  Normocephalic/atraumatic Eyes:  Fundi examined but not visualized Neck: supple, no paraspinal tenderness, full range of motion Heart:  Regular rate and rhythm Lungs:  Clear to auscultation bilaterally Back: No paraspinal tenderness Neurological Exam: alert and oriented to person, place, and time. Attention span and concentration intact, recent and remote memory intact, fund of knowledge intact.  Speech fluent and not dysarthric, language intact.  CN II-XII intact. Bulk and tone normal, muscle strength 5/5 throughout.  Sensation to light touch  intact.  Deep tendon reflexes 2+ throughout.  Finger to nose testing intact.  Gait normal, Romberg negative.  IMPRESSION: 1.  Right sided trigeminal neuralgia.  Increased symptoms possibly triggered by colder temperatures. 2.  Hyponatremia, likely secondary to oxcarbazepine.  Na level has decreased.  Although he is asymptomatic, I would like to reduce  dose of oxcarbazepine as the Na level is below 130.  PLAN: 1.  We will decrease oxcarbazepine back to 600mg  twice daily. 2.  We will add baclofen 5mg  three times daily.  We can increase dose to 10mg  three times daily later if needed. 3.  Follow up in 3 months, rechecking Na level just prior to follow up.  If symptoms not improved, consider MRI of brain to evaluate for vascular compression of trigeminal nerve and possible referral for microvascular decompression.  Metta Clines, DO  CC: [name], MD

## 2017-09-01 NOTE — Addendum Note (Signed)
Addended by: Clois Comber on: 09/01/2017 08:52 AM   Modules accepted: Orders

## 2017-09-04 ENCOUNTER — Other Ambulatory Visit: Payer: Self-pay | Admitting: Internal Medicine

## 2017-09-08 ENCOUNTER — Ambulatory Visit: Payer: BC Managed Care – PPO | Admitting: Internal Medicine

## 2017-09-08 VITALS — BP 122/84 | HR 80 | Temp 97.6°F | Resp 18 | Ht 73.0 in | Wt 268.6 lb

## 2017-09-08 DIAGNOSIS — I10 Essential (primary) hypertension: Secondary | ICD-10-CM | POA: Diagnosis not present

## 2017-09-08 DIAGNOSIS — R7303 Prediabetes: Secondary | ICD-10-CM

## 2017-09-08 DIAGNOSIS — E782 Mixed hyperlipidemia: Secondary | ICD-10-CM

## 2017-09-08 DIAGNOSIS — E291 Testicular hypofunction: Secondary | ICD-10-CM | POA: Diagnosis not present

## 2017-09-08 DIAGNOSIS — Z79899 Other long term (current) drug therapy: Secondary | ICD-10-CM

## 2017-09-08 DIAGNOSIS — E559 Vitamin D deficiency, unspecified: Secondary | ICD-10-CM | POA: Diagnosis not present

## 2017-09-08 LAB — BASIC METABOLIC PANEL WITH GFR
BUN: 10 mg/dL (ref 7–25)
CHLORIDE: 94 mmol/L — AB (ref 98–110)
CO2: 25 mmol/L (ref 20–32)
CREATININE: 1.16 mg/dL (ref 0.70–1.33)
Calcium: 9.4 mg/dL (ref 8.6–10.3)
GFR, EST AFRICAN AMERICAN: 83 mL/min/{1.73_m2} (ref >=60)
GFR, Est Non African American: 71 mL/min/{1.73_m2} (ref >=60)
Glucose, Bld: 84 mg/dL (ref 65–99)
Potassium: 4.6 mmol/L (ref 3.5–5.3)
Sodium: 130 mmol/L — ABNORMAL LOW (ref 135–146)

## 2017-09-08 MED ORDER — PREDNISONE 20 MG PO TABS: ORAL_TABLET | ORAL | 0 refills | Status: DC

## 2017-09-08 NOTE — Patient Instructions (Signed)

## 2017-09-08 NOTE — Progress Notes (Signed)
This very nice 51 y.o. MEWM presents for 6 month follow up with Hypertension, Hyperlipidemia, Pre-Diabetes and Vitamin D Deficiency. Patient is also followed by Dr Tomi Likens for R Trigeminal Neuralgia since 2016 partially controlled w/Lyrica. Patient is being tapered down his Trileptial by adding Baclofen for concern of contributory Hyponatremia.      Patient is treated for HTN (2002)  & BP has been controlled at home. Today's BP is at goal - 122/84. Patient has had no complaints of any cardiac type chest pain, palpitations, dyspnea / orthopnea / PND, dizziness, claudication, or dependent edema.     Hyperlipidemia is controlled with diet & meds. Patient denies myalgias or other med SE's. Last Lipids were at goal albeit sl elevated Trig's: Lab Results  Component Value Date   CHOL 165 08/26/2017   HDL 40 (L) 08/26/2017   LDLCALC 96 02/03/2017   TRIG 161 (H) 08/26/2017   CHOLHDL 4.1 08/26/2017      Also, the patient has history of Morbid Obesity (BMI 35+) and PreDiabetes (A1c 6.3% / 2008) and has had no symptoms of reactive hypoglycemia, diabetic polys, paresthesias or visual blurring.  Last A1c was not at goal: Lab Results  Component Value Date   HGBA1C 5.9 (H) 08/26/2017      Further, the patient also has history of Vitamin D Deficiency and supplements vitamin D ("31" /2008) without any suspected side-effects. Last vitamin D was  Near goal (70-100): Lab Results  Component Value Date   VD25OH 55 08/26/2017   Current Outpatient Medications on File Prior to Visit  Medication Sig  . aspirin 81 MG chewable tablet Chew by mouth daily.  . Baclofen 5 MG TABS Take 5 mg by mouth 3 (three) times daily.  . Cholecalciferol (VITAMIN D PO) Take 5,000 Units by mouth daily.  Marland Kitchen diltiazem (CARDIZEM) 120 MG tablet TAKE 1 TABLET BY MOUTH ONCE DAILY  . fluticasone (FLONASE) 50 MCG/ACT nasal spray USE ONE SPRAY EACH NOSTRIL TWICE DAILY  . lisinopril (PRINIVIL,ZESTRIL) 40 MG tablet TAKE 1 TABLET BY MOUTH  ONCE DAILY  . Magnesium 250 MG TABS Take 250 mg by mouth daily.  . meclizine (ANTIVERT) 25 MG tablet 1/2-1 pill up to 3 times daily for motion sickness/dizziness  . Methylcobalamin (B-12) 5000 MCG TBDP Take by mouth.  . Omega-3 Fatty Acids (FISH OIL) 1000 MG CAPS Take by mouth 4 (four) times daily.  Marland Kitchen oxcarbazepine (TRILEPTAL) 600 MG tablet Take 1 tablet (600 mg total) by mouth 2 (two) times daily.  . Red Yeast Rice Extract (RED YEAST RICE PO) Take 1,200 mg by mouth 2 (two) times daily.   . sildenafil (VIAGRA) 100 MG tablet 1/2-1 pill as needed 1 hour before intercourse, can get with good RX from [employer]  . testosterone cypionate (DEPOTESTOSTERONE CYPIONATE) 200 MG/ML injection INJECT 2MLS INTO THE MUSCLE EVERY 2 WEEKS  . valACYclovir (VALTREX) 1000 MG tablet TAKE ONE TABLET BY MOUTH ONCE DAILY  . [DISCONTINUED] isometheptene-acetaminophen-dichloralphenazone (MIDRIN) 65-325-100 MG capsule Take 1 capsule by mouth 4 (four) times daily as needed for migraine. Maximum 5 capsules in 12 hours for migraine headaches, 8 capsules in 24 hours for tension headaches.   No current facility-administered medications on file prior to visit.    Allergies  Allergen Reactions  . Toprol Xl [Metoprolol Tartrate]     ED   PMHx:   Past Medical History:  Diagnosis Date  . Elevated hemoglobin A1c   . Elevated LDL cholesterol level   . Hx of  colonic polyp 11/20/2014  . Hypertension   . Hypogonadism male   . Vitamin D deficiency    Immunization History  Administered Date(s) Administered  . Influenza Inj Mdck Quad With Preservative 06/02/2017  . Influenza Split 07/10/2014  . Influenza-Unspecified 07/19/2016  . PPD Test 01/07/2014  . Pneumococcal-Unspecified 09/20/2009  . Td 09/20/2005  . Tdap 02/03/2016   Past Surgical History:  Procedure Laterality Date  . c5-6 fusion    . HIP FRACTURE SURGERY Right 1986   avulsion/chip fracture ?stress fracture  . PATELLA FRACTURE SURGERY Right 1997   FHx:     Reviewed / unchanged  SHx:    Reviewed / unchanged  Systems Review:  Constitutional: Denies fever, chills, wt changes, headaches, insomnia, fatigue, night sweats, change in appetite. Eyes: Denies redness, blurred vision, diplopia, discharge, itchy, watery eyes.  ENT: Denies discharge, congestion, post nasal drip, epistaxis, sore throat, earache, hearing loss, dental pain, tinnitus, vertigo, sinus pain, snoring.  CV: Denies chest pain, palpitations, irregular heartbeat, syncope, dyspnea, diaphoresis, orthopnea, PND, claudication or edema. Respiratory: denies cough, dyspnea, DOE, pleurisy, hoarseness, laryngitis, wheezing.  Gastrointestinal: Denies dysphagia, odynophagia, heartburn, reflux, water brash, abdominal pain or cramps, nausea, vomiting, bloating, diarrhea, constipation, hematemesis, melena, hematochezia  or hemorrhoids. Genitourinary: Denies dysuria, frequency, urgency, nocturia, hesitancy, discharge, hematuria or flank pain. Musculoskeletal: Denies arthralgias, myalgias, stiffness, jt. swelling, pain, limping or strain/sprain.  Skin: Denies pruritus, rash, hives, warts, acne, eczema or change in skin lesion(s). Neuro: No weakness, tremor, incoordination, spasms, paresthesia or pain. Psychiatric: Denies confusion, memory loss or sensory loss. Endo: Denies change in weight, skin or hair change.  Heme/Lymph: No excessive bleeding, bruising or enlarged lymph nodes.  Physical Exam  BP 122/84   Pulse 80   Temp 97.6 F (36.4 C)   Resp 18   Ht 6\' 1"  (1.540 m)   Wt 268 lb 9.6 oz (121.8 kg)   BMI 35.44 kg/m   Appears well nourished, well groomed  and in no distress.  Eyes: PERRLA, EOMs, conjunctiva no swelling or erythema. Sinuses: No frontal/maxillary tenderness ENT/Mouth: EAC's clear, TM's nl w/o erythema, bulging. Nares clear w/o erythema, swelling, exudates. Oropharynx clear without erythema or exudates. Oral hygiene is good. Tongue normal, non obstructing. Hearing intact.    Neck: Supple. Thyroid nl. Car 2+/2+ without bruits, nodes or JVD. Chest: Respirations nl with BS clear & equal w/o rales, rhonchi, wheezing or stridor.  Cor: Heart sounds normal w/ regular rate and rhythm without sig. murmurs, gallops, clicks or rubs. Peripheral pulses normal and equal  without edema.  Abdomen: Soft & bowel sounds normal. Non-tender w/o guarding, rebound, hernias, masses or organomegaly.  Lymphatics: Unremarkable.  Musculoskeletal: Full ROM all peripheral extremities, joint stability, 5/5 strength and normal gait.  Skin: Warm, dry without exposed rashes, lesions or ecchymosis apparent.  Neuro: Cranial nerves intact, reflexes equal bilaterally. Sensory-motor testing grossly intact. Tendon reflexes grossly intact.  Pysch: Alert & oriented x 3.  Insight and judgement nl & appropriate. No ideations.  Assessment and Plan:  1. Essential hypertension  - Continue medication, monitor blood pressure at home.  - Continue DASH diet. Reminder to go to the ER if any CP,  SOB, nausea, dizziness, severe HA, changes vision/speech.  - BASIC METABOLIC PANEL WITH GFR  2. Hyperlipidemia, mixed  - Continue diet/meds, exercise,& lifestyle modifications.  - Continue monitor periodic cholesterol/liver & renal functions   3. Prediabetes  - Continue diet, exercise, lifestyle modifications.  - Monitor appropriate labs.  4. Vitamin D deficiency  - Continue  supplementation.  5. Testosterone Deficiency  6. Medication management  - BASIC METABOLIC PANEL WITH GFR       Discussed  regular exercise, BP monitoring, weight control to achieve/maintain BMI less than 25 and discussed med and SE's. Reviewed recent labs with patient and repeated BMET to monitor Sodium. Over 30 minutes of exam, counseling, chart review was performed.

## 2017-09-12 ENCOUNTER — Encounter: Payer: Self-pay | Admitting: Internal Medicine

## 2017-10-28 ENCOUNTER — Other Ambulatory Visit: Payer: Self-pay | Admitting: Physician Assistant

## 2017-10-31 ENCOUNTER — Other Ambulatory Visit: Payer: Self-pay | Admitting: Internal Medicine

## 2017-11-01 ENCOUNTER — Encounter: Payer: Self-pay | Admitting: Internal Medicine

## 2017-11-02 ENCOUNTER — Other Ambulatory Visit: Payer: Self-pay | Admitting: Adult Health

## 2017-11-22 ENCOUNTER — Other Ambulatory Visit: Payer: BC Managed Care – PPO

## 2017-11-22 DIAGNOSIS — E871 Hypo-osmolality and hyponatremia: Secondary | ICD-10-CM

## 2017-11-23 LAB — BASIC METABOLIC PANEL
BUN: 15 mg/dL (ref 7–25)
CALCIUM: 9.4 mg/dL (ref 8.6–10.3)
CO2: 28 mmol/L (ref 20–32)
CREATININE: 1.28 mg/dL (ref 0.70–1.33)
Chloride: 96 mmol/L — ABNORMAL LOW (ref 98–110)
GLUCOSE: 86 mg/dL (ref 65–99)
Potassium: 4.1 mmol/L (ref 3.5–5.3)
Sodium: 133 mmol/L — ABNORMAL LOW (ref 135–146)

## 2017-11-30 ENCOUNTER — Ambulatory Visit (INDEPENDENT_AMBULATORY_CARE_PROVIDER_SITE_OTHER): Payer: BC Managed Care – PPO | Admitting: Neurology

## 2017-11-30 ENCOUNTER — Encounter: Payer: Self-pay | Admitting: Neurology

## 2017-11-30 VITALS — BP 110/60 | HR 73 | Resp 16 | Ht 73.0 in | Wt 271.0 lb

## 2017-11-30 DIAGNOSIS — G500 Trigeminal neuralgia: Secondary | ICD-10-CM | POA: Diagnosis not present

## 2017-11-30 NOTE — Patient Instructions (Signed)
1.  Continue oxcarbazepine 600mg  twice daily 2.  Repeat BMP in 6 months and follow up afterward

## 2017-11-30 NOTE — Progress Notes (Signed)
NEUROLOGY FOLLOW UP OFFICE NOTE  Donald Schwartz [id number]  HISTORY OF PRESENT ILLNESS: Donald Schwartz is a 52 year old right-handed man with hypertension, CKD stage 2, hyperlipidemia and OSA who follows up for right sided trigeminal neuralgia.   UPDATE: Due to hyponatremia, oxcarbazepine was decreased from 900mg  to 600mg  twice daily.  Repeat Na from last week was 133.  Baclofen 5 three times daily was added but he did not refill it after a month because it was not approved by his insurance and it cost $90.  It was not effective anyway.  At the end of January, the pain started to subside and he has no pain at all.  When he uses his electric toothbrush on his canine tooth or shaves, he notes some mild discomfort but nothing too bad.   HISTORY: Beginning in January 2016, he started having pain involving the right side of the face.  It started in the right eye, then involving the right top of head, right side of nose and right upper teeth.  It is a paroxysmal shooting pain lasting a few seconds at a time and occurring several times daily.  It is triggered by lightly touching his cheek, eating or the feeling of water running over his head.  Sometimes there is a slight tingling but usually not.  Nothing relieves it, it aborts spontaneously.  There is no numbness or facial weakness.  He was evaluated by the eye doctor and dentist, with normal exams.     Prior medications:  Lyrica.  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  PAST MEDICAL HISTORY: Past Medical History:  Diagnosis Date  . Elevated hemoglobin A1c   . Elevated LDL cholesterol level   . Hx of colonic polyp 11/20/2014  . Hypertension   . Hypogonadism male   . Vitamin D deficiency     MEDICATIONS: Current Outpatient Medications on File Prior to Visit  Medication Sig Dispense Refill  . aspirin 81 MG chewable tablet Chew by mouth daily.    . B-D 3CC LUER-LOK SYR 21GX1" 21G X 1" 3 ML MISC USE  AS DIRECTED 100 each 0  . Baclofen 5 MG TABS Take 5 mg by mouth 3 (three) times daily. 90 tablet 2  . Cholecalciferol (VITAMIN D PO) Take 5,000 Units by mouth daily.    Marland Kitchen diltiazem (CARDIZEM) 120 MG tablet TAKE 1 TABLET BY MOUTH ONCE DAILY 90 tablet 1  . fluticasone (FLONASE) 50 MCG/ACT nasal spray USE ONE SPRAY EACH NOSTRIL TWICE DAILY 16 g 3  . lisinopril (PRINIVIL,ZESTRIL) 40 MG tablet TAKE 1 TABLET BY MOUTH ONCE DAILY 90 tablet 0  . Magnesium 250 MG TABS Take 250 mg by mouth daily.    . meclizine (ANTIVERT) 25 MG tablet 1/2-1 pill up to 3 times daily for motion sickness/dizziness 30 tablet 0  . Methylcobalamin (B-12) 5000 MCG TBDP Take by mouth.    . Omega-3 Fatty Acids (FISH OIL) 1000 MG CAPS Take by mouth 4 (four) times daily.    Marland Kitchen oxcarbazepine (TRILEPTAL) 600 MG tablet Take 1 tablet (600 mg total) by mouth 2 (two) times daily. 60 tablet 5  . predniSONE (DELTASONE) 20 MG tablet 1 tab 3 x day for 3 days, then 1 tab 2 x day for 3 days, then 1 tab 1 x day for 5 days 20 tablet 0  . Red Yeast Rice Extract (RED YEAST RICE PO) Take 1,200 mg by mouth 2 (two) times daily.     Marland Kitchen  sildenafil (VIAGRA) 100 MG tablet 1/2-1 pill as needed 1 hour before intercourse, can get with good RX from [facility] 10 tablet 2  . testosterone cypionate (DEPOTESTOSTERONE CYPIONATE) 200 MG/ML injection INJECT 2MLS INTO THE MUSCLE EVERY 2 WEEKS 10 mL 3  . valACYclovir (VALTREX) 1000 MG tablet TAKE 1 TABLET BY MOUTH ONCE DAILY 90 tablet 1  . [DISCONTINUED] isometheptene-acetaminophen-dichloralphenazone (MIDRIN) 65-325-100 MG capsule Take 1 capsule by mouth 4 (four) times daily as needed for migraine. Maximum 5 capsules in 12 hours for migraine headaches, 8 capsules in 24 hours for tension headaches.     No current facility-administered medications on file prior to visit.     ALLERGIES: Allergies  Allergen Reactions  . Toprol Xl [Metoprolol Tartrate]     ED    FAMILY HISTORY: Family History  Problem Relation  Age of Onset  . Colon cancer Maternal Uncle   . Colon cancer Paternal Uncle   . Colon cancer Maternal Grandfather   . Colon cancer Paternal Grandfather     SOCIAL HISTORY: Social History   Socioeconomic History  . Marital status: Married    Spouse name: Not on file  . Number of children: Not on file  . Years of education: Not on file  . Highest education level: Not on file  Social Needs  . Financial resource strain: Not on file  . Food insecurity - worry: Not on file  . Food insecurity - inability: Not on file  . Transportation needs - medical: Not on file  . Transportation needs - non-medical: Not on file  Occupational History  . Not on file  Tobacco Use  . Smoking status: Former Smoker    Last attempt to quit: 09/20/1989    Years since quitting: 28.2  . Smokeless tobacco: Never Used  Substance and Sexual Activity  . Alcohol use: No    Alcohol/week: 0.0 oz  . Drug use: No  . Sexual activity: Yes    Partners: Female  Other Topics Concern  . Not on file  Social History Narrative  . Not on file    REVIEW OF SYSTEMS: Constitutional: No fevers, chills, or sweats, no generalized fatigue, change in appetite Eyes: No visual changes, double vision, eye pain Ear, nose and throat: No hearing loss, ear pain, nasal congestion, sore throat Cardiovascular: No chest pain, palpitations Respiratory:  No shortness of breath at rest or with exertion, wheezes GastrointestinaI: No nausea, vomiting, diarrhea, abdominal pain, fecal incontinence Genitourinary:  No dysuria, urinary retention or frequency Musculoskeletal:  No neck pain, back pain Integumentary: No rash, pruritus, skin lesions Neurological: as above Psychiatric: No depression, insomnia, anxiety Endocrine: No palpitations, fatigue, diaphoresis, mood swings, change in appetite, change in weight, increased thirst Hematologic/Lymphatic:  No purpura, petechiae. Allergic/Immunologic: no itchy/runny eyes, nasal congestion, recent  allergic reactions, rashes  PHYSICAL EXAM: Vitals:   11/30/17 0737  BP: 110/60  Pulse: 73  Resp: 16  SpO2: 96%   General: No acute distress.  Patient appears well-groomed.  Head:  Normocephalic/atraumatic Eyes:  Fundi examined but not visualized Neck: supple, no paraspinal tenderness, full range of motion Heart:  Regular rate and rhythm Lungs:  Clear to auscultation bilaterally Back: No paraspinal tenderness Neurological Exam: alert and oriented to person, place, and time. Attention span and concentration intact, recent and remote memory intact, fund of knowledge intact.  Speech fluent and not dysarthric, language intact.  CN II-XII intact. Bulk and tone normal, muscle strength 5/5 throughout.  Sensation to light touch  intact.  Deep  tendon reflexes 2+ throughout.  Finger to nose testing intact.  Gait normal.  IMPRESSION: Right sided trigeminal neuralgia.  Improvement may be due to warmer weather.  PLAN: 1.  Continue oxcarbazepine 600mg  twice daily for now.  If he should have increased pain, I would like to prescribe baclofen 10mg  three times daily and appeal for pre-authorization. 2.  Repeat BMP in 6 months.  Follow up soon afterward.  Metta Clines, DO  CC: Dr. Melford Aase

## 2017-12-09 ENCOUNTER — Encounter: Payer: Self-pay | Admitting: Internal Medicine

## 2017-12-14 ENCOUNTER — Encounter: Payer: Self-pay | Admitting: Adult Health

## 2017-12-14 ENCOUNTER — Ambulatory Visit: Payer: BC Managed Care – PPO | Admitting: Adult Health

## 2017-12-14 VITALS — BP 124/76 | HR 83 | Temp 97.0°F | Ht 73.0 in | Wt 268.0 lb

## 2017-12-14 DIAGNOSIS — E559 Vitamin D deficiency, unspecified: Secondary | ICD-10-CM

## 2017-12-14 DIAGNOSIS — E782 Mixed hyperlipidemia: Secondary | ICD-10-CM

## 2017-12-14 DIAGNOSIS — Z79899 Other long term (current) drug therapy: Secondary | ICD-10-CM | POA: Diagnosis not present

## 2017-12-14 DIAGNOSIS — I10 Essential (primary) hypertension: Secondary | ICD-10-CM | POA: Diagnosis not present

## 2017-12-14 DIAGNOSIS — N182 Chronic kidney disease, stage 2 (mild): Secondary | ICD-10-CM | POA: Diagnosis not present

## 2017-12-14 DIAGNOSIS — R7309 Other abnormal glucose: Secondary | ICD-10-CM | POA: Diagnosis not present

## 2017-12-14 DIAGNOSIS — E291 Testicular hypofunction: Secondary | ICD-10-CM

## 2017-12-14 DIAGNOSIS — R079 Chest pain, unspecified: Secondary | ICD-10-CM

## 2017-12-14 MED ORDER — "SYRINGE/NEEDLE (DISP) 21G X 1"" 3 ML MISC": 0 refills | Status: DC

## 2017-12-14 MED ORDER — TESTOSTERONE CYPIONATE 200 MG/ML IM SOLN: INTRAMUSCULAR | 3 refills | Status: DC

## 2017-12-14 NOTE — Patient Instructions (Addendum)
Recommend making a list of the restaurants you go to, and google healthy options (or diabetic friendly options) at "[employer]," etc and make a list  Try tracking your food in an app (Lose it! App is free) - will help you set an expected calorie goal, and give you information about how much fat, carbs, protein you are consuming.   Be aware of "carbs" as a prediabetic - "fiber" is a carb that is GOOD - it's "free" and can have as much as you want - beans, vegetables, whole grains, sweet potatoes, fresh fruit are typically high in fiber  Recommend watching fat quality/intake    Dining out: help on how to choose  It is better if you can meal plan and eat at your house but sometimes life happens so here are some tips to help you make healthier choices while eating out:  1) Ask for your server to pack up 1/2 of your meal to take home for lunch or later. Portion sizes are huge so if you don't have all of it in front of you, you are less likely to eat it all.    2) Ask if they have a smaller portion or lunch portion available, this is often cheaper as well!  3) Ask to not have the bread basket brought to the table  4) Ask for the dressing on the side.   5) Look at the nutrition menu BEFORE you go to a restaurant or fast food chain and have what you will eat in mind. You can also look it up on food tracking ups like Myfitness Pal or Lost it. However the web site for the restaurant is usually the best bet.   6) Don't forget that you are the costumer, it is okay to ask them to leave things out, ask about substituting, or ask them to cook with less oil!  Here is some more general information for you!     Nonalcoholic Fatty Liver Disease Diet Nonalcoholic fatty liver disease is a condition that causes fat to accumulate in and around the liver. The disease makes it harder for the liver to work the way that it should. Following a healthy diet can help to keep nonalcoholic fatty liver disease under  control. It can also help to prevent or improve conditions that are associated with the disease, such as heart disease, diabetes, high blood pressure, and abnormal cholesterol levels. Along with regular exercise, this diet:  Promotes weight loss.  Helps to control blood sugar levels.  Helps to improve the way that the body uses insulin.  What do I need to know about this diet?  Use the glycemic index (GI) to plan your meals. The index tells you how quickly a food will raise your blood sugar. Choose low-GI foods. These foods take a longer time to raise blood sugar.  Keep track of how many calories you take in. Eating the right amount of calories will help you to achieve a healthy weight.  You may want to follow a Mediterranean diet. This diet includes a lot of vegetables, lean meats or fish, whole grains, fruits, and healthy oils and fats. What foods can I eat? Grains Whole grains, such as whole-wheat or whole-grain breads, crackers, tortillas, cereals, and pasta. Stone-ground whole wheat. Pumpernickel bread. Unsweetened oatmeal. Bulgur. Barley. Quinoa. Brown or wild rice. Corn or whole-wheat flour tortillas. Vegetables Lettuce. Spinach. Peas. Beets. Cauliflower. Cabbage. Broccoli. Carrots. Tomatoes. Squash. Eggplant. Herbs. Peppers. Onions. Cucumbers. Brussels sprouts. Yams and sweet potatoes.  Beans. Lentils. Fruits Bananas. Apples. Oranges. Grapes. Papaya. Mango. Pomegranate. Kiwi. Grapefruit. Cherries. Meats and Other Protein Sources Seafood and shellfish. Lean meats. Poultry. Tofu. Dairy Limit dairy - low fat options are high in carbs, high fat options have lots of saturated and trans "bad" fats Beverages Water. Sugar-free drinks. Tea. Coffee.  Milk alternatives, such as soy or almond milk. Real fruit juice. Condiments Mustard. Relish. Low-fat, low-sugar ketchup and barbecue sauce. Low-fat or fat-free mayonnaise. Sweets and Desserts Sugar-free sweets. Fats and Oils Avocado. Canola  or olive oil. Nuts and nut butters. Seeds. The items listed above may not be a complete list of recommended foods or beverages. Contact your dietitian for more options. What foods are not recommended? Palm oil and coconut oil. Processed foods. Fried foods. Sweetened drinks, such as sweet tea, milkshakes, snow cones, iced sweet drinks, and sodas. Alcohol. Sweets. Foods that contain a lot of salt or sodium. The items listed above may not be a complete list of foods and beverages to avoid. Contact your dietitian for more information. This information is not intended to replace advice given to you by your health care provider. Make sure you discuss any questions you have with your health care provider. Document Released: 01/21/2015 Document Revised: 02/12/2016 Document Reviewed: 10/01/2014 Elsevier Interactive Patient Education  [employer].

## 2017-12-14 NOTE — Progress Notes (Signed)
FOLLOW UP  Assessment and Plan:   Hypertension Well controlled with current medications  Monitor blood pressure at home; patient to call if consistently greater than 130/80 Continue DASH diet.   Reminder to go to the ER if any CP, SOB, nausea, dizziness, severe HA, changes vision/speech, left arm numbness and tingling and jaw pain.  Cholesterol Currently at LDL goal with red yeast rice; diet discussed for minor triglyceride elevation Continue low cholesterol diet and exercise.  Check lipid panel.   Prediabetes Continue diet and exercise.  Perform daily foot/skin check, notify office of any concerning changes.  Check A1C  Obesity with co morbidities Long discussion about weight loss, diet, and exercise Recommended diet heavy in fruits and veggies and low in animal meats, cheeses, and dairy products, appropriate calorie intake Discussed ideal weight for height  Patient will work on choosing healthy options for lunch, download app to track calories, macros Will follow up in 3 months  Vitamin D Def Near goal at last visit; continue supplementation for goal of 70-100 Defer Vit D level  Hypogonadism - continue replacement therapy, check testosterone levels at next visit (CPE)  Right sided chest pain Non-exertional, musculoskeletal (+tenderness) vs lung vs ?gallbladder (+nausea with fatty food intake) Recommended low fat diet, will obtain CXR as has never had in recent years Obtain LFT, BMP, CBC today, may follow up with abd [place] if CXR negative Recommended trial of 2 weeks of OTC NSAID analgesics with H2 inhibitor   Continue diet and meds as discussed. Further disposition pending results of labs. Discussed med's effects and SE's.   Over 30 minutes of exam, counseling, chart review, and critical decision making was performed.   Future Appointments  Date Time Provider [place]  01/13/2018 11:00 AM [facility] PREVISIT [id number] [facility]  01/30/2018  3:00 PM Gatha Mayer, MD [facility]  03/20/2018 11:00 AM [name], MD [facility] None  06/02/2018  7:30 AM Pieter Partridge, DO [facility] None    ----------------------------------------------------------------------------------------------------------------------  HPI 52 y.o. male  presents for 3 month follow up on hypertension, cholesterol, diabetes, weight and vitamin D deficiency. He reports intermittent right upper back pain, and correlating chest pain - ?chest wall, is tender to palpation. Pain "comes and goes" every few days, ongoing for several weeks now. Achy/Heideman, non-radiating. 5/10 - occasionally makes it difficulty to sleep at night. No associated with exertion, dyspnea, palpitations, eating, deep breaths, twisting, lifting. Has not tried any interventions for this but takes ibuprofen 400 mg occasionally. He does endorse ongoing nausea with intake of fatty food, ?coinciding with onset of pain. +fatty liver dz, no hx of Abd surgery. [place] 10/17/2014 - gallbladder without stones, thickening. He is a former smoker (quit in 1991).   He also has ongoing left medial knee pain with shooting down tibia that is noticeable after walking, ongoing for several months; he had similar 4 years ago, cannot recall diagnosis, but reports xrays were normal and was managed by sports medicine- he plans to follow up with them. Will provide referral back if needed.   BMI is Body mass index is 35.36 kg/m., he has not been working on diet- he does exercise - walking and exercise.  Wt Readings from Last 3 Encounters:  12/14/17 268 lb (121.6 kg)  11/30/17 271 lb (122.9 kg)  09/08/17 268 lb 9.6 oz (121.8 kg)   His blood pressure has been controlled at home, today their BP is BP: 124/76  He does workout. He denies chest pain, shortness of  breath, dizziness.   He is not on cholesterol medication (on red yeast rice supplement only) and denies myalgias. His LDL/total cholesterol cholesterol is at goal. The cholesterol last  visit was:   Lab Results  Component Value Date   CHOL 165 08/26/2017   HDL 40 (L) 08/26/2017   LDLCALC 98 08/26/2017   TRIG 161 (H) 08/26/2017   CHOLHDL 4.1 08/26/2017    He has been working on diet and exercise for prediabetes, and denies foot ulcerations, increased appetite, nausea, paresthesia of the feet, polydipsia, polyuria, visual disturbances, vomiting and weight loss. Last A1C in the office was:  Lab Results  Component Value Date   HGBA1C 5.9 (H) 08/26/2017   Patient is on Vitamin D supplement and approaching goal at recent check:    Lab Results  Component Value Date   VD25OH 16 08/26/2017     He has a history of testosterone deficiency and is on testosterone replacement. He states that the testosterone helps with his energy, libido, muscle mass. Lab Results  Component Value Date   TESTOSTERONE 903 (H) 02/03/2017     Current Medications:  Current Outpatient Medications on File Prior to Visit  Medication Sig  . aspirin 81 MG chewable tablet Chew by mouth daily.  . Cholecalciferol (VITAMIN D PO) Take 5,000 Units by mouth daily.  Marland Kitchen diltiazem (CARDIZEM) 120 MG tablet TAKE 1 TABLET BY MOUTH ONCE DAILY  . fluticasone (FLONASE) 50 MCG/ACT nasal spray USE ONE SPRAY EACH NOSTRIL TWICE DAILY  . lisinopril (PRINIVIL,ZESTRIL) 40 MG tablet TAKE 1 TABLET BY MOUTH ONCE DAILY  . Magnesium 250 MG TABS Take 250 mg by mouth daily.  . Methylcobalamin (B-12) 5000 MCG TBDP Take by mouth.  . Omega-3 Fatty Acids (FISH OIL) 1000 MG CAPS Take by mouth 4 (four) times daily.  Marland Kitchen oxcarbazepine (TRILEPTAL) 600 MG tablet Take 1 tablet (600 mg total) by mouth 2 (two) times daily.  . Red Yeast Rice Extract (RED YEAST RICE PO) Take 1,200 mg by mouth 2 (two) times daily.   . sildenafil (VIAGRA) 100 MG tablet 1/2-1 pill as needed 1 hour before intercourse, can get with good RX from [employer]  . valACYclovir (VALTREX) 1000 MG tablet TAKE 1 TABLET BY MOUTH ONCE DAILY  . Baclofen 5 MG TABS Take 5 mg by  mouth 3 (three) times daily. (Patient not taking: Reported on 12/14/2017)  . meclizine (ANTIVERT) 25 MG tablet 1/2-1 pill up to 3 times daily for motion sickness/dizziness (Patient not taking: Reported on 12/14/2017)  . predniSONE (DELTASONE) 20 MG tablet 1 tab 3 x day for 3 days, then 1 tab 2 x day for 3 days, then 1 tab 1 x day for 5 days (Patient not taking: Reported on 12/14/2017)  . [DISCONTINUED] isometheptene-acetaminophen-dichloralphenazone (MIDRIN) 65-325-100 MG capsule Take 1 capsule by mouth 4 (four) times daily as needed for migraine. Maximum 5 capsules in 12 hours for migraine headaches, 8 capsules in 24 hours for tension headaches.   No current facility-administered medications on file prior to visit.     Allergies:  Allergies  Allergen Reactions  . Toprol Xl [Metoprolol Tartrate]     ED     Medical History:  Past Medical History:  Diagnosis Date  . Elevated hemoglobin A1c   . Elevated LDL cholesterol level   . Hx of colonic polyp 11/20/2014  . Hypertension   . Hypogonadism male   . Vitamin D deficiency    Family history- Reviewed and unchanged Social history- Reviewed and unchanged  Review of Systems:  Review of Systems  Constitutional: Negative for malaise/fatigue and weight loss.  HENT: Negative for hearing loss and tinnitus.   Eyes: Negative for blurred vision and double vision.  Respiratory: Negative for cough, shortness of breath and wheezing.   Cardiovascular: Negative for chest pain, palpitations, orthopnea, claudication and leg swelling.  Gastrointestinal: Positive for nausea. Negative for abdominal pain, blood in stool, constipation, diarrhea, heartburn, melena and vomiting.       After fatty meals  Genitourinary: Negative.   Musculoskeletal: Negative for joint pain and myalgias.  Skin: Negative for rash.  Neurological: Negative for dizziness, tingling, sensory change, weakness and headaches.  Endo/Heme/Allergies: Negative for polydipsia.   Psychiatric/Behavioral: Negative.   All other systems reviewed and are negative.     Physical Exam: BP 124/76   Pulse 83   Temp (!) 97 F (36.1 C)   Ht 6\' 1"  (1.854 m)   Wt 268 lb (121.6 kg)   SpO2 95%   BMI 35.36 kg/m  Wt Readings from Last 3 Encounters:  12/14/17 268 lb (121.6 kg)  11/30/17 271 lb (122.9 kg)  09/08/17 268 lb 9.6 oz (121.8 kg)   General Appearance: Well nourished, in no apparent distress. Eyes: PERRLA, EOMs, conjunctiva no swelling or erythema Sinuses: No Frontal/maxillary tenderness ENT/Mouth: Ext aud canals clear, TMs without erythema, bulging. No erythema, swelling, or exudate on post pharynx.  Tonsils not swollen or erythematous. Hearing normal.  Neck: Supple, thyroid normal.  Respiratory: Respiratory effort normal, BS equal bilaterally without rales, rhonchi, wheezing or stridor.  Cardio: RRR with no MRGs. Brisk peripheral pulses without edema.  Abdomen: Soft, + BS.  Non tender, no guarding, rebound, hernias, masses. Lymphatics: Non tender without lymphadenopathy.  Musculoskeletal: Full ROM, 5/5 strength, Normal gait. Bilateral knees with full ROM without laxity, popping/clicking with McMurray, Lachman, drawer, right knee with palpable crepitus. No pain elicited in office. Non-tender without palpable bony abnormality, warmth to joint or effusion. Some tenderness to palpation of R sided costochondral area at  Skin: Warm, dry without rashes, lesions, ecchymosis.  Neuro: Cranial nerves intact. No cerebellar symptoms.  Psych: Awake and oriented X 3, normal affect, Insight and Judgment appropriate.    Izora Ribas, NP 4:51 PM [facility]

## 2017-12-15 LAB — HEPATIC FUNCTION PANEL
AG Ratio: 1.6 (calc) (ref 1.0–2.5)
ALBUMIN MSPROF: 4.8 g/dL (ref 3.6–5.1)
ALT: 36 U/L (ref 9–46)
AST: 33 U/L (ref 10–35)
Alkaline phosphatase (APISO): 65 U/L (ref 40–115)
BILIRUBIN DIRECT: 0.1 mg/dL (ref 0.0–0.2)
BILIRUBIN TOTAL: 0.5 mg/dL (ref 0.2–1.2)
Globulin: 3 g/dL (calc) (ref 1.9–3.7)
Indirect Bilirubin: 0.4 mg/dL (calc) (ref 0.2–1.2)
Total Protein: 7.8 g/dL (ref 6.1–8.1)

## 2017-12-15 LAB — BASIC METABOLIC PANEL WITH GFR
BUN: 14 mg/dL (ref 7–25)
CO2: 25 mmol/L (ref 20–32)
CREATININE: 1.11 mg/dL (ref 0.70–1.33)
Calcium: 9.6 mg/dL (ref 8.6–10.3)
Chloride: 99 mmol/L (ref 98–110)
GFR, EST AFRICAN AMERICAN: 87 mL/min/{1.73_m2} (ref >=60)
GFR, EST NON AFRICAN AMERICAN: 75 mL/min/{1.73_m2} (ref >=60)
Glucose, Bld: 98 mg/dL (ref 65–99)
POTASSIUM: 4.1 mmol/L (ref 3.5–5.3)
SODIUM: 133 mmol/L — AB (ref 135–146)

## 2017-12-15 LAB — LIPID PANEL
CHOL/HDL RATIO: 3.9 (calc) (ref <5.0)
Cholesterol: 172 mg/dL (ref <200)
HDL: 44 mg/dL (ref >40)
LDL CHOLESTEROL (CALC): 108 mg/dL — AB
Non-HDL Cholesterol (Calc): 128 mg/dL (calc) (ref <130)
Triglycerides: 107 mg/dL (ref <150)

## 2017-12-15 LAB — HEMOGLOBIN A1C
Hgb A1c MFr Bld: 6 % of total Hgb — ABNORMAL HIGH (ref <5.7)
MEAN PLASMA GLUCOSE: 126 (calc)
eAG (mmol/L): 7 (calc)

## 2017-12-15 LAB — CBC WITH DIFFERENTIAL/PLATELET
BASOS ABS: 60 {cells}/uL (ref 0–200)
Basophils Relative: 0.7 %
EOS ABS: 189 {cells}/uL (ref 15–500)
Eosinophils Relative: 2.2 %
HCT: 44.5 % (ref 38.5–50.0)
Hemoglobin: 15.7 g/dL (ref 13.2–17.1)
Lymphs Abs: 1763 cells/uL (ref 850–3900)
MCH: 30.7 pg (ref 27.0–33.0)
MCHC: 35.3 g/dL (ref 32.0–36.0)
MCV: 86.9 fL (ref 80.0–100.0)
MPV: 9 fL (ref 7.5–12.5)
Monocytes Relative: 10.1 %
NEUTROS PCT: 66.5 %
Neutro Abs: 5719 cells/uL (ref 1500–7800)
PLATELETS: 272 10*3/uL (ref 140–400)
RBC: 5.12 10*6/uL (ref 4.20–5.80)
RDW: 13.1 % (ref 11.0–15.0)
Total Lymphocyte: 20.5 %
WBC: 8.6 10*3/uL (ref 3.8–10.8)
WBCMIX: 869 {cells}/uL (ref 200–950)

## 2017-12-15 LAB — TSH: TSH: 2.16 mIU/L (ref 0.40–4.50)

## 2018-01-10 ENCOUNTER — Other Ambulatory Visit: Payer: Self-pay | Admitting: Internal Medicine

## 2018-01-11 ENCOUNTER — Other Ambulatory Visit: Payer: Self-pay | Admitting: Internal Medicine

## 2018-01-11 DIAGNOSIS — E291 Testicular hypofunction: Secondary | ICD-10-CM

## 2018-01-12 ENCOUNTER — Encounter: Payer: Self-pay | Admitting: Neurology

## 2018-01-13 ENCOUNTER — Encounter: Payer: Self-pay | Admitting: Internal Medicine

## 2018-01-13 ENCOUNTER — Other Ambulatory Visit: Payer: Self-pay

## 2018-01-13 ENCOUNTER — Ambulatory Visit (AMBULATORY_SURGERY_CENTER): Payer: Self-pay | Admitting: *Deleted

## 2018-01-13 VITALS — Ht 73.0 in | Wt 271.2 lb

## 2018-01-13 DIAGNOSIS — Z86010 Personal history of colonic polyps: Secondary | ICD-10-CM

## 2018-01-13 DIAGNOSIS — Z800 Family history of malignant neoplasm of digestive organs: Secondary | ICD-10-CM

## 2018-01-13 NOTE — Progress Notes (Signed)
No egg or soy allergy  No home oxygen used  No diet medications taken  No anesthesia or intubation problems per pt  Registered in [facility]

## 2018-01-30 ENCOUNTER — Other Ambulatory Visit: Payer: Self-pay

## 2018-01-30 ENCOUNTER — Other Ambulatory Visit: Payer: Self-pay | Admitting: Internal Medicine

## 2018-01-30 ENCOUNTER — Encounter: Payer: Self-pay | Admitting: Internal Medicine

## 2018-01-30 ENCOUNTER — Ambulatory Visit (AMBULATORY_SURGERY_CENTER): Payer: BC Managed Care – PPO | Admitting: Internal Medicine

## 2018-01-30 VITALS — BP 122/82 | HR 67 | Temp 97.1°F | Resp 12 | Ht 73.0 in | Wt 271.0 lb

## 2018-01-30 DIAGNOSIS — D123 Benign neoplasm of transverse colon: Secondary | ICD-10-CM

## 2018-01-30 DIAGNOSIS — D122 Benign neoplasm of ascending colon: Secondary | ICD-10-CM

## 2018-01-30 DIAGNOSIS — D125 Benign neoplasm of sigmoid colon: Secondary | ICD-10-CM

## 2018-01-30 DIAGNOSIS — K635 Polyp of colon: Secondary | ICD-10-CM

## 2018-01-30 DIAGNOSIS — Z860100 Personal history of colon polyps, unspecified: Secondary | ICD-10-CM

## 2018-01-30 HISTORY — PX: COLONOSCOPY: SHX174

## 2018-01-30 MED ORDER — SODIUM CHLORIDE 0.9 % IV SOLN: 500.0000 mL | Freq: Once | INTRAVENOUS | Status: DC

## 2018-01-30 NOTE — Progress Notes (Signed)
Pt's states no medical or surgical changes since previsit or office visit. 

## 2018-01-30 NOTE — Progress Notes (Signed)
To recovery, report to RN, VSS. 

## 2018-01-30 NOTE — Progress Notes (Signed)
Called to room to assist during endoscopic procedure.  Patient ID and intended procedure confirmed with present staff. Received instructions for my participation in the procedure from the performing physician.  

## 2018-01-30 NOTE — Patient Instructions (Addendum)
   I found and removed 3 polyps today.  I will let you know pathology results and when to have another routine colonoscopy by mail and/or My Chart.  I appreciate the opportunity to care for you. Gatha Mayer, MD, FACG  YOU HAD AN ENDOSCOPIC PROCEDURE TODAY AT [facility]:   Refer to the procedure report that was given to you for any specific questions about what was found during the examination.  If the procedure report does not answer your questions, please call your gastroenterologist to clarify.  If you requested that your care partner not be given the details of your procedure findings, then the procedure report has been included in a sealed envelope for you to review at your convenience later.  YOU SHOULD EXPECT: Some feelings of bloating in the abdomen. Passage of more gas than usual.  Walking can help get rid of the air that was put into your GI tract during the procedure and reduce the bloating. If you had a lower endoscopy (such as a colonoscopy or flexible sigmoidoscopy) you may notice spotting of blood in your stool or on the toilet paper. If you underwent a bowel prep for your procedure, you may not have a normal bowel movement for a few days.  Please Note:  You might notice some irritation and congestion in your nose or some drainage.  This is from the oxygen used during your procedure.  There is no need for concern and it should clear up in a day or so.  SYMPTOMS TO REPORT IMMEDIATELY:   Following lower endoscopy (colonoscopy or flexible sigmoidoscopy):  Excessive amounts of blood in the stool  Significant tenderness or worsening of abdominal pains  Swelling of the abdomen that is new, acute  Fever of 100F or higher   For urgent or emergent issues, a gastroenterologist can be reached at any hour by calling [phone]. No NSAIDS: ibuprofen, aleve, aspirin for 2 weeks   DIET:  We do recommend a small meal at first, but then you may proceed to your  regular diet.  Drink plenty of fluids but you should avoid alcoholic beverages for 24 hours.  ACTIVITY:  You should plan to take it easy for the rest of today and you should NOT DRIVE or use heavy machinery until tomorrow (because of the sedation medicines used during the test).    FOLLOW UP: Our staff will call the number listed on your records the next business day following your procedure to check on you and address any questions or concerns that you may have regarding the information given to you following your procedure. If we do not reach you, we will leave a message.  However, if you are feeling well and you are not experiencing any problems, there is no need to return our call.  We will assume that you have returned to your regular daily activities without incident.  If any biopsies were taken you will be contacted by phone or by letter within the next 1-3 weeks.  Please call us at [phone] if you have not heard about the biopsies in 3 weeks.    SIGNATURES/CONFIDENTIALITY: You and/or your care partner have signed paperwork which will be entered into your electronic medical record.  These signatures attest to the fact that that the information above on your After Visit Summary has been reviewed and is understood.  Full responsibility of the confidentiality of this discharge information lies with you and/or your care-partner.

## 2018-01-30 NOTE — Op Note (Signed)
[facility] Patient Name: Donald Schwartz Procedure Date: 01/30/2018 3:11 PM MRN: [id number] Endoscopist: Gatha Mayer , MD Age: 52 Referring MD:  Date of Birth: March 04, 1966 Gender: Male Account #: [id number] Procedure:                Colonoscopy Indications:              Surveillance: Personal history of adenomatous                            polyps on last colonoscopy 3 years ago Medicines:                Propofol per Anesthesia, Monitored Anesthesia Care Procedure:                Pre-Anesthesia Assessment:                           - Prior to the procedure, a History and Physical                            was performed, and patient medications and                            allergies were reviewed. The patient's tolerance of                            previous anesthesia was also reviewed. The risks                            and benefits of the procedure and the sedation                            options and risks were discussed with the patient.                            All questions were answered, and informed consent                            was obtained. Prior Anticoagulants: The patient has                            taken no previous anticoagulant or antiplatelet                            agents. ASA Grade Assessment: II - A patient with                            mild systemic disease. After reviewing the risks                            and benefits, the patient was deemed in                            satisfactory condition to undergo the procedure.  After obtaining informed consent, the colonoscope                            was passed under direct vision. Throughout the                            procedure, the patient's blood pressure, pulse, and                            oxygen saturations were monitored continuously. The                            Colonoscope was introduced through the anus and                             advanced to the the cecum, identified by                            appendiceal orifice and ileocecal valve. The                            colonoscopy was performed without difficulty. The                            patient tolerated the procedure well. The quality                            of the bowel preparation was good. The ileocecal                            valve, appendiceal orifice, and rectum were                            photographed. The bowel preparation used was                            Miralax. Scope In: 3:29:41 PM Scope Out: 3:48:20 PM Scope Withdrawal Time: 0 hours 13 minutes 18 seconds  Total Procedure Duration: 0 hours 18 minutes 39 seconds  Findings:                 The perianal and digital rectal examinations were                            normal. Pertinent negatives include normal prostate                            (size, shape, and consistency).                           A 8 mm polyp was found in the proximal sigmoid                            colon. The polyp was semi-pedunculated. The polyp  was removed with a hot snare. Resection and                            retrieval were complete. Verification of patient                            identification for the specimen was done. Estimated                            blood loss: none.                           Two sessile polyps were found in the transverse                            colon and ascending colon. The polyps were 3 to 5                            mm in size. These polyps were removed with a cold                            snare. Resection and retrieval were complete.                            Verification of patient identification for the                            specimen was done. Estimated blood loss was minimal.                           Many small and large-mouthed diverticula were found                            in the sigmoid colon and descending colon. There                             was narrowing of the colon in association with the                            diverticular opening.                           The exam was otherwise without abnormality on                            direct and retroflexion views. Complications:            No immediate complications. Estimated Blood Loss:     Estimated blood loss was minimal. Impression:               - One 8 mm polyp in the proximal sigmoid colon,                            removed with a hot snare. Resected and retrieved.                           -  Two 3 to 5 mm polyps in the transverse colon and                            in the ascending colon, removed with a cold snare.                            Resected and retrieved.                           - Severe diverticulosis in the sigmoid colon and in                            the descending colon. There was narrowing of the                            colon in association with the diverticular opening.                           - The examination was otherwise normal on direct                            and retroflexion views.                           - Personal history of colonic polyp traditional                            serrated adenoma 2016 and FHx CRCA multiple second                            degree relatives. Recommendation:           - Patient has a contact number available for                            emergencies. The signs and symptoms of potential                            delayed complications were discussed with the                            patient. Return to normal activities tomorrow.                            Written discharge instructions were provided to the                            patient.                           - Resume previous diet.                           - Continue present medications.                           -  No aspirin, ibuprofen, naproxen, or other                            non-steroidal  anti-inflammatory drugs for 2 weeks                            after polyp removal.                           - Repeat colonoscopy is recommended for                            surveillance. The colonoscopy date will be                            determined after pathology results from today's                            exam become available for review. Gatha Mayer, MD 01/30/2018 4:00:20 PM This report has been signed electronically.

## 2018-01-31 ENCOUNTER — Other Ambulatory Visit: Payer: Self-pay

## 2018-01-31 ENCOUNTER — Telehealth: Payer: Self-pay | Admitting: *Deleted

## 2018-01-31 DIAGNOSIS — Z79899 Other long term (current) drug therapy: Secondary | ICD-10-CM

## 2018-01-31 NOTE — Telephone Encounter (Signed)
No answer for post procedure call back. Left message and will attempt to call back later this afternoon. SM 

## 2018-01-31 NOTE — Telephone Encounter (Signed)
  Follow up Call-  Call back number 01/30/2018  Post procedure Call Back phone  # [phone]  Permission to leave phone message Yes  Some recent data might be hidden     Patient questions:  Do you have a fever, pain , or abdominal swelling? No. Pain Score  0 *  Have you tolerated food without any problems? Yes.    Have you been able to return to your normal activities? Yes.    Do you have any questions about your discharge instructions: Diet   No. Medications  No. Follow up visit  No.  Do you have questions or concerns about your Care? No.  Actions: * If pain score is 4 or above: No action needed, pain <4.

## 2018-02-06 ENCOUNTER — Encounter: Payer: Self-pay | Admitting: Physician Assistant

## 2018-02-08 ENCOUNTER — Encounter: Payer: Self-pay | Admitting: Internal Medicine

## 2018-02-08 DIAGNOSIS — Z86010 Personal history of colonic polyps: Secondary | ICD-10-CM

## 2018-02-08 NOTE — Progress Notes (Signed)
Traditional ssa, ssp and tubular adenoma Recall colon in 3 yrs My Chart letter

## 2018-03-19 ENCOUNTER — Encounter: Payer: Self-pay | Admitting: Internal Medicine

## 2018-03-19 NOTE — Patient Instructions (Signed)

## 2018-03-19 NOTE — Progress Notes (Signed)
[facility]   [name], M.D.     Uvaldo Bristle. Silverio Lay, P.A.-C Liane Comber, [place]                [address] [place], [place] [address] Telephone [phone] Telefax [phone] Annual  Screening/Preventative Visit  & Comprehensive Evaluation & Examination     This very nice 52 y.o. MWM presents for a Screening /Preventative Visit & comprehensive evaluation and management of multiple medical co-morbidities.  Patient has been followed for HTN, HLD, Prediabetes and Vitamin D Deficiency.  Patient has hx/o Rt Trigeminal Neuralgia and is also followed by Dr Tomi Likens since 2016  and with pain partially controlled w/Lyrica. Patient has hx/o OSA on CPAP with improved restorative sleep & sleep hygiene.      HTN predates circa 2002. Patient's BP has been controlled at home.  Today's BP is at goal - 128/82. Patient denies any cardiac symptoms as chest pain, palpitations, shortness of breath, dizziness or ankle swelling.     Patient's hyperlipidemia is controlled with diet and medications. Patient denies myalgias or other medication SE's. Last lipids were not at goal:  Lab Results  Component Value Date   CHOL 172 12/14/2017   HDL 44 12/14/2017   LDLCALC 108 (H) 12/14/2017   TRIG 107 12/14/2017   CHOLHDL 3.9 12/14/2017      Patient has hx of Morbid Obesity (BMI 35+) and prediabetes (A1c 6.3% / 2008) and patient denies reactive hypoglycemic symptoms, visual blurring, diabetic polys or paresthesias. Last A1c was not at goal:  Lab Results  Component Value Date   HGBA1C 6.0 (H) 12/14/2017       Patient is on parenteral Testosterone replacement with improved sense of well-being. Finally, patient has history of Vitamin D Deficiency ("31"/2008) and last vitamin D was not at goal (70-100): Lab Results  Component Value Date   VD25OH 55 08/26/2017   Current Outpatient Medications on File Prior to Visit   Medication Sig  . aspirin 81 MG chewable tablet Chew by mouth daily.  . Cholecalciferol (VITAMIN D PO) Take 5,000 Units by mouth daily.  Marland Kitchen diltiazem (CARDIZEM) 120 MG tablet TAKE 1 TABLET BY MOUTH ONCE DAILY  . fluticasone (FLONASE) 50 MCG/ACT nasal spray USE ONE SPRAY EACH NOSTRIL TWICE DAILY  . lisinopril (PRINIVIL,ZESTRIL) 40 MG tablet TAKE 1 TABLET BY MOUTH ONCE DAILY  . Magnesium 250 MG TABS Take 250 mg by mouth daily.  . Methylcobalamin (B-12) 5000 MCG TBDP Take by mouth.  . Omega-3 Fatty Acids (FISH OIL) 1000 MG CAPS Take by mouth 4 (four) times daily.  Marland Kitchen oxcarbazepine (TRILEPTAL) 600 MG tablet Take 1 tablet (600 mg total) by mouth 2 (two) times daily.  . Red Yeast Rice Extract (RED YEAST RICE PO) Take 1,200 mg by mouth 2 (two) times daily.   . sildenafil (VIAGRA) 100 MG tablet 1/2-1 pill as needed 1 hour before intercourse, can get with good RX from [employer]  . SYRINGE-NEEDLE, DISP, 3 ML (B-D 3CC LUER-LOK SYR 21GX1") 21G X 1" 3 ML MISC USE AS DIRECTED  . testosterone cypionate (DEPOTESTOSTERONE CYPIONATE) 200 MG/ML injection INJECT 2 ML INTRAMUSCULARLY EVERY 2 WEEKS (Patient taking differently: INJECT 2 ML INTRAMUSCULARLY EVERY 3 weeks)   No current facility-administered medications on file prior to visit.    Allergies  Allergen Reactions  . Toprol Xl [Metoprolol Tartrate]  ED   Past Medical History:  Diagnosis Date  . Allergy   . Arthritis   . Elevated hemoglobin A1c   . Elevated LDL cholesterol level   . Hx of colonic polyp 11/20/2014  . Hypertension   . Hypogonadism male   . Neuromuscular disorder (HCC)    Trigeminar neuralgia  . Sleep apnea    wears CPAP  . Vitamin D deficiency    Health Maintenance  Topic Date Due  . INFLUENZA VACCINE  04/20/2018  . COLONOSCOPY  01/30/2021  . TETANUS/TDAP  02/02/2026  . Hepatitis C Screening  Completed  . HIV Screening  Completed   Immunization History  Administered Date(s) Administered  . Influenza Inj Mdck Quad  With Preservative 06/02/2017  . Influenza Split 07/10/2014  . Influenza-Unspecified 07/19/2016  . PPD Test 01/07/2014  . Pneumococcal-Unspecified 09/20/2009  . Td 09/20/2005  . Tdap 02/03/2016   Last Colon - 01/30/2018 - Dr Carlean Purl - Adenoma polyp-  Recc 3 yr f/u due May 2022  Past Surgical History:  Procedure Laterality Date  . c5-6 fusion     2012  . COLONOSCOPY    . HIP FRACTURE SURGERY Right 1986   avulsion/chip fracture ?stress fracture   Family History  Problem Relation Age of Onset  . Colon cancer Maternal Uncle   . Prostate cancer Maternal Uncle   . Colon cancer Paternal Uncle   . Colon cancer Maternal Grandfather   . Colon cancer Paternal Grandfather   . Prostate cancer Father   . Stomach cancer Neg Hx   . Esophageal cancer Neg Hx    Social History   Socioeconomic History  . Marital status: Married    Spouse name: Neoma Laming   . Number of children: None  Occupational History  . [employer]  Tobacco Use  . Smoking status: Former Smoker    Last attempt to quit: 09/20/1989    Years since quitting: 28.5  . Smokeless tobacco: Never Used  Substance and Sexual Activity  . Alcohol use: No    Alcohol/week: 0.0 oz  . Drug use: No  . Sexual activity: Yes    Partners: Female    ROS Constitutional: Denies fever, chills, weight loss/gain, headaches, insomnia,  night sweats or change in appetite. Does c/o fatigue. Eyes: Denies redness, blurred vision, diplopia, discharge, itchy or watery eyes.  ENT: Denies discharge, congestion, post nasal drip, epistaxis, sore throat, earache, hearing loss, dental pain, Tinnitus, Vertigo, Sinus pain or snoring.  Cardio: Denies chest pain, palpitations, irregular heartbeat, syncope, dyspnea, diaphoresis, orthopnea, PND, claudication or edema Respiratory: denies cough, dyspnea, DOE, pleurisy, hoarseness, laryngitis or wheezing.  Gastrointestinal: Denies dysphagia, heartburn, reflux, water brash, pain, cramps, nausea,  vomiting, bloating, diarrhea, constipation, hematemesis, melena, hematochezia, jaundice or hemorrhoids Genitourinary: Denies dysuria, frequency, urgency, nocturia, hesitancy, discharge, hematuria or flank pain Musculoskeletal: Denies arthralgia, myalgia, stiffness, Jt. Swelling, pain, limp or strain/sprain. Denies Falls. Skin: Denies puritis, rash, hives, warts, acne, eczema or change in skin lesion Neuro: No weakness, tremor, incoordination, spasms, paresthesia or pain Psychiatric: Denies confusion, memory loss or sensory loss. Denies Depression. Endocrine: Denies change in weight, skin, hair change, nocturia, and paresthesia, diabetic polys, visual blurring or hyper / hypo glycemic episodes.  Heme/Lymph: No excessive bleeding, bruising or enlarged lymph nodes.  Physical Exam  BP 128/82   Pulse 64   Temp (!) 97.2 F (36.2 C)   Resp 18   Ht 6\' 1"  (1.854 m)   Wt 269 lb 3.2 oz (122.1 kg)   BMI  35.52 kg/m   General Appearance: Over  nourished and well groomed and in no apparent distress.  Eyes: PERRLA, EOMs, conjunctiva no swelling or erythema, normal fundi and vessels. Sinuses: No frontal/maxillary tenderness ENT/Mouth: EACs patent / TMs  nl. Nares clear without erythema, swelling, mucoid exudates. Oral hygiene is good. No erythema, swelling, or exudate. Tongue normal, non-obstructing. Tonsils not swollen or erythematous. Hearing normal.  Neck: Supple, thyroid not palpable. No bruits, nodes or JVD. Respiratory: Respiratory effort normal.  BS equal and clear bilateral without rales, rhonci, wheezing or stridor. Cardio: Heart sounds are normal with regular rate and rhythm and no murmurs, rubs or gallops. Peripheral pulses are normal and equal bilaterally without edema. No aortic or femoral bruits. Chest: symmetric with normal excursions and percussion.  Abdomen: Soft, with Nl bowel sounds. Nontender, no guarding, rebound, hernias, masses, or organomegaly.  Lymphatics: Non tender without  lymphadenopathy.  Genitourinary: No hernias.Testes nl. DRE - prostate nl for age - smooth & firm w/o nodules. Musculoskeletal: Full ROM all peripheral extremities, joint stability, 5/5 strength, and normal gait. Skin: Warm and dry without rashes, lesions, cyanosis, clubbing or  ecchymosis.  Neuro: Cranial nerves intact, reflexes equal bilaterally. Normal muscle tone, no cerebellar symptoms. Sensation intact.  Pysch: Alert and oriented X 3 with normal affect, insight and judgment appropriate.   Assessment and Plan  1. Annual Preventative/Screening Exam   2. Essential hypertension  - EKG 12-Lead - [place], RETROPERITNL ABD,  LTD - Urinalysis, Routine w reflex microscopic - Microalbumin / creatinine urine ratio - CBC with Differential/Platelet - COMPLETE METABOLIC PANEL WITH GFR - Magnesium - TSH  3. Hyperlipidemia, mixed  - EKG 12-Lead - [place], RETROPERITNL ABD,  LTD - Lipid panel - TSH  4. Prediabetes  - EKG 12-Lead - [place], RETROPERITNL ABD,  LTD - Hemoglobin A1c - Insulin, random  5. Vitamin D deficiency  - VITAMIN D 25 Hydroxyl  6. Testosterone Deficiency  - Testosterone  7. OSA on CPAP   8. Trigeminal neuralgia of right side of face   9. Screening for colorectal cancer  - POC Hemoccult Bld/Stl   10. Prostate cancer screening  - PSA  11. Screening for ischemic heart disease  - EKG 12-Lead  12. Former smoker  - EKG 12-Lead - [place], RETROPERITNL ABD,  LTD  13. FH: hypertension  - EKG 12-Lead - [place], RETROPERITNL ABD,  LTD  14. Screening for AAA (aortic abdominal aneurysm)  - [place], RETROPERITNL ABD,  LTD  15. Fatigue  - Iron,Total/Total Iron Binding Cap - Vitamin B12 - Testosterone - CBC with Differential/Platelet - TSH  16. Medication management  - Urinalysis, Routine w reflex microscopic - Microalbumin / creatinine urine ratio - CBC with Differential/Platelet - COMPLETE METABOLIC PANEL WITH GFR - Magnesium - Lipid panel - TSH - Hemoglobin  A1c - Insulin, random - VITAMIN D 25 Hydroxyl        Patient was counseled in prudent diet, weight control to achieve/maintain BMI less than 25, BP monitoring, regular exercise and medications as discussed.  Discussed med effects and SE's. Routine screening labs and tests as requested with regular follow-up as recommended. Over 40 minutes of exam, counseling, chart review and high complex critical decision making was performed

## 2018-03-20 ENCOUNTER — Ambulatory Visit: Payer: BC Managed Care – PPO | Admitting: Internal Medicine

## 2018-03-20 ENCOUNTER — Encounter: Payer: Self-pay | Admitting: Internal Medicine

## 2018-03-20 VITALS — BP 128/82 | HR 64 | Temp 97.2°F | Resp 18 | Ht 73.0 in | Wt 269.2 lb

## 2018-03-20 DIAGNOSIS — Z111 Encounter for screening for respiratory tuberculosis: Secondary | ICD-10-CM

## 2018-03-20 DIAGNOSIS — Z1329 Encounter for screening for other suspected endocrine disorder: Secondary | ICD-10-CM | POA: Diagnosis not present

## 2018-03-20 DIAGNOSIS — Z136 Encounter for screening for cardiovascular disorders: Secondary | ICD-10-CM | POA: Diagnosis not present

## 2018-03-20 DIAGNOSIS — L2082 Flexural eczema: Secondary | ICD-10-CM

## 2018-03-20 DIAGNOSIS — Z131 Encounter for screening for diabetes mellitus: Secondary | ICD-10-CM | POA: Diagnosis not present

## 2018-03-20 DIAGNOSIS — E66812 Obesity, class 2: Secondary | ICD-10-CM

## 2018-03-20 DIAGNOSIS — Z1389 Encounter for screening for other disorder: Secondary | ICD-10-CM | POA: Diagnosis not present

## 2018-03-20 DIAGNOSIS — Z130 Encounter for screening for diseases of the blood and blood-forming organs and certain disorders involving the immune mechanism: Secondary | ICD-10-CM | POA: Diagnosis not present

## 2018-03-20 DIAGNOSIS — Z13220 Encounter for screening for lipoid disorders: Secondary | ICD-10-CM | POA: Diagnosis not present

## 2018-03-20 DIAGNOSIS — Z8249 Family history of ischemic heart disease and other diseases of the circulatory system: Secondary | ICD-10-CM

## 2018-03-20 DIAGNOSIS — B351 Tinea unguium: Secondary | ICD-10-CM

## 2018-03-20 DIAGNOSIS — Z0001 Encounter for general adult medical examination with abnormal findings: Secondary | ICD-10-CM

## 2018-03-20 DIAGNOSIS — E782 Mixed hyperlipidemia: Secondary | ICD-10-CM

## 2018-03-20 DIAGNOSIS — I10 Essential (primary) hypertension: Secondary | ICD-10-CM | POA: Diagnosis not present

## 2018-03-20 DIAGNOSIS — E291 Testicular hypofunction: Secondary | ICD-10-CM

## 2018-03-20 DIAGNOSIS — Z79899 Other long term (current) drug therapy: Secondary | ICD-10-CM | POA: Diagnosis not present

## 2018-03-20 DIAGNOSIS — E559 Vitamin D deficiency, unspecified: Secondary | ICD-10-CM

## 2018-03-20 DIAGNOSIS — Z9989 Dependence on other enabling machines and devices: Secondary | ICD-10-CM

## 2018-03-20 DIAGNOSIS — G4733 Obstructive sleep apnea (adult) (pediatric): Secondary | ICD-10-CM

## 2018-03-20 DIAGNOSIS — R7303 Prediabetes: Secondary | ICD-10-CM

## 2018-03-20 DIAGNOSIS — Z0000 Encounter for general adult medical examination without abnormal findings: Secondary | ICD-10-CM | POA: Diagnosis not present

## 2018-03-20 DIAGNOSIS — Z1211 Encounter for screening for malignant neoplasm of colon: Secondary | ICD-10-CM

## 2018-03-20 DIAGNOSIS — Z1212 Encounter for screening for malignant neoplasm of rectum: Secondary | ICD-10-CM

## 2018-03-20 DIAGNOSIS — R5383 Other fatigue: Secondary | ICD-10-CM

## 2018-03-20 DIAGNOSIS — Z6835 Body mass index (BMI) 35.0-35.9, adult: Secondary | ICD-10-CM

## 2018-03-20 DIAGNOSIS — G500 Trigeminal neuralgia: Secondary | ICD-10-CM

## 2018-03-20 DIAGNOSIS — Z87891 Personal history of nicotine dependence: Secondary | ICD-10-CM

## 2018-03-20 DIAGNOSIS — Z125 Encounter for screening for malignant neoplasm of prostate: Secondary | ICD-10-CM

## 2018-03-20 MED ORDER — ASPIRIN 81 MG PO CHEW: 81.0000 mg | CHEWABLE_TABLET | Freq: Every day | ORAL | Status: AC

## 2018-03-20 MED ORDER — TRIAMCINOLONE ACETONIDE 0.5 % EX OINT
1.0000 | TOPICAL_OINTMENT | Freq: Two times a day (BID) | CUTANEOUS | 3 refills | Status: DC
Start: 2018-03-20 — End: 2019-10-23

## 2018-03-20 MED ORDER — TERBINAFINE HCL 250 MG PO TABS: 250.0000 mg | ORAL_TABLET | Freq: Every day | ORAL | 0 refills | Status: DC

## 2018-03-20 MED ORDER — PHENTERMINE HCL 37.5 MG PO TABS: ORAL_TABLET | ORAL | 5 refills | Status: DC

## 2018-03-21 LAB — CBC WITH DIFFERENTIAL/PLATELET
BASOS ABS: 28 {cells}/uL (ref 0–200)
Basophils Relative: 0.5 %
EOS ABS: 112 {cells}/uL (ref 15–500)
EOS PCT: 2 %
HCT: 49 % (ref 38.5–50.0)
Hemoglobin: 16.4 g/dL (ref 13.2–17.1)
Lymphs Abs: 1305 cells/uL (ref 850–3900)
MCH: 29.6 pg (ref 27.0–33.0)
MCHC: 33.5 g/dL (ref 32.0–36.0)
MCV: 88.4 fL (ref 80.0–100.0)
MONOS PCT: 13.8 %
MPV: 9 fL (ref 7.5–12.5)
NEUTROS PCT: 60.4 %
Neutro Abs: 3382 cells/uL (ref 1500–7800)
PLATELETS: 286 10*3/uL (ref 140–400)
RBC: 5.54 10*6/uL (ref 4.20–5.80)
RDW: 13.4 % (ref 11.0–15.0)
TOTAL LYMPHOCYTE: 23.3 %
WBC mixed population: 773 cells/uL (ref 200–950)
WBC: 5.6 10*3/uL (ref 3.8–10.8)

## 2018-03-21 LAB — MICROALBUMIN / CREATININE URINE RATIO
Creatinine, Urine: 151 mg/dL (ref 20–320)
MICROALB/CREAT RATIO: 11 ug/mg{creat} (ref <30)
Microalb, Ur: 1.7 mg/dL

## 2018-03-21 LAB — LIPID PANEL
CHOLESTEROL: 182 mg/dL (ref <200)
HDL: 40 mg/dL — ABNORMAL LOW (ref >40)
LDL Cholesterol (Calc): 117 mg/dL (calc) — ABNORMAL HIGH
Non-HDL Cholesterol (Calc): 142 mg/dL (calc) — ABNORMAL HIGH (ref <130)
Total CHOL/HDL Ratio: 4.6 (calc) (ref <5.0)
Triglycerides: 135 mg/dL (ref <150)

## 2018-03-21 LAB — URINALYSIS, ROUTINE W REFLEX MICROSCOPIC
Bilirubin Urine: NEGATIVE
GLUCOSE, UA: NEGATIVE
Hgb urine dipstick: NEGATIVE
Ketones, ur: NEGATIVE
Leukocytes, UA: NEGATIVE
Nitrite: NEGATIVE
PH: 7 (ref 5.0–8.0)
Protein, ur: NEGATIVE
Specific Gravity, Urine: 1.021 (ref 1.001–1.03)

## 2018-03-21 LAB — PSA: PSA: 0.9 ng/mL (ref <=4.0)

## 2018-03-21 LAB — TESTOSTERONE: Testosterone: 608 ng/dL (ref 250–827)

## 2018-03-21 LAB — COMPLETE METABOLIC PANEL WITH GFR
AG RATIO: 1.6 (calc) (ref 1.0–2.5)
ALKALINE PHOSPHATASE (APISO): 57 U/L (ref 40–115)
ALT: 27 U/L (ref 9–46)
AST: 33 U/L (ref 10–35)
Albumin: 4.7 g/dL (ref 3.6–5.1)
BUN/Creatinine Ratio: 7 (calc) (ref 6–22)
BUN: 10 mg/dL (ref 7–25)
CHLORIDE: 99 mmol/L (ref 98–110)
CO2: 30 mmol/L (ref 20–32)
CREATININE: 1.34 mg/dL — AB (ref 0.70–1.33)
Calcium: 9.9 mg/dL (ref 8.6–10.3)
GFR, Est African American: 70 mL/min/{1.73_m2} (ref >=60)
GFR, Est Non African American: 60 mL/min/{1.73_m2} (ref >=60)
GLOBULIN: 2.9 g/dL (ref 1.9–3.7)
Glucose, Bld: 98 mg/dL (ref 65–99)
Potassium: 4.5 mmol/L (ref 3.5–5.3)
SODIUM: 138 mmol/L (ref 135–146)
Total Bilirubin: 0.6 mg/dL (ref 0.2–1.2)
Total Protein: 7.6 g/dL (ref 6.1–8.1)

## 2018-03-21 LAB — IRON, TOTAL/TOTAL IRON BINDING CAP
%SAT: 20 % (ref 20–48)
Iron: 74 ug/dL (ref 50–180)
TIBC: 368 mcg/dL (calc) (ref 250–425)

## 2018-03-21 LAB — HEMOGLOBIN A1C
EAG (MMOL/L): 6.6 (calc)
Hgb A1c MFr Bld: 5.8 % of total Hgb — ABNORMAL HIGH (ref <5.7)
MEAN PLASMA GLUCOSE: 120 (calc)

## 2018-03-21 LAB — MAGNESIUM: MAGNESIUM: 2.2 mg/dL (ref 1.5–2.5)

## 2018-03-21 LAB — VITAMIN D 25 HYDROXY (VIT D DEFICIENCY, FRACTURES): Vit D, 25-Hydroxy: 65 ng/mL (ref 30–100)

## 2018-03-21 LAB — VITAMIN B12: VITAMIN B 12: 455 pg/mL (ref 200–1100)

## 2018-03-21 LAB — INSULIN, RANDOM: INSULIN: 19.3 u[IU]/mL (ref 2.0–19.6)

## 2018-03-21 LAB — TSH: TSH: 1.25 m[IU]/L (ref 0.40–4.50)

## 2018-03-22 LAB — TB SKIN TEST
Induration: 0 mm
TB Skin Test: NEGATIVE

## 2018-04-04 ENCOUNTER — Other Ambulatory Visit: Payer: Self-pay | Admitting: Neurology

## 2018-04-21 ENCOUNTER — Other Ambulatory Visit: Payer: BC Managed Care – PPO

## 2018-04-21 ENCOUNTER — Other Ambulatory Visit: Payer: Self-pay | Admitting: Internal Medicine

## 2018-04-21 DIAGNOSIS — B351 Tinea unguium: Secondary | ICD-10-CM

## 2018-04-21 DIAGNOSIS — Z79899 Other long term (current) drug therapy: Secondary | ICD-10-CM

## 2018-04-21 LAB — HEPATIC FUNCTION PANEL
AG Ratio: 1.8 (calc) (ref 1.0–2.5)
ALKALINE PHOSPHATASE (APISO): 62 U/L (ref 40–115)
ALT: 31 U/L (ref 9–46)
AST: 32 U/L (ref 10–35)
Albumin: 4.6 g/dL (ref 3.6–5.1)
BILIRUBIN DIRECT: 0.1 mg/dL (ref 0.0–0.2)
BILIRUBIN INDIRECT: 0.4 mg/dL (ref 0.2–1.2)
BILIRUBIN TOTAL: 0.5 mg/dL (ref 0.2–1.2)
Globulin: 2.5 g/dL (calc) (ref 1.9–3.7)
Total Protein: 7.1 g/dL (ref 6.1–8.1)

## 2018-05-30 ENCOUNTER — Other Ambulatory Visit: Payer: Self-pay | Admitting: *Deleted

## 2018-05-30 ENCOUNTER — Telehealth: Payer: Self-pay | Admitting: Neurology

## 2018-05-30 DIAGNOSIS — Z79899 Other long term (current) drug therapy: Secondary | ICD-10-CM

## 2018-05-30 NOTE — Telephone Encounter (Signed)
Patient called and wants to know if you can call and let him know if he needs lab work prior to his appointment with Dr. Tomi Likens on Friday 9/13? Thanks

## 2018-05-30 NOTE — Telephone Encounter (Signed)
Patient notified to come in tomorrow for lab work.  Order placed.

## 2018-05-31 ENCOUNTER — Other Ambulatory Visit (INDEPENDENT_AMBULATORY_CARE_PROVIDER_SITE_OTHER): Payer: BC Managed Care – PPO

## 2018-05-31 DIAGNOSIS — Z79899 Other long term (current) drug therapy: Secondary | ICD-10-CM

## 2018-05-31 LAB — BASIC METABOLIC PANEL
BUN: 9 mg/dL (ref 6–23)
CHLORIDE: 96 meq/L (ref 96–112)
CO2: 30 mEq/L (ref 19–32)
CREATININE: 1.28 mg/dL (ref 0.40–1.50)
Calcium: 9.9 mg/dL (ref 8.4–10.5)
GFR: 62.22 mL/min (ref >60.00)
Glucose, Bld: 99 mg/dL (ref 70–99)
Potassium: 4.3 mEq/L (ref 3.5–5.1)
Sodium: 132 mEq/L — ABNORMAL LOW (ref 135–145)

## 2018-05-31 NOTE — Progress Notes (Signed)
NEUROLOGY FOLLOW UP OFFICE NOTE  Donald Schwartz [id number]  HISTORY OF PRESENT ILLNESS: Donald Schwartz is a 44 year o ld right-handed male with hypertension, CKD stage 2, hyperlipidemia, and OSA who follows up for right-sided trigeminal neuralgia.  UPDATE: Current medication:  Oxcarbazepine 600mg  twice daily.  Baclofen was not covered by insurance.  Pain has generally been controlled, however weather had been warm/hot.  No severe flare up but sometimes he may have a twinge.  Drying his face with a towel or brushes his teeth with electric toothbrush, he may trigger a twinge.  05/31/18 Na 132, K 4.3, BUN 9, Cr 1.28.Marland Kitchen  HISTORY: Beginning in January 2016, he started having pain involving the right side of the face. It started in the right eye, then involving the right top of head, right side of nose and right upper teeth. It is a paroxysmal shooting pain lasting a few seconds at a time and occurring several times daily. It is triggered by lightly touching the cheek, eating or the feeling of water running over his head. Sometimes there is a slight tingling but usually not. Nothing relieves it, it aborts spontaneously.  There is no numbness or facial weakness. He was evaluated by the eye doctor and dentist, with normal exams.   Prior medications: Lyrica. 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  PAST MEDICAL HISTORY: Past Medical History:  Diagnosis Date  . Allergy   . Arthritis   . Elevated hemoglobin A1c   . Elevated LDL cholesterol level   . Hx of colonic polyp 11/20/2014  . Hypertension   . Hypogonadism male   . Neuromuscular disorder (HCC)    Trigeminar neuralgia  . Sleep apnea    wears CPAP  . Vitamin D deficiency     MEDICATIONS: Current Outpatient Medications on File Prior to Visit  Medication Sig Dispense Refill  . aspirin 81 MG chewable tablet Chew 1 tablet (81 mg total) by mouth daily.    . Cholecalciferol (VITAMIN D PO)  Take 5,000 Units by mouth daily.    Marland Kitchen diltiazem (CARDIZEM) 120 MG tablet TAKE 1 TABLET BY MOUTH ONCE DAILY 90 tablet 1  . fluticasone (FLONASE) 50 MCG/ACT nasal spray USE ONE SPRAY EACH NOSTRIL TWICE DAILY 16 g 3  . lisinopril (PRINIVIL,ZESTRIL) 40 MG tablet TAKE 1 TABLET BY MOUTH ONCE DAILY 90 tablet 1  . Magnesium 250 MG TABS Take 250 mg by mouth daily.    . Methylcobalamin (B-12) 5000 MCG TBDP Take by mouth.    . Omega-3 Fatty Acids (FISH OIL) 1000 MG CAPS Take by mouth 4 (four) times daily.    Marland Kitchen oxcarbazepine (TRILEPTAL) 600 MG tablet TAKE 1 TABLET BY MOUTH TWICE DAILY 60 tablet 5  . phentermine (ADIPEX-P) 37.5 MG tablet Take 1/2 to 1 tablet every morning for dieting & weightloss 30 tablet 5  . Red Yeast Rice Extract (RED YEAST RICE PO) Take 1,200 mg by mouth 2 (two) times daily.     . sildenafil (VIAGRA) 100 MG tablet 1/2-1 pill as needed 1 hour before intercourse, can get with good RX from [employer] 10 tablet 2  . SYRINGE-NEEDLE, DISP, 3 ML (B-D 3CC LUER-LOK SYR 21GX1") 21G X 1" 3 ML MISC USE AS DIRECTED 100 each 0  . terbinafine (LAMISIL) 250 MG tablet Take 1 tablet (250 mg total) by mouth daily. 90 tablet 0  . testosterone cypionate (DEPOTESTOSTERONE CYPIONATE) 200 MG/ML injection INJECT 2 ML INTRAMUSCULARLY EVERY 2 WEEKS (Patient taking  differently: INJECT 2 ML INTRAMUSCULARLY EVERY 3 weeks) 10 mL 3  . triamcinolone ointment (KENALOG) 0.5 % Apply 1 application topically 2 (two) times daily. Apply to rash 2 to 3 x/day 60 g 3   No current facility-administered medications on file prior to visit.     ALLERGIES: Allergies  Allergen Reactions  . Toprol Xl [Metoprolol Tartrate]     ED    FAMILY HISTORY: Family History  Problem Relation Age of Onset  . Colon cancer Maternal Uncle   . Prostate cancer Maternal Uncle   . Colon cancer Paternal Uncle   . Colon cancer Maternal Grandfather   . Colon cancer Paternal Grandfather   . Prostate cancer Father   . Stomach cancer Neg Hx     . Esophageal cancer Neg Hx     SOCIAL HISTORY: Social History   Socioeconomic History  . Marital status: Married    Spouse name: Not on file  . Number of children: Not on file  . Years of education: Not on file  . Highest education level: Not on file  Occupational History  . Not on file  Social Needs  . Financial resource strain: Not on file  . Food insecurity:    Worry: Not on file    Inability: Not on file  . Transportation needs:    Medical: Not on file    Non-medical: Not on file  Tobacco Use  . Smoking status: Former Smoker    Last attempt to quit: 09/20/1989    Years since quitting: 28.7  . Smokeless tobacco: Never Used  Substance and Sexual Activity  . Alcohol use: No    Alcohol/week: 0.0 standard drinks  . Drug use: No  . Sexual activity: Yes    Partners: Female  Lifestyle  . Physical activity:    Days per week: Not on file    Minutes per session: Not on file  . Stress: Not on file  Relationships  . Social connections:    Talks on phone: Not on file    Gets together: Not on file    Attends religious service: Not on file    Active member of club or organization: Not on file    Attends meetings of clubs or organizations: Not on file    Relationship status: Not on file  . Intimate partner violence:    Fear of current or ex partner: Not on file    Emotionally abused: Not on file    Physically abused: Not on file    Forced sexual activity: Not on file  Other Topics Concern  . Not on file  Social History Narrative  . Not on file    REVIEW OF SYSTEMS: Constitutional: No fevers, chills, or sweats, no generalized fatigue, change in appetite Eyes: No visual changes, double vision, eye pain Ear, nose and throat: No hearing loss, ear pain, nasal congestion, sore throat Cardiovascular: No chest pain, palpitations Respiratory:  No shortness of breath at rest or with exertion, wheezes GastrointestinaI: No nausea, vomiting, diarrhea, abdominal pain, fecal  incontinence Genitourinary:  No dysuria, urinary retention or frequency Musculoskeletal:  No neck pain, back pain Integumentary: No rash, pruritus, skin lesions Neurological: as above Psychiatric: No depression, insomnia, anxiety Endocrine: No palpitations, fatigue, diaphoresis, mood swings, change in appetite, change in weight, increased thirst Hematologic/Lymphatic:  No purpura, petechiae. Allergic/Immunologic: no itchy/runny eyes, nasal congestion, recent allergic reactions, rashes  PHYSICAL EXAM: Blood pressure 116/70, pulse 88, height 6\' 1"  (1.854 m), weight 253 lb (114.8 kg), SpO2  97 %. General: No acute distress.  Patient appears well-groomed.  Head:  Normocephalic/atraumatic Eyes:  Fundi examined but not visualized Neck: supple, no paraspinal tenderness, full range of motion Heart:  Regular rate and rhythm Lungs:  Clear to auscultation bilaterally Back: No paraspinal tenderness Neurological Exam: alert and oriented to person, place, and time. Attention span and concentration intact, recent and remote memory intact, fund of knowledge intact.  Speech fluent and not dysarthric, language intact.  CN II-XII intact. Bulk and tone normal, muscle strength 5/5 throughout.  Sensation to light touch, temperature and vibration intact.  Deep tendon reflexes 2+ throughout, toes downgoing.  Finger to nose and heel to shin testing intact.  Gait normal, Romberg negative.  IMPRESSION: Right-sided trigeminal neuralgia  PLAN: 1.  Continue oxcarbazepine 600mg  twice daily.  If he has a flare-up, we will appeal the insurance company to approve baclofen 10mg  three times daily. If not, we can start lamotrigine. 2.  I will have him follow up in 3 months as the weather will become cooler, which may trigger the neuralgia.  We will repeat BMP prior to follow up.  15 minutes spent face to face with patient, over 50% spent discussing plan  Metta Clines, DO  CC: [name], MD

## 2018-06-02 ENCOUNTER — Encounter: Payer: Self-pay | Admitting: Neurology

## 2018-06-02 ENCOUNTER — Ambulatory Visit: Payer: BC Managed Care – PPO | Admitting: Neurology

## 2018-06-02 VITALS — BP 116/70 | HR 88 | Ht 73.0 in | Wt 253.0 lb

## 2018-06-02 DIAGNOSIS — G500 Trigeminal neuralgia: Secondary | ICD-10-CM

## 2018-06-02 DIAGNOSIS — E871 Hypo-osmolality and hyponatremia: Secondary | ICD-10-CM | POA: Diagnosis not present

## 2018-06-02 NOTE — Patient Instructions (Addendum)
1.  Continue oxcarbazepine 600mg  twice daily 2.  Follow up in 3 months.  Recheck BMP prior to follow up.

## 2018-06-06 DIAGNOSIS — E782 Mixed hyperlipidemia: Secondary | ICD-10-CM

## 2018-06-27 NOTE — Progress Notes (Signed)
FOLLOW UP  Assessment and Plan:   Hypertension Well controlled with current medications  Monitor blood pressure at home; patient to call if consistently greater than 130/80 Continue DASH diet.   Reminder to go to the ER if any CP, SOB, nausea, dizziness, severe HA, changes vision/speech, left arm numbness and tingling and jaw pain.  Cholesterol Currently near LDL goal with red yeast rice Continue low cholesterol diet and exercise.  Check lipid panel.   Prediabetes Continue diet and exercise.  Perform daily foot/skin check, notify office of any concerning changes.  Check A1C  Obesity with co morbidities Long discussion about weight loss, diet, and exercise Recommended diet heavy in fruits and veggies and low in animal meats, cheeses, and dairy products, appropriate calorie intake Discussed ideal weight for height, goal weight 225lb to begin Patient on phentermine with benefit and no SE, taking drug breaks; continue close follow up.  Will follow up in 3 months  Vitamin D Def Near goal at last visit; continue supplementation for goal of 70-100 Defer Vit D level  Hypogonadism - continue replacement therapy, check testosterone levels as needed  Continue diet and meds as discussed. Further disposition pending results of labs. Discussed med's effects and SE's.   Over 30 minutes of exam, counseling, chart review, and critical decision making was performed.   Future Appointments  Date Time Provider [place]  09/08/2018  8:50 AM Pieter Partridge, DO [facility] None  10/04/2018  4:30 PM [name], MD [facility] None  04/18/2019 10:00 AM [name], MD [facility] None    ----------------------------------------------------------------------------------------------------------------------  HPI 52 y.o. male  presents for 3 month follow up on hypertension, cholesterol, prediabetes, obesity, hypogonadism and vitamin D deficiency.   he is prescribed phentermine for  weight loss.  While on the medication they have lost 16 lbs since last visit. They deny palpitations, anxiety, trouble sleeping, elevated BP.   BMI is Body mass index is 33.38 kg/m., he is working on diet and exercise. Wt Readings from Last 3 Encounters:  06/28/18 253 lb (114.8 kg)  06/02/18 253 lb (114.8 kg)  03/20/18 269 lb 3.2 oz (122.1 kg)   Typical breakfast: couple eggs AM snack: grapes/fruit Typical lunch: Microwave meals (healthy choice, looks at calories and fat content) Typical dinner: Whatever wife cooks, but cutting down on portion, will get a salad Exercise: Going to the gym 3 times a week Water intake: drinks 3-4 bottles of diet soda,   His blood pressure has been controlled at home, today their BP is BP: 120/80  He does workout. He denies chest pain, shortness of breath, dizziness.   He is not on cholesterol medication (on red yeast rice supplement only) and denies myalgias. His LDL/total cholesterol cholesterol is at goal. The cholesterol last visit was:   Lab Results  Component Value Date   CHOL 182 03/20/2018   HDL 40 (L) 03/20/2018   LDLCALC 117 (H) 03/20/2018   TRIG 135 03/20/2018   CHOLHDL 4.6 03/20/2018    He has been working on diet and exercise for prediabetes, and denies foot ulcerations, increased appetite, nausea, paresthesia of the feet, polydipsia, polyuria, visual disturbances, vomiting and weight loss. Last A1C in the office was:  Lab Results  Component Value Date   HGBA1C 5.8 (H) 03/20/2018   Patient is on Vitamin D supplement and approaching goal at recent check:    Lab Results  Component Value Date   VD25OH 62 03/20/2018     He has a history of testosterone deficiency  and is on testosterone replacement, due for shot today, gets at home by wife. He states that the testosterone helps with his energy, libido, muscle mass. Lab Results  Component Value Date   TESTOSTERONE 608 03/20/2018     Current Medications:  Current Outpatient Medications  on File Prior to Visit  Medication Sig  . aspirin 81 MG chewable tablet Chew 1 tablet (81 mg total) by mouth daily.  . Cholecalciferol (VITAMIN D PO) Take 5,000 Units by mouth daily.  . fluticasone (FLONASE) 50 MCG/ACT nasal spray USE ONE SPRAY EACH NOSTRIL TWICE DAILY  . lisinopril (PRINIVIL,ZESTRIL) 40 MG tablet TAKE 1 TABLET BY MOUTH ONCE DAILY  . Magnesium 250 MG TABS Take 250 mg by mouth daily.  . Methylcobalamin (B-12) 5000 MCG TBDP Take by mouth.  . Omega-3 Fatty Acids (FISH OIL) 1000 MG CAPS Take by mouth 4 (four) times daily.  Marland Kitchen oxcarbazepine (TRILEPTAL) 600 MG tablet TAKE 1 TABLET BY MOUTH TWICE DAILY  . phentermine (ADIPEX-P) 37.5 MG tablet Take 1/2 to 1 tablet every morning for dieting & weightloss  . Red Yeast Rice Extract (RED YEAST RICE PO) Take 1,200 mg by mouth 2 (two) times daily.   . sildenafil (VIAGRA) 100 MG tablet 1/2-1 pill as needed 1 hour before intercourse, can get with good RX from [employer]  . SYRINGE-NEEDLE, DISP, 3 ML (B-D 3CC LUER-LOK SYR 21GX1") 21G X 1" 3 ML MISC USE AS DIRECTED  . testosterone cypionate (DEPOTESTOSTERONE CYPIONATE) 200 MG/ML injection INJECT 2 ML INTRAMUSCULARLY EVERY 2 WEEKS (Patient taking differently: INJECT 2 ML INTRAMUSCULARLY EVERY 3 weeks)  . triamcinolone ointment (KENALOG) 0.5 % Apply 1 application topically 2 (two) times daily. Apply to rash 2 to 3 x/day   No current facility-administered medications on file prior to visit.     Allergies:  Allergies  Allergen Reactions  . Toprol Xl [Metoprolol Tartrate]     ED     Medical History:  Past Medical History:  Diagnosis Date  . Allergy   . Arthritis   . Elevated hemoglobin A1c   . Elevated LDL cholesterol level   . Hx of colonic polyp 11/20/2014  . Hypertension   . Hypogonadism male   . Neuromuscular disorder (HCC)    Trigeminar neuralgia  . Sleep apnea    wears CPAP  . Vitamin D deficiency    Family history- Reviewed and unchanged Social history- Reviewed and  unchanged   Review of Systems:  Review of Systems  Constitutional: Negative for malaise/fatigue and weight loss.  HENT: Negative for hearing loss and tinnitus.   Eyes: Negative for blurred vision and double vision.  Respiratory: Negative for cough, shortness of breath and wheezing.   Cardiovascular: Negative for chest pain, palpitations, orthopnea, claudication and leg swelling.  Gastrointestinal: Negative for abdominal pain, blood in stool, constipation, diarrhea, heartburn, melena, nausea and vomiting.  Genitourinary: Negative.   Musculoskeletal: Negative for joint pain and myalgias.  Skin: Negative for rash.  Neurological: Negative for dizziness, tingling, sensory change, weakness and headaches.  Endo/Heme/Allergies: Negative for polydipsia.  Psychiatric/Behavioral: Negative.   All other systems reviewed and are negative.     Physical Exam: BP 120/80   Pulse 80   Temp (!) 97.5 F (36.4 C)   Ht 6\' 1"  (1.854 m)   Wt 253 lb (114.8 kg)   SpO2 97%   BMI 33.38 kg/m  Wt Readings from Last 3 Encounters:  06/28/18 253 lb (114.8 kg)  06/02/18 253 lb (114.8 kg)  03/20/18 269 lb  3.2 oz (122.1 kg)   General Appearance: Well nourished, in no apparent distress. Eyes: PERRLA, EOMs, conjunctiva no swelling or erythema Sinuses: No Frontal/maxillary tenderness ENT/Mouth: Ext aud canals clear, TMs without erythema, bulging. No erythema, swelling, or exudate on post pharynx.  Tonsils not swollen or erythematous. Hearing normal.  Neck: Supple, thyroid normal.  Respiratory: Respiratory effort normal, BS equal bilaterally without rales, rhonchi, wheezing or stridor.  Cardio: RRR with no MRGs. Brisk peripheral pulses without edema.  Abdomen: Soft, + BS.  Non tender, no guarding, rebound, hernias, masses. Lymphatics: Non tender without lymphadenopathy.  Musculoskeletal: Full ROM, 5/5 strength, Normal gait.  Skin: Warm, dry without rashes, lesions, ecchymosis.  Neuro: Cranial nerves intact.  No cerebellar symptoms.  Psych: Awake and oriented X 3, normal affect, Insight and Judgment appropriate.    Izora Ribas, NP 5:05 PM [facility]

## 2018-06-28 ENCOUNTER — Ambulatory Visit: Payer: BC Managed Care – PPO | Admitting: Adult Health

## 2018-06-28 ENCOUNTER — Encounter: Payer: Self-pay | Admitting: Adult Health

## 2018-06-28 VITALS — BP 120/80 | HR 80 | Temp 97.5°F | Ht 73.0 in | Wt 253.0 lb

## 2018-06-28 DIAGNOSIS — E291 Testicular hypofunction: Secondary | ICD-10-CM | POA: Diagnosis not present

## 2018-06-28 DIAGNOSIS — E669 Obesity, unspecified: Secondary | ICD-10-CM

## 2018-06-28 DIAGNOSIS — N182 Chronic kidney disease, stage 2 (mild): Secondary | ICD-10-CM | POA: Diagnosis not present

## 2018-06-28 DIAGNOSIS — I10 Essential (primary) hypertension: Secondary | ICD-10-CM

## 2018-06-28 DIAGNOSIS — R7309 Other abnormal glucose: Secondary | ICD-10-CM | POA: Diagnosis not present

## 2018-06-28 DIAGNOSIS — E782 Mixed hyperlipidemia: Secondary | ICD-10-CM | POA: Diagnosis not present

## 2018-06-28 DIAGNOSIS — E559 Vitamin D deficiency, unspecified: Secondary | ICD-10-CM

## 2018-06-28 DIAGNOSIS — Z79899 Other long term (current) drug therapy: Secondary | ICD-10-CM

## 2018-06-28 DIAGNOSIS — Z23 Encounter for immunization: Secondary | ICD-10-CM | POA: Diagnosis not present

## 2018-06-28 MED ORDER — DILTIAZEM HCL 120 MG PO TABS: 120.0000 mg | ORAL_TABLET | Freq: Every day | ORAL | 1 refills | Status: DC

## 2018-06-28 NOTE — Patient Instructions (Signed)
Goals    . LDL CALC < 100    . Weight (lb) < 225 lb (102.1 kg)       Know what a healthy weight is for you (roughly BMI <25) and aim to maintain this  Aim for 7+ servings of fruits and vegetables daily  65-80+ fluid ounces of water or unsweet tea for healthy kidneys  Limit to max 1 drink of alcohol per day; avoid smoking/tobacco  Limit animal fats in diet for cholesterol and heart health - choose grass fed whenever available  Avoid highly processed foods, and foods high in saturated/trans fats  Aim for low stress - take time to unwind and care for your mental health  Aim for 150 min of moderate intensity exercise weekly for heart health, and weights twice weekly for bone health  Aim for 7-9 hours of sleep daily    Drink 1/2 your body weight in fluid ounces of water daily; drink a tall glass of water 30 min before meals  Don't eat until you're stuffed- listen to your stomach and eat until you are 80% full   Try eating off of a salad plate; wait 10 min after finishing before going back for seconds  Start by eating the vegetables on your plate; aim for 50% of your meals to be fruits or vegetables  Then eat your protein - lean meats (grass fed if possible), fish, beans, nuts in moderation  Eat your carbs/starch last ONLY if you still are hungry. If you can, stop before finishing it all  Avoid sugar and flour - the closer it looks to it's original form in nature, typically the better it is for you  Splurge in moderation - "assign" days when you get to splurge and have the "bad stuff" - I like to follow a 80% - 20% plan- "good" choices 80 % of the time, "bad" choices in moderation 20% of the time  Simple equation is: Calories out > calories in = weight loss - even if you eat the bad stuff, if you limit portions, you will still lose weight       When it comes to diets, agreement about the perfect plan isn't easy to find, even among the experts. Experts at the [employer] developed an idea known as the Healthy Eating Plate. Just imagine a plate divided into logical, healthy portions.  The emphasis is on diet quality:  Load up on vegetables and fruits - one-half of your plate: Aim for color and variety, and remember that potatoes don't count.  Go for whole grains - one-quarter of your plate: Whole wheat, barley, wheat berries, quinoa, oats, brown rice, and foods made with them. If you want pasta, go with whole wheat pasta.  Protein power - one-quarter of your plate: Fish, chicken, beans, and nuts are all healthy, versatile protein sources. Limit red meat.  The diet, however, does go beyond the plate, offering a few other suggestions.  Use healthy plant oils, such as olive, canola, soy, corn, sunflower and peanut. Check the labels, and avoid partially hydrogenated oil, which have unhealthy trans fats.  If you're thirsty, drink water. Coffee and tea are good in moderation, but skip sugary drinks and limit milk and dairy products to one or two daily servings.  The type of carbohydrate in the diet is more important than the amount. Some sources of carbohydrates, such as vegetables, fruits, whole grains, and beans-are healthier than others.  Finally, stay active.

## 2018-06-29 LAB — CBC WITH DIFFERENTIAL/PLATELET
BASOS ABS: 51 {cells}/uL (ref 0–200)
Basophils Relative: 0.9 %
EOS ABS: 137 {cells}/uL (ref 15–500)
EOS PCT: 2.4 %
HCT: 44.3 % (ref 38.5–50.0)
Hemoglobin: 15.3 g/dL (ref 13.2–17.1)
Lymphs Abs: 1454 cells/uL (ref 850–3900)
MCH: 29.7 pg (ref 27.0–33.0)
MCHC: 34.5 g/dL (ref 32.0–36.0)
MCV: 85.9 fL (ref 80.0–100.0)
MONOS PCT: 13.1 %
MPV: 8.9 fL (ref 7.5–12.5)
NEUTROS ABS: 3312 {cells}/uL (ref 1500–7800)
Neutrophils Relative %: 58.1 %
PLATELETS: 318 10*3/uL (ref 140–400)
RBC: 5.16 10*6/uL (ref 4.20–5.80)
RDW: 13.2 % (ref 11.0–15.0)
TOTAL LYMPHOCYTE: 25.5 %
WBC mixed population: 747 cells/uL (ref 200–950)
WBC: 5.7 10*3/uL (ref 3.8–10.8)

## 2018-06-29 LAB — LIPID PANEL
CHOL/HDL RATIO: 3.4 (calc) (ref <5.0)
CHOLESTEROL: 161 mg/dL (ref <200)
HDL: 48 mg/dL (ref >40)
LDL Cholesterol (Calc): 94 mg/dL (calc)
Non-HDL Cholesterol (Calc): 113 mg/dL (calc) (ref <130)
Triglycerides: 97 mg/dL (ref <150)

## 2018-06-29 LAB — COMPLETE METABOLIC PANEL WITH GFR
AG Ratio: 1.7 (calc) (ref 1.0–2.5)
ALKALINE PHOSPHATASE (APISO): 66 U/L (ref 40–115)
ALT: 29 U/L (ref 9–46)
AST: 36 U/L — AB (ref 10–35)
Albumin: 4.7 g/dL (ref 3.6–5.1)
BILIRUBIN TOTAL: 0.6 mg/dL (ref 0.2–1.2)
BUN: 10 mg/dL (ref 7–25)
CHLORIDE: 94 mmol/L — AB (ref 98–110)
CO2: 29 mmol/L (ref 20–32)
Calcium: 9.6 mg/dL (ref 8.6–10.3)
Creat: 1.14 mg/dL (ref 0.70–1.33)
GFR, Est African American: 84 mL/min/{1.73_m2} (ref >=60)
GFR, Est Non African American: 73 mL/min/{1.73_m2} (ref >=60)
GLUCOSE: 96 mg/dL (ref 65–99)
Globulin: 2.8 g/dL (calc) (ref 1.9–3.7)
Potassium: 5 mmol/L (ref 3.5–5.3)
Sodium: 130 mmol/L — ABNORMAL LOW (ref 135–146)
Total Protein: 7.5 g/dL (ref 6.1–8.1)

## 2018-06-29 LAB — HEMOGLOBIN A1C
HEMOGLOBIN A1C: 5.8 %{Hb} — AB (ref <5.7)
Mean Plasma Glucose: 120 (calc)
eAG (mmol/L): 6.6 (calc)

## 2018-06-29 LAB — TSH: TSH: 1.98 mIU/L (ref 0.40–4.50)

## 2018-07-28 ENCOUNTER — Other Ambulatory Visit: Payer: Self-pay | Admitting: Internal Medicine

## 2018-08-31 ENCOUNTER — Other Ambulatory Visit: Payer: Self-pay | Admitting: *Deleted

## 2018-08-31 ENCOUNTER — Other Ambulatory Visit (INDEPENDENT_AMBULATORY_CARE_PROVIDER_SITE_OTHER): Payer: BC Managed Care – PPO

## 2018-08-31 ENCOUNTER — Telehealth: Payer: Self-pay | Admitting: Neurology

## 2018-08-31 DIAGNOSIS — E871 Hypo-osmolality and hyponatremia: Secondary | ICD-10-CM

## 2018-08-31 LAB — BASIC METABOLIC PANEL
BUN: 9 mg/dL (ref 6–23)
CO2: 28 mEq/L (ref 19–32)
Calcium: 9 mg/dL (ref 8.4–10.5)
Chloride: 96 mEq/L (ref 96–112)
Creatinine, Ser: 1.19 mg/dL (ref 0.40–1.50)
GFR: 67.61 mL/min (ref >60.00)
Glucose, Bld: 103 mg/dL — ABNORMAL HIGH (ref 70–99)
POTASSIUM: 3.8 meq/L (ref 3.5–5.1)
SODIUM: 131 meq/L — AB (ref 135–145)

## 2018-08-31 NOTE — Telephone Encounter (Signed)
Patient will be coming in for labs today. He wanted to make sure his Orders were in. Thanks

## 2018-09-01 ENCOUNTER — Other Ambulatory Visit: Payer: Self-pay | Admitting: Internal Medicine

## 2018-09-01 MED ORDER — VALACYCLOVIR HCL 1 G PO TABS: ORAL_TABLET | ORAL | 3 refills | Status: DC

## 2018-09-07 NOTE — Progress Notes (Signed)
NEUROLOGY FOLLOW UP OFFICE NOTE  ZEBULAN HINSHAW [id number]  HISTORY OF PRESENT ILLNESS: Donald Schwartz. Donald Schwartz is a 52 year old right-handed male with hypertension, CKD stage II, hyperlipidemia and OSA who follows up for right-sided trigeminal neuralgia.  UPDATE: Current medication: Oxcarbazepine 600 mg twice daily. In early November he started getting twinges but they decreased by the end of the month.  Overall, he has been doing well this past month despite cold weather.    Na from 08/31/18 was stable at 131.  HISTORY:  Beginning in January 2016, he started having pain involving the right side of the face.It started in the right eye, then involving the right top of head, right side of nose and right upper teeth.It is a paroxysmalshooting painlasting a few seconds at a time and occurring several times daily.It is triggered by lightly touching the cheek, eating or the feeling of water running over his head.Sometimes there is a slight tingling but usually not.Nothing relieves it, it aborts spontaneously.There is no numbness or facial weakness.He was evaluated by the eye doctor and dentist, with normal exams.  Prior medications:Lyrica.He previously took gabapentin for post-traumatic headaches, but had side effects.  His insurance would not cover baclofen.  PAST MEDICAL HISTORY: Past Medical History:  Diagnosis Date  . Allergy   . Arthritis   . Elevated hemoglobin A1c   . Elevated LDL cholesterol level   . Hx of colonic polyp 11/20/2014  . Hypertension   . Hypogonadism male   . Neuromuscular disorder (HCC)    Trigeminar neuralgia  . Sleep apnea    wears CPAP  . Vitamin D deficiency     MEDICATIONS: Current Outpatient Medications on File Prior to Visit  Medication Sig Dispense Refill  . aspirin 81 MG chewable tablet Chew 1 tablet (81 mg total) by mouth daily.    . Cholecalciferol (VITAMIN D PO) Take 5,000 Units by mouth daily.    Marland Kitchen diltiazem (CARDIZEM) 120 MG  tablet Take 1 tablet (120 mg total) by mouth daily. 90 tablet 1  . fluticasone (FLONASE) 50 MCG/ACT nasal spray USE ONE SPRAY EACH NOSTRIL TWICE DAILY 16 g 3  . lisinopril (PRINIVIL,ZESTRIL) 40 MG tablet TAKE 1 TABLET BY MOUTH ONCE DAILY 90 tablet 1  . Magnesium 250 MG TABS Take 250 mg by mouth daily.    . Methylcobalamin (B-12) 5000 MCG TBDP Take by mouth.    . Omega-3 Fatty Acids (FISH OIL) 1000 MG CAPS Take by mouth 4 (four) times daily.    Marland Kitchen oxcarbazepine (TRILEPTAL) 600 MG tablet TAKE 1 TABLET BY MOUTH TWICE DAILY 60 tablet 5  . phentermine (ADIPEX-P) 37.5 MG tablet Take 1/2 to 1 tablet every morning for dieting & weightloss 30 tablet 5  . Red Yeast Rice Extract (RED YEAST RICE PO) Take 1,200 mg by mouth 2 (two) times daily.     . sildenafil (VIAGRA) 100 MG tablet 1/2-1 pill as needed 1 hour before intercourse, can get with good RX from [employer] 10 tablet 2  . SYRINGE-NEEDLE, DISP, 3 ML (B-D 3CC LUER-LOK SYR 21GX1") 21G X 1" 3 ML MISC USE AS DIRECTED 100 each 0  . testosterone cypionate (DEPOTESTOSTERONE CYPIONATE) 200 MG/ML injection INJECT 2 ML INTRAMUSCULARLY EVERY 2 WEEKS (Patient taking differently: INJECT 2 ML INTRAMUSCULARLY EVERY 3 weeks) 10 mL 3  . triamcinolone ointment (KENALOG) 0.5 % Apply 1 application topically 2 (two) times daily. Apply to rash 2 to 3 x/day 60 g 3  . valACYclovir (VALTREX) 1000 MG tablet Take 1  tablet daily prophylaxis for fever blisters 90 tablet 3   No current facility-administered medications on file prior to visit.     ALLERGIES: Allergies  Allergen Reactions  . Toprol Xl [Metoprolol Tartrate]     ED    FAMILY HISTORY: Family History  Problem Relation Age of Onset  . Colon cancer Maternal Uncle   . Prostate cancer Maternal Uncle   . Colon cancer Paternal Uncle   . Colon cancer Maternal Grandfather   . Colon cancer Paternal Grandfather   . Prostate cancer Father   . Stomach cancer Neg Hx   . Esophageal cancer Neg Hx     SOCIAL  HISTORY: Social History   Socioeconomic History  . Marital status: Married    Spouse name: Not on file  . Number of children: Not on file  . Years of education: Not on file  . Highest education level: Not on file  Occupational History  . Not on file  Social Needs  . Financial resource strain: Not on file  . Food insecurity:    Worry: Not on file    Inability: Not on file  . Transportation needs:    Medical: Not on file    Non-medical: Not on file  Tobacco Use  . Smoking status: Former Smoker    Last attempt to quit: 09/20/1989    Years since quitting: 28.9  . Smokeless tobacco: Never Used  Substance and Sexual Activity  . Alcohol use: No    Alcohol/week: 0.0 standard drinks  . Drug use: No  . Sexual activity: Yes    Partners: Female  Lifestyle  . Physical activity:    Days per week: Not on file    Minutes per session: Not on file  . Stress: Not on file  Relationships  . Social connections:    Talks on phone: Not on file    Gets together: Not on file    Attends religious service: Not on file    Active member of club or organization: Not on file    Attends meetings of clubs or organizations: Not on file    Relationship status: Not on file  . Intimate partner violence:    Fear of current or ex partner: Not on file    Emotionally abused: Not on file    Physically abused: Not on file    Forced sexual activity: Not on file  Other Topics Concern  . Not on file  Social History Narrative  . Not on file    REVIEW OF SYSTEMS: Constitutional: No fevers, chills, or sweats, no generalized fatigue, change in appetite Eyes: No visual changes, double vision, eye pain Ear, nose and throat: No hearing loss, ear pain, nasal congestion, sore throat Cardiovascular: No chest pain, palpitations Respiratory:  No shortness of breath at rest or with exertion, wheezes GastrointestinaI: No nausea, vomiting, diarrhea, abdominal pain, fecal incontinence Genitourinary:  No dysuria, urinary  retention or frequency Musculoskeletal:  No neck pain, back pain Integumentary: No rash, pruritus, skin lesions Neurological: as above Psychiatric: No depression, insomnia, anxiety Endocrine: No palpitations, fatigue, diaphoresis, mood swings, change in appetite, change in weight, increased thirst Hematologic/Lymphatic:  No purpura, petechiae. Allergic/Immunologic: no itchy/runny eyes, nasal congestion, recent allergic reactions, rashes  PHYSICAL EXAM: Blood pressure 138/74, pulse 75, height 6\' 1"  (1.854 m), weight 252 lb (114.3 kg), SpO2 99 %. General: No acute distress.  Patient appears well-groomed.   Head:  Normocephalic/atraumatic Eyes:  Fundi examined but not visualized Neck: supple, no paraspinal tenderness, full  range of motion Heart:  Regular rate and rhythm Lungs:  Clear to auscultation bilaterally Back: No paraspinal tenderness Neurological Exam: alert and oriented to person, place, and time. Attention span and concentration intact, recent and remote memory intact, fund of knowledge intact.  Speech fluent and not dysarthric, language intact.  CN II-XII intact. Bulk and tone normal, muscle strength 5/5 throughout.  Sensation to light touch  intact.  Deep tendon reflexes 2+ throughout.  Finger to nose testing intact.  Gait normal, Romberg negative  IMPRESSION: Right-sided trigeminal neuralgia  PLAN: 1.  Continue oxcarbazepine 600mg  twice daily for now. 2.  If pain increases during the winter, we can add baclofen 5mg  three times daily 3.  Follow up in 3 to 4 months.  15 minutes spent face to face with patient, over 50% spent discussing management.  Metta Clines, DO  CC: [name], MD

## 2018-09-08 ENCOUNTER — Ambulatory Visit: Payer: BC Managed Care – PPO | Admitting: Neurology

## 2018-09-08 ENCOUNTER — Encounter: Payer: Self-pay | Admitting: Neurology

## 2018-09-08 VITALS — BP 138/74 | HR 75 | Ht 73.0 in | Wt 252.0 lb

## 2018-09-08 DIAGNOSIS — G500 Trigeminal neuralgia: Secondary | ICD-10-CM

## 2018-09-08 NOTE — Patient Instructions (Signed)
1.  Continue oxcarbazepine 600mg  twice daily 2.  If symptoms worsen during winter, we will add a second medication 3.  Follow up in 3 to 4 months.

## 2018-10-04 ENCOUNTER — Ambulatory Visit (INDEPENDENT_AMBULATORY_CARE_PROVIDER_SITE_OTHER): Payer: BC Managed Care – PPO | Admitting: Internal Medicine

## 2018-10-04 ENCOUNTER — Encounter: Payer: Self-pay | Admitting: Internal Medicine

## 2018-10-04 VITALS — BP 128/76 | HR 72 | Temp 97.5°F | Resp 16 | Ht 73.0 in | Wt 259.0 lb

## 2018-10-04 DIAGNOSIS — E782 Mixed hyperlipidemia: Secondary | ICD-10-CM

## 2018-10-04 DIAGNOSIS — E291 Testicular hypofunction: Secondary | ICD-10-CM

## 2018-10-04 DIAGNOSIS — I10 Essential (primary) hypertension: Secondary | ICD-10-CM

## 2018-10-04 DIAGNOSIS — R7309 Other abnormal glucose: Secondary | ICD-10-CM | POA: Diagnosis not present

## 2018-10-04 DIAGNOSIS — Z79899 Other long term (current) drug therapy: Secondary | ICD-10-CM

## 2018-10-04 DIAGNOSIS — E559 Vitamin D deficiency, unspecified: Secondary | ICD-10-CM

## 2018-10-04 NOTE — Progress Notes (Signed)
This very nice 53 y.o. MWM presents for 6 month follow up with HTN, HLD, Pre-Diabetes and Vitamin D Deficiency. Patient is on CPAP for OSA with improved Sleep Hygiene / Restorative Sleep.      Patient is followed by Dr Tomi Likens & is on Lyrica for Rt. Trigeminal Neuralgia.      Patient is treated for HTN (2002) & BP has been controlled at home. Today's BP is at goal - 128/76. Patient has had no complaints of any cardiac type chest pain, palpitations, dyspnea / orthopnea / PND, dizziness, claudication, or dependent edema.     Hyperlipidemia is controlled with diet & meds. Patient denies myalgias or other med SE's. Last Lipids were  Lab Results  Component Value Date   CHOL 161 06/28/2018   HDL 48 06/28/2018   LDLCALC 94 06/28/2018   TRIG 97 06/28/2018   CHOLHDL 3.4 06/28/2018      Also, the patient has history of Morbid Obesity (BMI 34+) and PreDiabetes (A1c 6.3% / 2008)  and has had no symptoms of reactive hypoglycemia, diabetic polys, paresthesias or visual blurring.  Last A1c was not at goal: Lab Results  Component Value Date   HGBA1C 5.8 (H) 06/28/2018      Patient has hx/po Testosterone Deficiency ("241" / 2011) and is on Replacement with improved sense of well being.      Further, the patient also has history of Vitamin D Deficiency ("31" / 2008)  and supplements vitamin D without any suspected side-effects. Last vitamin D was at goal: Lab Results  Component Value Date   VD25OH 6 03/20/2018   Current Outpatient Medications on File Prior to Visit  Medication Sig  . aspirin 81 MG chewable tablet Chew 1 tablet (81 mg total) by mouth daily.  . Cholecalciferol (VITAMIN D PO) Take 5,000 Units by mouth daily.  Marland Kitchen diltiazem (CARDIZEM) 120 MG tablet Take 1 tablet (120 mg total) by mouth daily.  . fluticasone (FLONASE) 50 MCG/ACT nasal spray USE ONE SPRAY EACH NOSTRIL TWICE DAILY  . lisinopril (PRINIVIL,ZESTRIL) 40 MG tablet TAKE 1 TABLET BY MOUTH ONCE DAILY  . Magnesium 250 MG TABS  Take 250 mg by mouth daily.  . Methylcobalamin (B-12) 5000 MCG TBDP Take by mouth.  . Omega-3 Fatty Acids (FISH OIL) 1000 MG CAPS Take by mouth 4 (four) times daily.  Marland Kitchen oxcarbazepine (TRILEPTAL) 600 MG tablet TAKE 1 TABLET BY MOUTH TWICE DAILY  . phentermine (ADIPEX-P) 37.5 MG tablet Take 1/2 to 1 tablet every morning for dieting & weightloss  . Red Yeast Rice Extract (RED YEAST RICE PO) Take 1,200 mg by mouth 2 (two) times daily.   . sildenafil (VIAGRA) 100 MG tablet 1/2-1 pill as needed 1 hour before intercourse, can get with good RX from [employer]  . SYRINGE-NEEDLE, DISP, 3 ML (B-D 3CC LUER-LOK SYR 21GX1") 21G X 1" 3 ML MISC USE AS DIRECTED  . testosterone cypionate (DEPOTESTOSTERONE CYPIONATE) 200 MG/ML injection INJECT 2 ML INTRAMUSCULARLY EVERY 2 WEEKS (Patient taking differently: INJECT 2 ML INTRAMUSCULARLY EVERY 3 weeks)  . triamcinolone ointment (KENALOG) 0.5 % Apply 1 application topically 2 (two) times daily. Apply to rash 2 to 3 x/day  . valACYclovir (VALTREX) 1000 MG tablet Take 1 tablet daily prophylaxis for fever blisters   No current facility-administered medications on file prior to visit.    Allergies  Allergen Reactions  . Toprol Xl [Metoprolol Tartrate]     ED   PMHx:   Past Medical History:  Diagnosis Date  . Allergy   . Arthritis   . Elevated hemoglobin A1c   . Elevated LDL cholesterol level   . Hx of colonic polyp 11/20/2014  . Hypertension   . Hypogonadism male   . Neuromuscular disorder (HCC)    Trigeminar neuralgia  . Sleep apnea    wears CPAP  . Vitamin D deficiency    Immunization History  Administered Date(s) Administered  . Influenza Inj Mdck Quad With Preservative 06/02/2017, 06/28/2018  . Influenza Split 07/10/2014  . Influenza-Unspecified 07/19/2016  . PPD Test 01/07/2014, 03/20/2018  . Pneumococcal-Unspecified 09/20/2009  . Td 09/20/2005  . Tdap 02/03/2016   Past Surgical History:  Procedure Laterality Date  . c5-6 fusion     2012    . COLONOSCOPY    . HIP FRACTURE SURGERY Right 1986   avulsion/chip fracture ?stress fracture   FHx:    Reviewed / unchanged  SHx:    Reviewed / unchanged   Systems Review:  Constitutional: Denies fever, chills, wt changes, headaches, insomnia, fatigue, night sweats, change in appetite. Eyes: Denies redness, blurred vision, diplopia, discharge, itchy, watery eyes.  ENT: Denies discharge, congestion, post nasal drip, epistaxis, sore throat, earache, hearing loss, dental pain, tinnitus, vertigo, sinus pain, snoring.  CV: Denies chest pain, palpitations, irregular heartbeat, syncope, dyspnea, diaphoresis, orthopnea, PND, claudication or edema. Respiratory: denies cough, dyspnea, DOE, pleurisy, hoarseness, laryngitis, wheezing.  Gastrointestinal: Denies dysphagia, odynophagia, heartburn, reflux, water brash, abdominal pain or cramps, nausea, vomiting, bloating, diarrhea, constipation, hematemesis, melena, hematochezia  or hemorrhoids. Genitourinary: Denies dysuria, frequency, urgency, nocturia, hesitancy, discharge, hematuria or flank pain. Musculoskeletal: Denies arthralgias, myalgias, stiffness, jt. swelling, pain, limping or strain/sprain.  Skin: Denies pruritus, rash, hives, warts, acne, eczema or change in skin lesion(s). Neuro: No weakness, tremor, incoordination, spasms, paresthesia or pain. Psychiatric: Denies confusion, memory loss or sensory loss. Endo: Denies change in weight, skin or hair change.  Heme/Lymph: No excessive bleeding, bruising or enlarged lymph nodes.  Physical Exam  BP 128/76   Pulse 72   Temp (!) 97.5 F (36.4 C)   Resp 16   Ht 6\' 1"  (1.854 m)   Wt 259 lb (117.5 kg)   BMI 34.17 kg/m   Appears  well nourished, well groomed  and in no distress.  Eyes: PERRLA, EOMs, conjunctiva no swelling or erythema. Sinuses: No frontal/maxillary tenderness ENT/Mouth: EAC's clear, TM's nl w/o erythema, bulging. Nares clear w/o erythema, swelling, exudates. Oropharynx  clear without erythema or exudates. Oral hygiene is good. Tongue normal, non obstructing. Hearing intact.  Neck: Supple. Thyroid not palpable. Car 2+/2+ without bruits, nodes or JVD. Chest: Respirations nl with BS clear & equal w/o rales, rhonchi, wheezing or stridor.  Cor: Heart sounds normal w/ regular rate and rhythm without sig. murmurs, gallops, clicks or rubs. Peripheral pulses normal and equal  without edema.  Abdomen: Soft & bowel sounds normal. Non-tender w/o guarding, rebound, hernias, masses or organomegaly.  Lymphatics: Unremarkable.  Musculoskeletal: Full ROM all peripheral extremities, joint stability, 5/5 strength and normal gait.  Skin: Warm, dry without exposed rashes, lesions or ecchymosis apparent.  Neuro: Cranial nerves intact, reflexes equal bilaterally. Sensory-motor testing grossly intact. Tendon reflexes grossly intact.  Pysch: Alert & oriented x 3.  Insight and judgement nl & appropriate. No ideations.  Assessment and Plan:  1. Essential hypertension  - Continue medication, monitor blood pressure at home.  - Continue DASH diet.  Reminder to go to the ER if any CP,  SOB, nausea, dizziness, severe HA, changes  vision/speech.  - CBC with Differential/Platelet - COMPLETE METABOLIC PANEL WITH GFR - Magnesium - TSH  2. Hyperlipidemia, mixed  - Continue diet/meds, exercise,& lifestyle modifications.  - Continue monitor periodic cholesterol/liver & renal functions   - Lipid panel - TSH  3. PreDiabetes  - Continue diet, exercise  - Lifestyle modifications.  - Monitor appropriate labs.  - Hemoglobin A1c - Insulin, random  4. Vitamin D deficiency  - Continue supplementation.    - VITAMIN D 25 Hydroxyl  5. Testosterone Deficiency  - Testosterone  6. Medication management  - CBC with Differential/Platelet - COMPLETE METABOLIC PANEL WITH GFR - Magnesium - Lipid panel - TSH - Hemoglobin A1c - Insulin, random - VITAMIN D 25 Hydroxyl -  Testosterone      Discussed  regular exercise, BP monitoring, weight control to achieve/maintain BMI less than 25 and discussed med and SE's. Recommended labs to assess and monitor clinical status with further disposition pending results of labs. Over 30 minutes of exam, counseling, chart review was performed.

## 2018-10-04 NOTE — Patient Instructions (Signed)

## 2018-10-05 LAB — COMPLETE METABOLIC PANEL WITH GFR
AG Ratio: 1.8 (calc) (ref 1.0–2.5)
ALKALINE PHOSPHATASE (APISO): 68 U/L (ref 40–115)
ALT: 30 U/L (ref 9–46)
AST: 40 U/L — AB (ref 10–35)
Albumin: 4.6 g/dL (ref 3.6–5.1)
BUN: 11 mg/dL (ref 7–25)
CO2: 26 mmol/L (ref 20–32)
Calcium: 9.4 mg/dL (ref 8.6–10.3)
Chloride: 97 mmol/L — ABNORMAL LOW (ref 98–110)
Creat: 1.21 mg/dL (ref 0.70–1.33)
GFR, EST NON AFRICAN AMERICAN: 67 mL/min/{1.73_m2} (ref >=60)
GFR, Est African American: 78 mL/min/{1.73_m2} (ref >=60)
GLOBULIN: 2.5 g/dL (ref 1.9–3.7)
GLUCOSE: 70 mg/dL (ref 65–99)
Potassium: 4 mmol/L (ref 3.5–5.3)
SODIUM: 132 mmol/L — AB (ref 135–146)
Total Bilirubin: 0.4 mg/dL (ref 0.2–1.2)
Total Protein: 7.1 g/dL (ref 6.1–8.1)

## 2018-10-05 LAB — LIPID PANEL
CHOLESTEROL: 153 mg/dL (ref <200)
HDL: 50 mg/dL (ref >40)
LDL CHOLESTEROL (CALC): 80 mg/dL
Non-HDL Cholesterol (Calc): 103 mg/dL (calc) (ref <130)
Total CHOL/HDL Ratio: 3.1 (calc) (ref <5.0)
Triglycerides: 126 mg/dL (ref <150)

## 2018-10-05 LAB — HEMOGLOBIN A1C
Hgb A1c MFr Bld: 5.9 % of total Hgb — ABNORMAL HIGH (ref <5.7)
MEAN PLASMA GLUCOSE: 123 (calc)
eAG (mmol/L): 6.8 (calc)

## 2018-10-05 LAB — CBC WITH DIFFERENTIAL/PLATELET
Absolute Monocytes: 1062 cells/uL — ABNORMAL HIGH (ref 200–950)
Basophils Absolute: 59 cells/uL (ref 0–200)
Basophils Relative: 1 %
Eosinophils Absolute: 189 cells/uL (ref 15–500)
Eosinophils Relative: 3.2 %
HCT: 40 % (ref 38.5–50.0)
Hemoglobin: 13.7 g/dL (ref 13.2–17.1)
LYMPHS ABS: 1723 {cells}/uL (ref 850–3900)
MCH: 29.7 pg (ref 27.0–33.0)
MCHC: 34.3 g/dL (ref 32.0–36.0)
MCV: 86.6 fL (ref 80.0–100.0)
MONOS PCT: 18 %
MPV: 8.9 fL (ref 7.5–12.5)
NEUTROS ABS: 2867 {cells}/uL (ref 1500–7800)
Neutrophils Relative %: 48.6 %
PLATELETS: 312 10*3/uL (ref 140–400)
RBC: 4.62 10*6/uL (ref 4.20–5.80)
RDW: 13.1 % (ref 11.0–15.0)
Total Lymphocyte: 29.2 %
WBC: 5.9 10*3/uL (ref 3.8–10.8)

## 2018-10-05 LAB — TESTOSTERONE: Testosterone: 354 ng/dL (ref 250–827)

## 2018-10-05 LAB — VITAMIN D 25 HYDROXY (VIT D DEFICIENCY, FRACTURES): VIT D 25 HYDROXY: 72 ng/mL (ref 30–100)

## 2018-10-05 LAB — INSULIN, RANDOM: Insulin: 23.3 u[IU]/mL — ABNORMAL HIGH (ref 2.0–19.6)

## 2018-10-05 LAB — TSH: TSH: 2.31 m[IU]/L (ref 0.40–4.50)

## 2018-10-05 LAB — MAGNESIUM: MAGNESIUM: 2 mg/dL (ref 1.5–2.5)

## 2018-10-07 ENCOUNTER — Other Ambulatory Visit: Payer: Self-pay | Admitting: Internal Medicine

## 2018-10-07 DIAGNOSIS — E66812 Obesity, class 2: Secondary | ICD-10-CM

## 2018-10-07 DIAGNOSIS — Z6835 Body mass index (BMI) 35.0-35.9, adult: Principal | ICD-10-CM

## 2018-10-08 ENCOUNTER — Encounter: Payer: Self-pay | Admitting: Internal Medicine

## 2018-10-16 ENCOUNTER — Other Ambulatory Visit: Payer: Self-pay | Admitting: Neurology

## 2019-01-10 NOTE — Progress Notes (Signed)
FOLLOW UP  Assessment and Plan:   Hypertension Well controlled with current medications  Monitor blood pressure at home; patient to call if consistently greater than 130/80 Continue DASH diet.   Reminder to go to the ER if any CP, SOB, nausea, dizziness, severe HA, changes vision/speech, left arm numbness and tingling and jaw pain.  Cholesterol Currently at LDL goal with red yeast rice Continue low cholesterol diet and exercise.  Check lipid panel.   Prediabetes Continue diet and exercise.  Perform daily foot/skin check, notify office of any concerning changes.  Check A1C  Obesity with co morbidities Long discussion about weight loss, diet, and exercise Recommended diet heavy in fruits and veggies and low in animal meats, cheeses, and dairy products, appropriate calorie intake Discussed ideal weight for height, goal weight 225lb to begin Patient on phentermine with benefit and no SE, taking drug breaks; continue close follow up. Recommended he try HIIT instead of steady state cardio, increase weights/resistance activity Continue working on portion sizes  Will follow up in 3 months  Vitamin D Def Near goal at last visit; continue supplementation for goal of 70-100 Defer Vit D level  Hypogonadism check testosterone levels,per patient request, would like to restart injections  Continue diet and meds as discussed. Further disposition pending results of labs. Discussed med's effects and SE's.   Over 30 minutes of exam, counseling, chart review, and critical decision making was performed.   Future Appointments  Date Time Provider [place]  01/15/2019 11:00 AM Pieter Partridge, DO [facility] None  04/18/2019 10:00 AM [name], MD [facility] None    ----------------------------------------------------------------------------------------------------------------------  HPI 53 y.o. male  presents for 3 month follow up on hypertension, cholesterol, prediabetes,  obesity, hypogonadism and vitamin D deficiency.   he is prescribed phentermine for weight loss.  While on the medication they have lost 3 lbs since last visit. They deny palpitations, anxiety, trouble sleeping, elevated BP.   He is on oxcarbazepine per neurology for trigeminal neuralgia; reports recently well controlled; he has mild persistent hyponatremia attributed to this which has been stable.   BMI is Body mass index is 33.91 kg/m., he is working on diet and exercise, walking regularly around the neighborhood, will resume [employer] once covid 19 has  Wt Readings from Last 3 Encounters:  01/11/19 257 lb (116.6 kg)  10/04/18 259 lb (117.5 kg)  09/08/18 252 lb (114.3 kg)   Typical breakfast: couple eggs AM snack: grapes/fruit Typical lunch: Microwave meals (healthy choice, looks at calories and fat content) Typical dinner: Whatever wife cooks, but cutting down on portion, will get a salad Exercise: was going to the gym 3 times a week, now walking Water intake: drinks 3-4 bottles of diet soda,   His blood pressure has been controlled at home, today their BP is BP: 124/78  He does workout. He denies chest pain, shortness of breath, dizziness.   He is not on cholesterol medication (on red yeast rice supplement only) and denies myalgias. His LDL/total cholesterol cholesterol is at goal. The cholesterol last visit was:   Lab Results  Component Value Date   CHOL 153 10/04/2018   HDL 50 10/04/2018   LDLCALC 80 10/04/2018   TRIG 126 10/04/2018   CHOLHDL 3.1 10/04/2018    He has been working on diet and exercise for prediabetes, and denies foot ulcerations, increased appetite, nausea, paresthesia of the feet, polydipsia, polyuria, visual disturbances, vomiting and weight loss. Last A1C in the office was:  Lab Results  Component  Value Date   HGBA1C 5.9 (H) 10/04/2018   Patient is on Vitamin D supplement and at goal at recent check:    Lab Results  Component Value Date   VD25OH 72  10/04/2018     He has a history of testosterone deficiency and was on testosterone replacement, was advised by insurance needs rechecked for renewal/approval. He states that the testosterone helps with his energy, libido, muscle mass. Lab Results  Component Value Date   TESTOSTERONE 354 10/04/2018     Current Medications:  Current Outpatient Medications on File Prior to Visit  Medication Sig  . aspirin 81 MG chewable tablet Chew 1 tablet (81 mg total) by mouth daily.  . Cholecalciferol (VITAMIN D PO) Take 10,000 Units by mouth daily.   Marland Kitchen diltiazem (CARDIZEM) 120 MG tablet Take 1 tablet (120 mg total) by mouth daily.  . fluticasone (FLONASE) 50 MCG/ACT nasal spray USE ONE SPRAY EACH NOSTRIL TWICE DAILY  . Lecithin 1200 MG CAPS Take by mouth daily.  Marland Kitchen lisinopril (PRINIVIL,ZESTRIL) 40 MG tablet TAKE 1 TABLET BY MOUTH ONCE DAILY  . Magnesium 250 MG TABS Take 500 mg by mouth 2 (two) times a day.   . Multiple Vitamins-Minerals (MULTIVITAMIN MEN PO) Take by mouth daily.  . Omega-3 Fatty Acids (FISH OIL) 1000 MG CAPS Take 1,200 mg by mouth. Take two tablets in the morning and two tablets in the evening  . oxcarbazepine (TRILEPTAL) 600 MG tablet TAKE 1 TABLET BY MOUTH TWICE DAILY  . phentermine (ADIPEX-P) 37.5 MG tablet Take 1 tablet every morning for Dieting & Weight Loss  . Red Yeast Rice Extract (RED YEAST RICE PO) Take 600 mg by mouth 2 (two) times daily.   . sildenafil (VIAGRA) 100 MG tablet 1/2-1 pill as needed 1 hour before intercourse, can get with good RX from [employer]  . SYRINGE-NEEDLE, DISP, 3 ML (B-D 3CC LUER-LOK SYR 21GX1") 21G X 1" 3 ML MISC USE AS DIRECTED  . triamcinolone ointment (KENALOG) 0.5 % Apply 1 application topically 2 (two) times daily. Apply to rash 2 to 3 x/day (Patient taking differently: Apply 1 application topically as needed. Apply to rash 2 to 3 x/day)  . valACYclovir (VALTREX) 1000 MG tablet Take 1 tablet daily prophylaxis for fever blisters (Patient taking  differently: as needed. Take 1 tablet daily prophylaxis for fever blisters)  . testosterone cypionate (DEPOTESTOSTERONE CYPIONATE) 200 MG/ML injection INJECT 2 ML INTRAMUSCULARLY EVERY 2 WEEKS (Patient not taking: Reported on 01/11/2019)   No current facility-administered medications on file prior to visit.     Allergies:  Allergies  Allergen Reactions  . Toprol Xl [Metoprolol Tartrate]     ED     Medical History:  Past Medical History:  Diagnosis Date  . Allergy   . Arthritis   . Elevated hemoglobin A1c   . Elevated LDL cholesterol level   . Hx of colonic polyp 11/20/2014  . Hypertension   . Hypogonadism male   . Neuromuscular disorder (HCC)    Trigeminar neuralgia  . Sleep apnea    wears CPAP  . Vitamin D deficiency    Family history- Reviewed and unchanged Social history- Reviewed and unchanged   Review of Systems:  Review of Systems  Constitutional: Negative for malaise/fatigue and weight loss.  HENT: Negative for hearing loss and tinnitus.   Eyes: Negative for blurred vision and double vision.  Respiratory: Negative for cough, shortness of breath and wheezing.   Cardiovascular: Negative for chest pain, palpitations, orthopnea, claudication and leg  swelling.  Gastrointestinal: Negative for abdominal pain, blood in stool, constipation, diarrhea, heartburn, melena, nausea and vomiting.  Genitourinary: Negative.   Musculoskeletal: Negative for joint pain and myalgias.  Skin: Negative for rash.  Neurological: Negative for dizziness, tingling, sensory change, weakness and headaches.  Endo/Heme/Allergies: Negative for polydipsia.  Psychiatric/Behavioral: Negative.   All other systems reviewed and are negative.     Physical Exam: BP 124/78   Pulse 80   Temp (!) 96.4 F (35.8 C)   Ht 6\' 1"  (1.854 m)   Wt 257 lb (116.6 kg)   SpO2 99%   BMI 33.91 kg/m  Wt Readings from Last 3 Encounters:  01/11/19 257 lb (116.6 kg)  10/04/18 259 lb (117.5 kg)  09/08/18 252 lb  (114.3 kg)   General Appearance: Well nourished, in no apparent distress. Eyes: PERRLA, EOMs, conjunctiva no swelling or erythema Sinuses: No Frontal/maxillary tenderness ENT/Mouth: Ext aud canals clear, TMs without erythema, bulging. No erythema, swelling, or exudate on post pharynx.  Tonsils not swollen or erythematous. Hearing normal.  Neck: Supple, thyroid normal.  Respiratory: Respiratory effort normal, BS equal bilaterally without rales, rhonchi, wheezing or stridor.  Cardio: RRR with no MRGs. Brisk peripheral pulses without edema.  Abdomen: Soft, + BS.  Non tender, no guarding, rebound, hernias, masses. Lymphatics: Non tender without lymphadenopathy.  Musculoskeletal: Full ROM, 5/5 strength, Normal gait.  Skin: Warm, dry without rashes, lesions, ecchymosis.  Neuro: Cranial nerves intact. No cerebellar symptoms.  Psych: Awake and oriented X 3, normal affect, Insight and Judgment appropriate.    Izora Ribas, NP 1:08 PM [facility]

## 2019-01-11 ENCOUNTER — Encounter: Payer: Self-pay | Admitting: Adult Health

## 2019-01-11 ENCOUNTER — Other Ambulatory Visit: Payer: Self-pay

## 2019-01-11 ENCOUNTER — Ambulatory Visit: Payer: BC Managed Care – PPO | Admitting: Adult Health

## 2019-01-11 ENCOUNTER — Ambulatory Visit: Payer: Self-pay | Admitting: Adult Health

## 2019-01-11 VITALS — BP 124/78 | HR 80 | Temp 96.4°F | Ht 73.0 in | Wt 257.0 lb

## 2019-01-11 DIAGNOSIS — N182 Chronic kidney disease, stage 2 (mild): Secondary | ICD-10-CM

## 2019-01-11 DIAGNOSIS — E291 Testicular hypofunction: Secondary | ICD-10-CM

## 2019-01-11 DIAGNOSIS — R7303 Prediabetes: Secondary | ICD-10-CM | POA: Diagnosis not present

## 2019-01-11 DIAGNOSIS — E782 Mixed hyperlipidemia: Secondary | ICD-10-CM

## 2019-01-11 DIAGNOSIS — R7309 Other abnormal glucose: Secondary | ICD-10-CM

## 2019-01-11 DIAGNOSIS — E559 Vitamin D deficiency, unspecified: Secondary | ICD-10-CM | POA: Diagnosis not present

## 2019-01-11 DIAGNOSIS — Z79899 Other long term (current) drug therapy: Secondary | ICD-10-CM

## 2019-01-11 DIAGNOSIS — I10 Essential (primary) hypertension: Secondary | ICD-10-CM

## 2019-01-11 DIAGNOSIS — E669 Obesity, unspecified: Secondary | ICD-10-CM

## 2019-01-11 NOTE — Patient Instructions (Addendum)
Goals    . Exercise 150 min/wk Moderate Activity     Try HIIT (high intensity interval training) instead of steady state cardio Focus on weights/resistance exercise for better basal metabolic benefits    . LDL CALC < 100    . Weight (lb) < 225 lb (102.1 kg)       Know what a healthy weight is for you (roughly BMI <25) and aim to maintain this  Aim for 7+ servings of fruits and vegetables daily  65-80+ fluid ounces of water or unsweet tea for healthy kidneys  Limit to max 1 drink of alcohol per day; avoid smoking/tobacco  Limit animal fats in diet for cholesterol and heart health - choose grass fed whenever available  Avoid highly processed foods, and foods high in saturated/trans fats  Aim for low stress - take time to unwind and care for your mental health  Aim for 150 min of moderate intensity exercise weekly for heart health, and weights twice weekly for bone health  Aim for 7-9 hours of sleep daily   Weight loss tips that helped me:    Drink 1/2 your body weight in fluid ounces of water daily; drink a tall glass of water 30 min before meals  Don't eat until you're stuffed- listen to your stomach and eat until you are 80% full   Try eating off of a salad plate; wait 10 min after finishing before going back for seconds  Start by eating the vegetables on your plate; aim for 50% of your meals to be fruits or vegetables  Then eat your protein - lean meats (grass fed if possible), fish, beans, nuts in moderation  Eat your carbs/starch last ONLY if you still are hungry. If you can, stop before finishing it all  Avoid sugar and flour - the closer it looks to it's original form in nature, typically the better it is for you  Splurge in moderation - "assign" days when you get to splurge and have the "bad stuff" - I like to follow a 80% - 20% plan- "good" choices 80 % of the time, "bad" choices in moderation 20% of the time  Simple equation is: Calories out > calories in =  weight loss - even if you eat the bad stuff, if you limit portions, you will still lose weight

## 2019-01-12 ENCOUNTER — Other Ambulatory Visit: Payer: Self-pay | Admitting: Adult Health

## 2019-01-12 LAB — CBC WITH DIFFERENTIAL/PLATELET
Absolute Monocytes: 655 {cells}/uL (ref 200–950)
Basophils Absolute: 40 {cells}/uL (ref 0–200)
Basophils Relative: 0.8 %
Eosinophils Absolute: 140 {cells}/uL (ref 15–500)
Eosinophils Relative: 2.8 %
HCT: 41 % (ref 38.5–50.0)
Hemoglobin: 13.9 g/dL (ref 13.2–17.1)
Lymphs Abs: 1390 {cells}/uL (ref 850–3900)
MCH: 29.3 pg (ref 27.0–33.0)
MCHC: 33.9 g/dL (ref 32.0–36.0)
MCV: 86.5 fL (ref 80.0–100.0)
MPV: 8.7 fL (ref 7.5–12.5)
Monocytes Relative: 13.1 %
Neutro Abs: 2775 {cells}/uL (ref 1500–7800)
Neutrophils Relative %: 55.5 %
Platelets: 326 Thousand/uL (ref 140–400)
RBC: 4.74 Million/uL (ref 4.20–5.80)
RDW: 12.4 % (ref 11.0–15.0)
Total Lymphocyte: 27.8 %
WBC: 5 Thousand/uL (ref 3.8–10.8)

## 2019-01-12 LAB — COMPLETE METABOLIC PANEL WITH GFR
AG Ratio: 1.7 (calc) (ref 1.0–2.5)
ALT: 27 U/L (ref 9–46)
AST: 36 U/L — ABNORMAL HIGH (ref 10–35)
Albumin: 4.7 g/dL (ref 3.6–5.1)
Alkaline phosphatase (APISO): 71 U/L (ref 35–144)
BUN: 12 mg/dL (ref 7–25)
CO2: 31 mmol/L (ref 20–32)
Calcium: 9.7 mg/dL (ref 8.6–10.3)
Chloride: 96 mmol/L — ABNORMAL LOW (ref 98–110)
Creat: 1.11 mg/dL (ref 0.70–1.33)
GFR, Est African American: 87 mL/min/{1.73_m2} (ref >=60)
GFR, Est Non African American: 75 mL/min/{1.73_m2} (ref >=60)
Globulin: 2.7 g/dL (calc) (ref 1.9–3.7)
Glucose, Bld: 106 mg/dL — ABNORMAL HIGH (ref 65–99)
Potassium: 4.5 mmol/L (ref 3.5–5.3)
Sodium: 134 mmol/L — ABNORMAL LOW (ref 135–146)
Total Bilirubin: 0.4 mg/dL (ref 0.2–1.2)
Total Protein: 7.4 g/dL (ref 6.1–8.1)

## 2019-01-12 LAB — LIPID PANEL
Cholesterol: 163 mg/dL
HDL: 48 mg/dL
LDL Cholesterol (Calc): 88 mg/dL
Non-HDL Cholesterol (Calc): 115 mg/dL
Total CHOL/HDL Ratio: 3.4 (calc)
Triglycerides: 172 mg/dL — ABNORMAL HIGH

## 2019-01-12 LAB — HEMOGLOBIN A1C
Hgb A1c MFr Bld: 5.9 % of total Hgb — ABNORMAL HIGH (ref <5.7)
Mean Plasma Glucose: 123 (calc)
eAG (mmol/L): 6.8 (calc)

## 2019-01-12 LAB — TESTOSTERONE: Testosterone: 440 ng/dL (ref 250–827)

## 2019-01-12 LAB — TSH: TSH: 1.36 m[IU]/L (ref 0.40–4.50)

## 2019-01-12 LAB — MAGNESIUM: Magnesium: 2.3 mg/dL (ref 1.5–2.5)

## 2019-01-12 NOTE — Progress Notes (Signed)
Virtual Visit via Video Note The purpose of this virtual visit is to provide medical care while limiting exposure to the novel coronavirus.    Consent was obtained for video visit:  Yes.   Answered questions that patient had about telehealth interaction:  Yes.   I discussed the limitations, risks, security and privacy concerns of performing an evaluation and management service by telemedicine. I also discussed with the patient that there may be a patient responsible charge related to this service. The patient expressed understanding and agreed to proceed.  Pt location: Home Physician Location: office Name of referring provider:  [name], MD I connected with Carolin Guernsey at patients initiation/request on 01/15/2019 at 11:00 AM EDT by video enabled telemedicine application and verified that I am speaking with the correct person using two identifiers. Pt MRN:  [id number] Pt DOB:  07/31/1966 Video Participants:  Carolin Guernsey   History of Present Illness:  Donald Schwartz is a 53 year old right-handed man with hypertension, CKD stage II, hyperlipidemia and OSA who follows up for right-sided trigeminal neuralgia.  UPDATE:  Current medication: Oxcarbazepine 600 mg twice daily  He is doing well.  He never had a full flare during the winter.  He has been pain-free since mid January-early February.  No side effects.  01/11/2019 labs: CMP with NA 134, K4.5, CL 96, CO2 31, glucose 106, BUN 12, CR 1.11, T bili 0.4, ALP 71, AST 36, ALT 27; CBC with WBC 5, Hgb 13.9, HCT 41, PLT 326.  HISTORY:  Beginning in January 2016, he started having pain involving the right side of the face.  It started in the right eye, then involving the right top of the head, right side of nose and right upper teeth.  It is a paroxysmal shooting pain lasting a few seconds at a time and occurring several times daily.  It is triggered by lightly touching the cheek, eating or feeling of water running over his head.   Sometimes there is slight tingling but usually not.  Nothing relieves it.  It aborts spontaneously.  There is no numbness or facial weakness.  He was evaluated by the eye doctor and dentist with normal exams.  Prior medications: Lyrica.  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  His insurance would not cover baclofen.  Past Medical History: Past Medical History:  Diagnosis Date  . Allergy   . Arthritis   . Elevated hemoglobin A1c   . Elevated LDL cholesterol level   . Hx of colonic polyp 11/20/2014  . Hypertension   . Hypogonadism male   . Neuromuscular disorder (HCC)    Trigeminar neuralgia  . Sleep apnea    wears CPAP  . Vitamin D deficiency     Medications: Outpatient Encounter Medications as of 01/15/2019  Medication Sig  . aspirin 81 MG chewable tablet Chew 1 tablet (81 mg total) by mouth daily.  . Cholecalciferol (VITAMIN D PO) Take 10,000 Units by mouth daily.   Marland Kitchen diltiazem (CARDIZEM) 120 MG tablet Take 1 tablet (120 mg total) by mouth daily.  . fluticasone (FLONASE) 50 MCG/ACT nasal spray USE ONE SPRAY EACH NOSTRIL TWICE DAILY  . Lecithin 1200 MG CAPS Take by mouth daily.  Marland Kitchen lisinopril (PRINIVIL,ZESTRIL) 40 MG tablet TAKE 1 TABLET BY MOUTH ONCE DAILY  . Magnesium 250 MG TABS Take 500 mg by mouth 2 (two) times a day.   . Multiple Vitamins-Minerals (MULTIVITAMIN MEN PO) Take by mouth daily.  . Omega-3 Fatty  Acids (FISH OIL) 1000 MG CAPS Take 1,200 mg by mouth. Take two tablets in the morning and two tablets in the evening  . oxcarbazepine (TRILEPTAL) 600 MG tablet TAKE 1 TABLET BY MOUTH TWICE DAILY  . phentermine (ADIPEX-P) 37.5 MG tablet Take 1 tablet every morning for Dieting & Weight Loss  . Red Yeast Rice Extract (RED YEAST RICE PO) Take 600 mg by mouth 2 (two) times daily.   . sildenafil (VIAGRA) 100 MG tablet 1/2-1 pill as needed 1 hour before intercourse, can get with good RX from [employer]  . SYRINGE-NEEDLE, DISP, 3 ML (B-D  3CC LUER-LOK SYR 21GX1") 21G X 1" 3 ML MISC USE AS DIRECTED  . triamcinolone ointment (KENALOG) 0.5 % Apply 1 application topically 2 (two) times daily. Apply to rash 2 to 3 x/day (Patient taking differently: Apply 1 application topically as needed. Apply to rash 2 to 3 x/day)  . valACYclovir (VALTREX) 1000 MG tablet Take 1 tablet daily prophylaxis for fever blisters (Patient taking differently: as needed. Take 1 tablet daily prophylaxis for fever blisters)   No facility-administered encounter medications on file as of 01/15/2019.     Allergies: Allergies  Allergen Reactions  . Toprol Xl [Metoprolol Tartrate]     ED    Family History: Family History  Problem Relation Age of Onset  . Colon cancer Maternal Uncle   . Prostate cancer Maternal Uncle   . Colon cancer Paternal Uncle   . Colon cancer Maternal Grandfather   . Colon cancer Paternal Grandfather   . Prostate cancer Father   . Stomach cancer Neg Hx   . Esophageal cancer Neg Hx     Social History: Social History   Socioeconomic History  . Marital status: Married    Spouse name: Not on file  . Number of children: Not on file  . Years of education: Not on file  . Highest education level: Not on file  Occupational History  . Not on file  Social Needs  . Financial resource strain: Not on file  . Food insecurity:    Worry: Not on file    Inability: Not on file  . Transportation needs:    Medical: Not on file    Non-medical: Not on file  Tobacco Use  . Smoking status: Former Smoker    Last attempt to quit: 09/20/1989    Years since quitting: 29.3  . Smokeless tobacco: Never Used  Substance and Sexual Activity  . Alcohol use: No    Alcohol/week: 0.0 standard drinks  . Drug use: No  . Sexual activity: Yes    Partners: Female  Lifestyle  . Physical activity:    Days per week: Not on file    Minutes per session: Not on file  . Stress: Not on file  Relationships  . Social connections:    Talks on phone: Not on  file    Gets together: Not on file    Attends religious service: Not on file    Active member of club or organization: Not on file    Attends meetings of clubs or organizations: Not on file    Relationship status: Not on file  . Intimate partner violence:    Fear of current or ex partner: Not on file    Emotionally abused: Not on file    Physically abused: Not on file    Forced sexual activity: Not on file  Other Topics Concern  . Not on file  Social History Narrative  .  Not on file   Observations/Objective:   Height 6\' 1"  (1.854 m), weight 254 lb (115.2 kg). Alert and oriented.  Speech fluent and not dysarthric.  Language intact.  Eyes orthophoric and move in all directions.  Face symmetric.    Assessment and Plan:   Right-sided trigeminal neuralgia, stable  1.  Oxcarbazepine 600mg  twice daily 2.  Follow up in 7 months.  Follow Up Instructions:    -I discussed the assessment and treatment plan with the patient. The patient was provided an opportunity to ask questions and all were answered. The patient agreed with the plan and demonstrated an understanding of the instructions.   The patient was advised to call back or seek an in-person evaluation if the symptoms worsen or if the condition fails to improve as anticipated.    Total Time spent in visit with the patient was:  5 minutes.  Dudley Major, DO

## 2019-01-15 ENCOUNTER — Other Ambulatory Visit: Payer: Self-pay

## 2019-01-15 ENCOUNTER — Other Ambulatory Visit: Payer: Self-pay | Admitting: Adult Health

## 2019-01-15 ENCOUNTER — Encounter: Payer: Self-pay | Admitting: Neurology

## 2019-01-15 ENCOUNTER — Telehealth (INDEPENDENT_AMBULATORY_CARE_PROVIDER_SITE_OTHER): Payer: BC Managed Care – PPO | Admitting: Neurology

## 2019-01-15 VITALS — Ht 73.0 in | Wt 254.0 lb

## 2019-01-15 DIAGNOSIS — G500 Trigeminal neuralgia: Secondary | ICD-10-CM | POA: Diagnosis not present

## 2019-01-16 ENCOUNTER — Ambulatory Visit: Payer: Self-pay | Admitting: Neurology

## 2019-02-20 ENCOUNTER — Other Ambulatory Visit: Payer: Self-pay | Admitting: Internal Medicine

## 2019-04-17 ENCOUNTER — Encounter: Payer: Self-pay | Admitting: Internal Medicine

## 2019-04-17 NOTE — Patient Instructions (Signed)
- Vit D  And Vit C 1,000 mg  are recommended to help protect  against the Covid_19 and other Corona viruses.   - Also it's recommended to take Zinc 50 mg to help  protect against the Covid_19  And best place to get  is also on [employer] and don't pay more than 6-8 cents /pill !   =============================== Coronavirus (COVID-19) Are you at risk?  Are you at risk for the Coronavirus (COVID-19)?  To be considered HIGH RISK for Coronavirus (COVID-19), you have to meet the following criteria:  . Traveled to [place], [place], [place], [place] or [place]; or in the [place] to [place], [place], [place]  . or [place]; and have fever, cough, and shortness of breath within the last 2 weeks of travel OR . Been in close contact with a person diagnosed with COVID-19 within the last 2 weeks and have  . fever, cough,and shortness of breath .  . IF YOU DO NOT MEET THESE CRITERIA, YOU ARE CONSIDERED LOW RISK FOR COVID-19.  What to do if you are HIGH RISK for COVID-19?  . If you are having a medical emergency, call 911. . Seek medical care right away. Before you go to a doctor's office, urgent care or emergency department, .  call ahead and tell them about your recent travel, contact with someone diagnosed with COVID-19  .  and your symptoms.  . You should receive instructions from your physician's office regarding next steps of care.  . When you arrive at healthcare provider, tell the healthcare staff immediately you have returned from  . visiting [place], [place], [place], [place] or [place]; or traveled in the [place] to [place], [place],  . [place] or [place] in the last two weeks or you have been in close contact with a person diagnosed with  . COVID-19 in the last 2 weeks.   . Tell the health care staff about your symptoms: fever, cough and shortness of breath. . After you have been seen by a medical provider, you will be either: o Tested for (COVID-19) and  discharged home on quarantine except to seek medical care if  o symptoms worsen, and asked to  - Stay home and avoid contact with others until you get your results (4-5 days)  - Avoid travel on public transportation if possible (such as bus, train, or airplane) or o Sent to the Emergency Department by EMS for evaluation, COVID-19 testing  and  o possible admission depending on your condition and test results.  What to do if you are LOW RISK for COVID-19?  Reduce your risk of any infection by using the same precautions used for avoiding the common cold or flu:  . Wash your hands often with soap and warm water for at least 20 seconds.  If soap and water are not readily available,  . use an alcohol-based hand sanitizer with at least 60% alcohol.  . If coughing or sneezing, cover your mouth and nose by coughing or sneezing into the elbow areas of your shirt or coat, .  into a tissue or into your sleeve (not your hands). . Avoid shaking hands with others and consider head nods or verbal greetings only. . Avoid touching your eyes, nose, or mouth with unwashed hands.  . Avoid close contact with people who are sick. . Avoid places or events with large numbers of people in one location, like concerts or sporting events. . Carefully consider travel plans   you have or are making. . If you are planning any travel outside or inside the [place], visit the CDC's Travelers' Health webpage for the latest health notices. . If you have some symptoms but not all symptoms, continue to monitor at home and seek medical attention  . if your symptoms worsen. . If you are having a medical emergency, call 911. >>>>>>>>>>>>>>>>>>>>>>>>>>>> Preventive Care for Adults  A healthy lifestyle and preventive care can promote health and wellness. Preventive health guidelines for men include the following key practices:  A routine yearly physical is a good way to check with your health care provider about your health and  preventative screening. It is a chance to share any concerns and updates on your health and to receive a thorough exam.  Visit your dentist for a routine exam and preventative care every 6 months. Brush your teeth twice a day and floss once a day. Good oral hygiene prevents tooth decay and gum disease.  The frequency of eye exams is based on your age, health, family medical history, use of contact lenses, and other factors. Follow your health care provider's recommendations for frequency of eye exams.  Eat a healthy diet. Foods such as vegetables, fruits, whole grains, low-fat dairy products, and lean protein foods contain the nutrients you need without too many calories. Decrease your intake of foods high in solid fats, added sugars, and salt. Eat the right amount of calories for you. Get information about a proper diet from your health care provider, if necessary.  Regular physical exercise is one of the most important things you can do for your health. Most adults should get at least 150 minutes of moderate-intensity exercise (any activity that increases your heart rate and causes you to sweat) each week. In addition, most adults need muscle-strengthening exercises on 2 or more days a week.  Maintain a healthy weight. The body mass index (BMI) is a screening tool to identify possible weight problems. It provides an estimate of body fat based on height and weight. Your health care provider can find your BMI and can help you achieve or maintain a healthy weight. For adults 20 years and older:  A BMI below 18.5 is considered underweight.  A BMI of 18.5 to 24.9 is normal.  A BMI of 25 to 29.9 is considered overweight.  A BMI of 30 and above is considered obese.  Maintain normal blood lipids and cholesterol levels by exercising and minimizing your intake of saturated fat. Eat a balanced diet with plenty of fruit and vegetables. Blood tests for lipids and cholesterol should begin at age 20 and be  repeated every 5 years. If your lipid or cholesterol levels are high, you are over 50, or you are at high risk for heart disease, you may need your cholesterol levels checked more frequently. Ongoing high lipid and cholesterol levels should be treated with medicines if diet and exercise are not working.  If you smoke, find out from your health care provider how to quit. If you do not use tobacco, do not start.  Lung cancer screening is recommended for adults aged 55-80 years who are at high risk for developing lung cancer because of a history of smoking. A yearly low-dose CT scan of the lungs is recommended for people who have at least a 30-pack-year history of smoking and are a current smoker or have quit within the past 15 years. A pack year of smoking is smoking an average of 1 pack of cigarettes a   day for 1 year (for example: 1 pack a day for 30 years or 2 packs a day for 15 years). Yearly screening should continue until the smoker has stopped smoking for at least 15 years. Yearly screening should be stopped for people who develop a health problem that would prevent them from having lung cancer treatment.  If you choose to drink alcohol, do not have more than 2 drinks per day. One drink is considered to be 12 ounces (355 mL) of beer, 5 ounces (148 mL) of wine, or 1.5 ounces (44 mL) of liquor.  Avoid use of street drugs. Do not share needles with anyone. Ask for help if you need support or instructions about stopping the use of drugs.  High blood pressure causes heart disease and increases the risk of stroke. Your blood pressure should be checked at least every 1-2 years. Ongoing high blood pressure should be treated with medicines, if weight loss and exercise are not effective.  If you are 45-79 years old, ask your health care provider if you should take aspirin to prevent heart disease.  Diabetes screening involves taking a blood sample to check your fasting blood sugar level. This should be done  once every 3 years, after age 45, if you are within normal weight and without risk factors for diabetes. Testing should be considered at a younger age or be carried out more frequently if you are overweight and have at least 1 risk factor for diabetes.  Colorectal cancer can be detected and often prevented. Most routine colorectal cancer screening begins at the age of 50 and continues through age 75. However, your health care provider may recommend screening at an earlier age if you have risk factors for colon cancer. On a yearly basis, your health care provider may provide home test kits to check for hidden blood in the stool. Use of a small camera at the end of a tube to directly examine the colon (sigmoidoscopy or colonoscopy) can detect the earliest forms of colorectal cancer. Talk to your health care provider about this at age 50, when routine screening begins. Direct exam of the colon should be repeated every 5-10 years through age 75, unless early forms of precancerous polyps or small growths are found.   Talk with your health care provider about prostate cancer screening.  Testicular cancer screening isrecommended for adult males. Screening includes self-exam, a health care provider exam, and other screening tests. Consult with your health care provider about any symptoms you have or any concerns you have about testicular cancer.  Use sunscreen. Apply sunscreen liberally and repeatedly throughout the day. You should seek shade when your shadow is shorter than you. Protect yourself by wearing long sleeves, pants, a wide-brimmed hat, and sunglasses year round, whenever you are outdoors.  Once a month, do a whole-body skin exam, using a mirror to look at the skin on your back. Tell your health care provider about new moles, moles that have irregular borders, moles that are larger than a pencil eraser, or moles that have changed in shape or color.  Stay current with required vaccines  (immunizations).  Influenza vaccine. All adults should be immunized every year.  Tetanus, diphtheria, and acellular pertussis (Td, Tdap) vaccine. An adult who has not previously received Tdap or who does not know his vaccine status should receive 1 dose of Tdap. This initial dose should be followed by tetanus and diphtheria toxoids (Td) booster doses every 10 years. Adults with an unknown or incomplete history   of completing a 3-dose immunization series with Td-containing vaccines should begin or complete a primary immunization series including a Tdap dose. Adults should receive a Td booster every 10 years.  Varicella vaccine. An adult without evidence of immunity to varicella should receive 2 doses or a second dose if he has previously received 1 dose.  Human papillomavirus (HPV) vaccine. Males aged 13-21 years who have not received the vaccine previously should receive the 3-dose series. Males aged 22-26 years may be immunized. Immunization is recommended through the age of 26 years for any male who has sex with males and did not get any or all doses earlier. Immunization is recommended for any person with an immunocompromised condition through the age of 26 years if he did not get any or all doses earlier. During the 3-dose series, the second dose should be obtained 4-8 weeks after the first dose. The third dose should be obtained 24 weeks after the first dose and 16 weeks after the second dose.  Zoster vaccine. One dose is recommended for adults aged 60 years or older unless certain conditions are present.    PREVNAR  - Pneumococcal 13-valent conjugate (PCV13) vaccine. When indicated, a person who is uncertain of his immunization history and has no record of immunization should receive the PCV13 vaccine. An adult aged 19 years or older who has certain medical conditions and has not been previously immunized should receive 1 dose of PCV13 vaccine. This PCV13 should be followed with a dose of  pneumococcal polysaccharide (PPSV23) vaccine. The PPSV23 vaccine dose should be obtained at least 1 r more year(s) after the dose of PCV13 vaccine. An adult aged 19 years or older who has certain medical conditions and previously received 1 or more doses of PPSV23 vaccine should receive 1 dose of PCV13. The PCV13 vaccine dose should be obtained 1 or more years after the last PPSV23 vaccine dose.    PNEUMOVAX - Pneumococcal polysaccharide (PPSV23) vaccine. When PCV13 is also indicated, PCV13 should be obtained first. All adults aged 65 years and older should be immunized. An adult younger than age 65 years who has certain medical conditions should be immunized. Any person who resides in a nursing home or long-term care facility should be immunized. An adult smoker should be immunized. People with an immunocompromised condition and certain other conditions should receive both PCV13 and PPSV23 vaccines. People with human immunodeficiency virus (HIV) infection should be immunized as soon as possible after diagnosis. Immunization during chemotherapy or radiation therapy should be avoided. Routine use of PPSV23 vaccine is not recommended for American Indians, [place] Natives, or people younger than 65 years unless there are medical conditions that require PPSV23 vaccine. When indicated, people who have unknown immunization and have no record of immunization should receive PPSV23 vaccine. One-time revaccination 5 years after the first dose of PPSV23 is recommended for people aged 19-64 years who have chronic kidney failure, nephrotic syndrome, asplenia, or immunocompromised conditions. People who received 1-2 doses of PPSV23 before age 65 years should receive another dose of PPSV23 vaccine at age 65 years or later if at least 5 years have passed since the previous dose. Doses of PPSV23 are not needed for people immunized with PPSV23 at or after age 65 years.    Hepatitis A vaccine. Adults who wish to be protected  from this disease, have certain high-risk conditions, work with hepatitis A-infected animals, work in hepatitis A research labs, or travel to or work in countries with a high rate of   hepatitis A should be immunized. Adults who were previously unvaccinated and who anticipate close contact with an international adoptee during the first 60 days after arrival in the [place] from a country with a high rate of hepatitis A should be immunized.    Hepatitis B vaccine. Adults should be immunized if they wish to be protected from this disease, have certain high-risk conditions, may be exposed to blood or other infectious body fluids, are household contacts or sex partners of hepatitis B positive people, are clients or workers in certain care facilities, or travel to or work in countries with a high rate of hepatitis B.   Preventive Service / Frequency   Ages 40 to 64  Blood pressure check.  Lipid and cholesterol check  Lung cancer screening. / Every year if you are aged 55-80 years and have a 30-pack-year history of smoking and currently smoke or have quit within the past 15 years. Yearly screening is stopped once you have quit smoking for at least 15 years or develop a health problem that would prevent you from having lung cancer treatment.  Fecal occult blood test (FOBT) of stool. / Every year beginning at age 50 and continuing until age 75. You may not have to do this test if you get a colonoscopy every 10 years.  Flexible sigmoidoscopy** or colonoscopy.** / Every 5 years for a flexible sigmoidoscopy or every 10 years for a colonoscopy beginning at age 50 and continuing until age 75. Screening for abdominal aortic aneurysm (AAA)  by ultrasound is recommended for people who have history of high blood pressure or who are current or former smokers. +++++++++++ Recommend Adult Low Dose Aspirin or  coated  Aspirin 81 mg daily  To reduce risk of Colon Cancer 40 %,  Skin Cancer 26 % ,  Malignant  Melanoma 46%  and  Pancreatic cancer 60% ++++++++++++++++++++ Vitamin D goal  is between 70-100.  Please make sure that you are taking your Vitamin D as directed.  It is very important as a natural anti-inflammatory  helping hair, skin, and nails, as well as reducing stroke and heart attack risk.  It helps your bones and helps with mood. It also decreases numerous cancer risks so please take it as directed.  Low Vit D is associated with a 200-300% higher risk for CANCER  and 200-300% higher risk for HEART   ATTACK  &  STROKE.   ...................................... It is also associated with higher death rate at younger ages,  autoimmune diseases like Rheumatoid arthritis, Lupus, Multiple Sclerosis.    Also many other serious conditions, like depression, Alzheimer's Dementia, infertility, muscle aches, fatigue, fibromyalgia - just to name a few. +++++++++++++++++++++ Recommend the book "The END of DIETING" by Dr Joel Fuhrman  & the book "The END of DIABETES " by Dr Joel Fuhrman At [employer] - get book & Audio CD's    Being diabetic has a  300% increased risk for heart attack, stroke, cancer, and alzheimer- type vascular dementia. It is very important that you work harder with diet by avoiding all foods that are white. Avoid white rice (brown & wild rice is OK), white potatoes (sweetpotatoes in moderation is OK), White bread or wheat bread or anything made out of white flour like bagels, donuts, rolls, buns, biscuits, cakes, pastries, cookies, pizza crust, and pasta (made from white flour & egg whites) - vegetarian pasta or spinach or wheat pasta is OK. Multigrain breads like Arnold's or [employer], or multigrain sandwich thins   or flatbreads.  Diet, exercise and weight loss can reverse and cure diabetes in the early stages.  Diet, exercise and weight loss is very important in the control and prevention of complications of diabetes which affects every system in your body, ie. Brain -  dementia/stroke, eyes - glaucoma/blindness, heart - heart attack/heart failure, kidneys - dialysis, stomach - gastric paralysis, intestines - malabsorption, nerves - severe painful neuritis, circulation - gangrene & loss of a leg(s), and finally cancer and Alzheimers.    I recommend avoid fried & greasy foods,  sweets/candy, white rice (brown or wild rice or Quinoa is OK), white potatoes (sweet potatoes are OK) - anything made from white flour - bagels, doughnuts, rolls, buns, biscuits,white and wheat breads, pizza crust and traditional pasta made of white flour & egg white(vegetarian pasta or spinach or wheat pasta is OK).  Multi-grain bread is OK - like multi-grain flat bread or sandwich thins. Avoid alcohol in excess. Exercise is also important.    Eat all the vegetables you want - avoid meat, especially red meat and dairy - especially cheese.  Cheese is the most concentrated form of trans-fats which is the worst thing to clog up our arteries. Veggie cheese is OK which can be found in the fresh produce section at [employer] or [employer] or [employer]  ++++++++++++++++++++++ DASH Eating Plan  DASH stands for "Dietary Approaches to Stop Hypertension."   The DASH eating plan is a healthy eating plan that has been shown to reduce high blood pressure (hypertension). Additional health benefits may include reducing the risk of type 2 diabetes mellitus, heart disease, and stroke. The DASH eating plan may also help with weight loss. WHAT DO I NEED TO KNOW ABOUT THE DASH EATING PLAN? For the DASH eating plan, you will follow these general guidelines:  Choose foods with a percent daily value for sodium of less than 5% (as listed on the food label).  Use salt-free seasonings or herbs instead of table salt or sea salt.  Check with your health care provider or pharmacist before using salt substitutes.  Eat lower-sodium products, often labeled as "lower sodium" or "no salt added."  Eat fresh  foods.  Eat more vegetables, fruits, and low-fat dairy products.  Choose whole grains. Look for the word "whole" as the first word in the ingredient list.  Choose fish   Limit sweets, desserts, sugars, and sugary drinks.  Choose heart-healthy fats.  Eat veggie cheese   Eat more home-cooked food and less restaurant, buffet, and fast food.  Limit fried foods.  Cook foods using methods other than frying.  Limit canned vegetables. If you do use them, rinse them well to decrease the sodium.  When eating at a restaurant, ask that your food be prepared with less salt, or no salt if possible.                      WHAT FOODS CAN I EAT? Read Dr Joel Fuhrman's books on The End of Dieting & The End of Diabetes  Grains Whole grain or whole wheat bread. Brown rice. Whole grain or whole wheat pasta. Quinoa, bulgur, and whole grain cereals. Low-sodium cereals. Corn or whole wheat flour tortillas. Whole grain cornbread. Whole grain crackers. Low-sodium crackers.  Vegetables Fresh or frozen vegetables (raw, steamed, roasted, or grilled). Low-sodium or reduced-sodium tomato and vegetable juices. Low-sodium or reduced-sodium tomato sauce and paste. Low-sodium or reduced-sodium canned vegetables.   Fruits All fresh, canned (in natural juice), or   frozen fruits.  Protein Products  All fish and seafood.  Dried beans, peas, or lentils. Unsalted nuts and seeds. Unsalted canned beans.  Dairy Low-fat dairy products, such as skim or 1% milk, 2% or reduced-fat cheeses, low-fat ricotta or cottage cheese, or plain low-fat yogurt. Low-sodium or reduced-sodium cheeses.  Fats and Oils Tub margarines without trans fats. Light or reduced-fat mayonnaise and salad dressings (reduced sodium). Avocado. Safflower, olive, or canola oils. Natural peanut or almond butter.  Other Unsalted popcorn and pretzels. The items listed above may not be a complete list of recommended foods or beverages. Contact your  dietitian for more options.  +++++++++++++++++++  WHAT FOODS ARE NOT RECOMMENDED? Grains/ White flour or wheat flour White bread. White pasta. White rice. Refined cornbread. Bagels and croissants. Crackers that contain trans fat.  Vegetables  Creamed or fried vegetables. Vegetables in a . Regular canned vegetables. Regular canned tomato sauce and paste. Regular tomato and vegetable juices.  Fruits Dried fruits. Canned fruit in light or heavy syrup. Fruit juice.  Meat and Other Protein Products Meat in general - RED meat & White meat.  Fatty cuts of meat. Ribs, chicken wings, all processed meats as bacon, sausage, bologna, salami, fatback, hot dogs, bratwurst and packaged luncheon meats.  Dairy Whole or 2% milk, cream, half-and-half, and cream cheese. Whole-fat or sweetened yogurt. Full-fat cheeses or blue cheese. Non-dairy creamers and whipped toppings. Processed cheese, cheese spreads, or cheese curds.  Condiments Onion and garlic salt, seasoned salt, table salt, and sea salt. Canned and packaged gravies. Worcestershire sauce. Tartar sauce. Barbecue sauce. Teriyaki sauce. Soy sauce, including reduced sodium. Steak sauce. Fish sauce. Oyster sauce. Cocktail sauce. Horseradish. Ketchup and mustard. Meat flavorings and tenderizers. Bouillon cubes. Hot sauce. Tabasco sauce. Marinades. Taco seasonings. Relishes.  Fats and Oils Butter, stick margarine, lard, shortening and bacon fat. Coconut, palm kernel, or palm oils. Regular salad dressings.  Pickles and olives. Salted popcorn and pretzels.  The items listed above may not be a complete list of foods and beverages to avoid.    

## 2019-04-17 NOTE — Progress Notes (Signed)
Annual  Screening/Preventative Visit  & Comprehensive Evaluation & Examination     This very nice 53 y.o. MWM presents for a Screening /Preventative Visit & comprehensive evaluation and management of multiple medical co-morbidities.  Patient has been followed for HTN, HLD, Prediabetes and Vitamin D Deficiency. Patient is on Trileptal for Trigeminal Neuralgia followed by Dr Tomi Likens. Patient also is on CPAP for OSA with improved sleep hygiene.      HTN predates since 2002. Patient's BP has been controlled on Diltiazem & Lisinopril at home.  Today's BP is at goal - 130/82. Patient denies any cardiac symptoms as chest pain, palpitations, shortness of breath, dizziness or ankle swelling.     Patient's hyperlipidemia is controlled with diet, RYRE & Fish Oil. Last lipids were at goal albeit slightly elevated Trig's: Lab Results  Component Value Date   CHOL 163 01/11/2019   HDL 48 01/11/2019   LDLCALC 88 01/11/2019   TRIG 172 (H) 01/11/2019   CHOLHDL 3.4 01/11/2019      Patient has Morbid Obesity (BMI .35) and  hx/o prediabetes (A1c 6.3% / 2008)  and patient denies reactive hypoglycemic symptoms, visual blurring, diabetic polys or paresthesias. Last A1c was not at goal: Lab Results  Component Value Date   HGBA1C 5.9 (H) 01/11/2019       Patient has hx/o Testosterone Deficiency and has been on parenteral replacement with improved stamina & sense of well being, but apparently stopped it earlier this year. .      Finally, patient has history of Vitamin D Deficiency ("31" / 2008) and last vitamin D was at goal: Lab Results  Component Value Date   VD25OH 72 10/04/2018   Current Outpatient Medications on File Prior to Visit  Medication Sig  . aspirin 81 MG chewable tablet Chew 1 tablet (81 mg total) by mouth daily.  . Cholecalciferol (VITAMIN D PO) Take 10,000 Units by mouth daily.   Marland Kitchen diltiazem (CARDIZEM) 120 MG tablet Take 1 tablet Daily for BP  . fluticasone (FLONASE) 50 MCG/ACT nasal spray  USE ONE SPRAY EACH NOSTRIL TWICE DAILY  . Lecithin 1200 MG CAPS Take by mouth daily.  Marland Kitchen lisinopril (ZESTRIL) 40 MG tablet Take 1 tablet Daily for BP  . Magnesium 250 MG TABS Take 500 mg by mouth 2 (two) times a day.   . Multiple Vitamins-Minerals (MULTIVITAMIN MEN PO) Take by mouth daily.  . Omega-3 Fatty Acids (FISH OIL) 1000 MG CAPS Take 1,200 mg by mouth. Take two tablets in the morning and two tablets in the evening  . oxcarbazepine (TRILEPTAL) 600 MG tablet TAKE 1 TABLET BY MOUTH TWICE DAILY  . Red Yeast Rice Extract (RED YEAST RICE PO) Take 600 mg by mouth 2 (two) times daily.   . sildenafil (VIAGRA) 100 MG tablet 1/2-1 pill as needed 1 hour before intercourse, can get with good RX from [employer]  . SYRINGE-NEEDLE, DISP, 3 ML (B-D 3CC LUER-LOK SYR 21GX1") 21G X 1" 3 ML MISC USE AS DIRECTED  . triamcinolone ointment (KENALOG) 0.5 % Apply 1 application topically 2 (two) times daily. Apply to rash 2 to 3 x/day (Patient taking differently: Apply 1 application topically as needed. Apply to rash 2 to 3 x/day)  . valACYclovir (VALTREX) 1000 MG tablet Take 1 tablet daily prophylaxis for fever blisters (Patient taking differently: as needed. Take 1 tablet daily prophylaxis for fever blisters)   No current facility-administered medications on file prior to visit.    Allergies  Allergen Reactions  . Toprol  Xl [Metoprolol Tartrate]     ED   Past Medical History:  Diagnosis Date  . Allergy   . Arthritis   . Elevated hemoglobin A1c   . Elevated LDL cholesterol level   . Hx of colonic polyp 11/20/2014  . Hypertension   . Hypogonadism male   . Neuromuscular disorder (HCC)    Trigeminar neuralgia  . Sleep apnea    wears CPAP  . Vitamin D deficiency    Health Maintenance  Topic Date Due  . INFLUENZA VACCINE  04/21/2019  . COLONOSCOPY  01/30/2021  . TETANUS/TDAP  02/02/2026  . Hepatitis C Screening  Completed  . HIV Screening  Completed   Immunization History  Administered Date(s)  Administered  . Influenza Inj Mdck Quad With Preservative 06/02/2017, 06/28/2018  . Influenza Split 07/10/2014  . Influenza-Unspecified 07/19/2016  . PPD Test 01/07/2014, 03/20/2018  . Pneumococcal-Unspecified 09/20/2009  . Td 09/20/2005  . Tdap 02/03/2016   Last Colon - 01/30/2018 - Dr Carlean Purl - polyps - Recc 3 yr f/u due May 2022  Past Surgical History:  Procedure Laterality Date  . c5-6 fusion     2012  . COLONOSCOPY    . HIP FRACTURE SURGERY Right 1986   avulsion/chip fracture ?stress fracture   Family History  Problem Relation Age of Onset  . Colon cancer Maternal Uncle   . Prostate cancer Maternal Uncle   . Colon cancer Paternal Uncle   . Colon cancer Maternal Grandfather   . Colon cancer Paternal Grandfather   . Prostate cancer Father   . Stomach cancer Neg Hx   . Esophageal cancer Neg Hx    Social History   Socioeconomic History  . Marital status: Married    Spouse name: Not on file  . Number of children: Not on file  . Years of education: Not on file  . Highest education level: Not on file  Occupational History    Employer: [employer]  Social Needs  . Financial resource strain: Not on file  . Food insecurity    Worry: Not on file    Inability: Not on file  . Transportation needs    Medical: Not on file    Non-medical: Not on file  Tobacco Use  . Smoking status: Former Smoker    Quit date: 09/20/1989    Years since quitting: 29.5  . Smokeless tobacco: Never Used  Substance and Sexual Activity  . Alcohol use: No    Alcohol/week: 0.0 standard drinks  . Drug use: No  . Sexual activity: Yes    Partners: Female  Lifestyle  . Physical activity    Days per week: Not on file    Minutes per session: Not on file  . Stress: Not on file  Relationships  . Social Herbalist on phone: Not on file    Gets together: Not on file    Attends religious service: Not on file    Active member of club or organization: Not on file    Attends  meetings of clubs or organizations: Not on file    Relationship status: Not on file  . Intimate partner violence    Fear of current or ex partner: Not on file    Emotionally abused: Not on file    Physically abused: Not on file    Forced sexual activity: Not on file  Other Topics Concern  . Not on file  Social History Narrative  . Not on file  ROS Constitutional: Denies fever, chills, weight loss/gain, headaches, insomnia,  night sweats or change in appetite. Does c/o fatigue. Eyes: Denies redness, blurred vision, diplopia, discharge, itchy or watery eyes.  ENT: Denies discharge, congestion, post nasal drip, epistaxis, sore throat, earache, hearing loss, dental pain, Tinnitus, Vertigo, Sinus pain or snoring.  Cardio: Denies chest pain, palpitations, irregular heartbeat, syncope, dyspnea, diaphoresis, orthopnea, PND, claudication or edema Respiratory: denies cough, dyspnea, DOE, pleurisy, hoarseness, laryngitis or wheezing.  Gastrointestinal: Denies dysphagia, heartburn, reflux, water brash, pain, cramps, nausea, vomiting, bloating, diarrhea, constipation, hematemesis, melena, hematochezia, jaundice or hemorrhoids Genitourinary: Denies dysuria, frequency, urgency, nocturia, hesitancy, discharge, hematuria or flank pain Musculoskeletal: Denies arthralgia, myalgia, stiffness, Jt. Swelling, pain, limp or strain/sprain. Denies Falls. Skin: Denies puritis, rash, hives, warts, acne, eczema or change in skin lesion Neuro: No weakness, tremor, incoordination, spasms, paresthesia or pain Psychiatric: Denies confusion, memory loss or sensory loss. Denies Depression. Endocrine: Denies change in weight, skin, hair change, nocturia, and paresthesia, diabetic polys, visual blurring or hyper / hypo glycemic episodes.  Heme/Lymph: No excessive bleeding, bruising or enlarged lymph nodes.  Physical Exam  BP 130/82   Pulse 84   Temp (!) 97 F (36.1 C)   Resp 16   Ht 6' 0.05" (1.83 m)   Wt 256 lb  12.8 oz (116.5 kg)   BMI 34.78 kg/m   General Appearance: Well nourished and well groomed and in no apparent distress.  Eyes: PERRLA, EOMs, conjunctiva no swelling or erythema, normal fundi and vessels. Sinuses: No frontal/maxillary tenderness ENT/Mouth: EACs patent / TMs  nl. Nares clear without erythema, swelling, mucoid exudates. Oral hygiene is good. No erythema, swelling, or exudate. Tongue normal, non-obstructing. Tonsils not swollen or erythematous. Hearing normal.  Neck: Supple, thyroid not palpable. No bruits, nodes or JVD. Respiratory: Respiratory effort normal.  BS equal and clear bilateral without rales, rhonci, wheezing or stridor. Cardio: Heart sounds are normal with regular rate and rhythm and no murmurs, rubs or gallops. Peripheral pulses are normal and equal bilaterally without edema. No aortic or femoral bruits. Chest: symmetric with normal excursions and percussion.  Abdomen: Soft, with Nl bowel sounds. Nontender, no guarding, rebound, hernias, masses, or organomegaly.  Lymphatics: Non tender without lymphadenopathy.  Musculoskeletal: Full ROM all peripheral extremities, joint stability, 5/5 strength, and normal gait. Skin: Warm and dry without rashes, lesions, cyanosis, clubbing or  ecchymosis.  Neuro: Cranial nerves intact, reflexes equal bilaterally. Normal muscle tone, no cerebellar symptoms. Sensation intact.  Pysch: Alert and oriented X 3 with normal affect, insight and judgment appropriate.   Assessment and Plan  1. Annual Preventative/Screening Exam   2. Essential hypertension  - EKG 12-Lead - [place], RETROPERITNL ABD,  LTD - Urinalysis, Routine w reflex microscopic - Microalbumin / creatinine urine ratio - CBC with Differential/Platelet - COMPLETE METABOLIC PANEL WITH GFR - Magnesium - TSH  3. Hyperlipidemia, mixed  - EKG 12-Lead - [place], RETROPERITNL ABD,  LTD - Lipid panel - TSH  4. Abnormal glucose  - EKG 12-Lead - [place], RETROPERITNL ABD,  LTD -  Hemoglobin A1c  5. Vitamin D deficiency  - VITAMIN D 25 Hydroxyl  6. PreDiabetes  - EKG 12-Lead - [place], RETROPERITNL ABD,  LTD - Hemoglobin A1c - Insulin, random  7. Testosterone Deficiency  - Testosterone  8. OSA on CPAP   9. Class 2 severe obesity due to excess calories with serious comorbidity and body mass index (BMI) of 35.0 to 35.9 in adult ([place])   10. Trigeminal neuralgia of right side  of face   11. Screening-pulmonary TB  - TB Skin Test  12. Screening for colorectal cancer  - POC Hemoccult Bld/Stl  13. Prostate cancer screening  - PSA  14. Screening for ischemic heart disease  - EKG 12-Lead - Lipid panel  15. FH: hypertension  - EKG 12-Lead - [place], RETROPERITNL ABD,  LTD  16. Former smoker  - EKG 12-Lead - [place], RETROPERITNL ABD,  LTD  17. Screening for AAA (aortic abdominal aneurysm)  - [place], RETROPERITNL ABD,  LTD  18. Fatigue, unspecified type  - Iron,Total/Total Iron Binding Cap - Vitamin B12 - Testosterone  19. Medication management  - Urinalysis, Routine w reflex microscopic - Microalbumin / creatinine urine ratio - CBC with Differential/Platelet - COMPLETE METABOLIC PANEL WITH GFR - Magnesium - Lipid panel - TSH - Hemoglobin A1c - Insulin, random - VITAMIN D 25 Hydroxyl        Patient was counseled in prudent diet, weight control to achieve/maintain BMI less than 25, BP monitoring, regular exercise and medications as discussed.  Discussed med effects and SE's. Routine screening labs and tests as requested with regular follow-up as recommended. Over 40 minutes of exam, counseling, chart review and high complex critical decision making was performed   Kirtland Bouchard, MD

## 2019-04-18 ENCOUNTER — Ambulatory Visit: Payer: BC Managed Care – PPO | Admitting: Internal Medicine

## 2019-04-18 ENCOUNTER — Other Ambulatory Visit: Payer: Self-pay | Admitting: Internal Medicine

## 2019-04-18 ENCOUNTER — Other Ambulatory Visit: Payer: Self-pay

## 2019-04-18 VITALS — BP 130/82 | HR 84 | Temp 97.0°F | Resp 16 | Ht 72.05 in | Wt 256.8 lb

## 2019-04-18 DIAGNOSIS — I10 Essential (primary) hypertension: Secondary | ICD-10-CM

## 2019-04-18 DIAGNOSIS — Z0000 Encounter for general adult medical examination without abnormal findings: Secondary | ICD-10-CM

## 2019-04-18 DIAGNOSIS — Z111 Encounter for screening for respiratory tuberculosis: Secondary | ICD-10-CM

## 2019-04-18 DIAGNOSIS — R350 Frequency of micturition: Secondary | ICD-10-CM

## 2019-04-18 DIAGNOSIS — Z8249 Family history of ischemic heart disease and other diseases of the circulatory system: Secondary | ICD-10-CM | POA: Diagnosis not present

## 2019-04-18 DIAGNOSIS — Z1389 Encounter for screening for other disorder: Secondary | ICD-10-CM

## 2019-04-18 DIAGNOSIS — Z136 Encounter for screening for cardiovascular disorders: Secondary | ICD-10-CM | POA: Diagnosis not present

## 2019-04-18 DIAGNOSIS — Z79899 Other long term (current) drug therapy: Secondary | ICD-10-CM | POA: Diagnosis not present

## 2019-04-18 DIAGNOSIS — E559 Vitamin D deficiency, unspecified: Secondary | ICD-10-CM | POA: Diagnosis not present

## 2019-04-18 DIAGNOSIS — N401 Enlarged prostate with lower urinary tract symptoms: Secondary | ICD-10-CM | POA: Diagnosis not present

## 2019-04-18 DIAGNOSIS — Z131 Encounter for screening for diabetes mellitus: Secondary | ICD-10-CM

## 2019-04-18 DIAGNOSIS — Z1211 Encounter for screening for malignant neoplasm of colon: Secondary | ICD-10-CM

## 2019-04-18 DIAGNOSIS — R7309 Other abnormal glucose: Secondary | ICD-10-CM

## 2019-04-18 DIAGNOSIS — Z130 Encounter for screening for diseases of the blood and blood-forming organs and certain disorders involving the immune mechanism: Secondary | ICD-10-CM | POA: Diagnosis not present

## 2019-04-18 DIAGNOSIS — Z0001 Encounter for general adult medical examination with abnormal findings: Secondary | ICD-10-CM

## 2019-04-18 DIAGNOSIS — G4733 Obstructive sleep apnea (adult) (pediatric): Secondary | ICD-10-CM

## 2019-04-18 DIAGNOSIS — E291 Testicular hypofunction: Secondary | ICD-10-CM

## 2019-04-18 DIAGNOSIS — Z13220 Encounter for screening for lipoid disorders: Secondary | ICD-10-CM

## 2019-04-18 DIAGNOSIS — Z87891 Personal history of nicotine dependence: Secondary | ICD-10-CM

## 2019-04-18 DIAGNOSIS — Z6835 Body mass index (BMI) 35.0-35.9, adult: Secondary | ICD-10-CM

## 2019-04-18 DIAGNOSIS — R5383 Other fatigue: Secondary | ICD-10-CM

## 2019-04-18 DIAGNOSIS — Z125 Encounter for screening for malignant neoplasm of prostate: Secondary | ICD-10-CM | POA: Diagnosis not present

## 2019-04-18 DIAGNOSIS — Z1329 Encounter for screening for other suspected endocrine disorder: Secondary | ICD-10-CM

## 2019-04-18 DIAGNOSIS — Z9989 Dependence on other enabling machines and devices: Secondary | ICD-10-CM

## 2019-04-18 DIAGNOSIS — G500 Trigeminal neuralgia: Secondary | ICD-10-CM

## 2019-04-18 DIAGNOSIS — E782 Mixed hyperlipidemia: Secondary | ICD-10-CM

## 2019-04-19 ENCOUNTER — Other Ambulatory Visit: Payer: Self-pay | Admitting: Internal Medicine

## 2019-04-19 DIAGNOSIS — R972 Elevated prostate specific antigen [PSA]: Secondary | ICD-10-CM

## 2019-04-19 LAB — MICROALBUMIN / CREATININE URINE RATIO
Creatinine, Urine: 84 mg/dL (ref 20–320)
Microalb Creat Ratio: 2 mcg/mg creat (ref <30)
Microalb, Ur: 0.2 mg/dL

## 2019-04-19 LAB — COMPLETE METABOLIC PANEL WITH GFR
AG Ratio: 1.7 (calc) (ref 1.0–2.5)
ALT: 29 U/L (ref 9–46)
AST: 34 U/L (ref 10–35)
Albumin: 4.6 g/dL (ref 3.6–5.1)
Alkaline phosphatase (APISO): 61 U/L (ref 35–144)
BUN: 13 mg/dL (ref 7–25)
CO2: 30 mmol/L (ref 20–32)
Calcium: 9.7 mg/dL (ref 8.6–10.3)
Chloride: 98 mmol/L (ref 98–110)
Creat: 1.16 mg/dL (ref 0.70–1.33)
GFR, Est African American: 82 mL/min/{1.73_m2} (ref >=60)
GFR, Est Non African American: 70 mL/min/{1.73_m2} (ref >=60)
Globulin: 2.7 g/dL (calc) (ref 1.9–3.7)
Glucose, Bld: 111 mg/dL — ABNORMAL HIGH (ref 65–99)
Potassium: 4.7 mmol/L (ref 3.5–5.3)
Sodium: 136 mmol/L (ref 135–146)
Total Bilirubin: 0.5 mg/dL (ref 0.2–1.2)
Total Protein: 7.3 g/dL (ref 6.1–8.1)

## 2019-04-19 LAB — CBC WITH DIFFERENTIAL/PLATELET
Absolute Monocytes: 714 cells/uL (ref 200–950)
Basophils Absolute: 42 cells/uL (ref 0–200)
Basophils Relative: 0.9 %
Eosinophils Absolute: 179 cells/uL (ref 15–500)
Eosinophils Relative: 3.8 %
HCT: 38 % — ABNORMAL LOW (ref 38.5–50.0)
Hemoglobin: 12.7 g/dL — ABNORMAL LOW (ref 13.2–17.1)
Lymphs Abs: 1246 cells/uL (ref 850–3900)
MCH: 28.8 pg (ref 27.0–33.0)
MCHC: 33.4 g/dL (ref 32.0–36.0)
MCV: 86.2 fL (ref 80.0–100.0)
MPV: 9.2 fL (ref 7.5–12.5)
Monocytes Relative: 15.2 %
Neutro Abs: 2519 cells/uL (ref 1500–7800)
Neutrophils Relative %: 53.6 %
Platelets: 310 10*3/uL (ref 140–400)
RBC: 4.41 10*6/uL (ref 4.20–5.80)
RDW: 14.5 % (ref 11.0–15.0)
Total Lymphocyte: 26.5 %
WBC: 4.7 10*3/uL (ref 3.8–10.8)

## 2019-04-19 LAB — VITAMIN D 25 HYDROXY (VIT D DEFICIENCY, FRACTURES): Vit D, 25-Hydroxy: 84 ng/mL (ref 30–100)

## 2019-04-19 LAB — URINALYSIS, ROUTINE W REFLEX MICROSCOPIC
Bilirubin Urine: NEGATIVE
Glucose, UA: NEGATIVE
Hgb urine dipstick: NEGATIVE
Ketones, ur: NEGATIVE
Leukocytes,Ua: NEGATIVE
Nitrite: NEGATIVE
Protein, ur: NEGATIVE
Specific Gravity, Urine: 1.012 (ref 1.001–1.03)
pH: 7 (ref 5.0–8.0)

## 2019-04-19 LAB — IRON, TOTAL/TOTAL IRON BINDING CAP
%SAT: 13 % (calc) — ABNORMAL LOW (ref 20–48)
Iron: 50 ug/dL (ref 50–180)
TIBC: 376 mcg/dL (calc) (ref 250–425)

## 2019-04-19 LAB — LIPID PANEL
Cholesterol: 181 mg/dL (ref <200)
HDL: 43 mg/dL (ref >=40)
LDL Cholesterol (Calc): 111 mg/dL (calc) — ABNORMAL HIGH
Non-HDL Cholesterol (Calc): 138 mg/dL (calc) — ABNORMAL HIGH (ref <130)
Total CHOL/HDL Ratio: 4.2 (calc) (ref <5.0)
Triglycerides: 155 mg/dL — ABNORMAL HIGH (ref <150)

## 2019-04-19 LAB — VITAMIN B12: Vitamin B-12: 861 pg/mL (ref 200–1100)

## 2019-04-19 LAB — HEMOGLOBIN A1C
Hgb A1c MFr Bld: 6.1 % of total Hgb — ABNORMAL HIGH (ref <5.7)
Mean Plasma Glucose: 128 (calc)
eAG (mmol/L): 7.1 (calc)

## 2019-04-19 LAB — TSH: TSH: 1.49 mIU/L (ref 0.40–4.50)

## 2019-04-19 LAB — INSULIN, RANDOM: Insulin: 26.8 u[IU]/mL — ABNORMAL HIGH

## 2019-04-19 LAB — MAGNESIUM: Magnesium: 2.4 mg/dL (ref 1.5–2.5)

## 2019-04-19 LAB — PSA: PSA: 4.6 ng/mL — ABNORMAL HIGH (ref <=4.0)

## 2019-04-19 LAB — TESTOSTERONE: Testosterone: 509 ng/dL (ref 250–827)

## 2019-04-30 ENCOUNTER — Other Ambulatory Visit: Payer: Self-pay

## 2019-04-30 ENCOUNTER — Other Ambulatory Visit: Payer: BC Managed Care – PPO

## 2019-04-30 DIAGNOSIS — Z125 Encounter for screening for malignant neoplasm of prostate: Secondary | ICD-10-CM

## 2019-04-30 DIAGNOSIS — R350 Frequency of micturition: Secondary | ICD-10-CM

## 2019-04-30 DIAGNOSIS — R972 Elevated prostate specific antigen [PSA]: Secondary | ICD-10-CM

## 2019-04-30 DIAGNOSIS — N401 Enlarged prostate with lower urinary tract symptoms: Secondary | ICD-10-CM

## 2019-05-01 LAB — PSA, TOTAL AND FREE
PSA, % Free: 22 % (calc) — ABNORMAL LOW (ref >25)
PSA, Free: 0.2 ng/mL
PSA, Total: 0.9 ng/mL (ref <=4.0)

## 2019-05-03 ENCOUNTER — Other Ambulatory Visit: Payer: Self-pay | Admitting: Neurology

## 2019-05-09 ENCOUNTER — Other Ambulatory Visit: Payer: Self-pay | Admitting: Internal Medicine

## 2019-05-09 MED ORDER — PREDNISONE 20 MG PO TABS: ORAL_TABLET | ORAL | 0 refills | Status: DC

## 2019-07-10 ENCOUNTER — Other Ambulatory Visit: Payer: Self-pay | Admitting: Internal Medicine

## 2019-07-19 NOTE — Progress Notes (Signed)
FOLLOW UP  Assessment and Plan:   Hypertension Well controlled with current medications  Monitor blood pressure at home; patient to call if consistently greater than 130/80 Continue DASH diet.   Reminder to go to the ER if any CP, SOB, nausea, dizziness, severe HA, changes vision/speech, left arm numbness and tingling and jaw pain.  Cholesterol Currently mildly above LDL goal with red yeast rice  Continue low cholesterol diet and exercise.  Check lipid panel.   Prediabetes Continue diet and exercise.  Perform daily foot/skin check, notify office of any concerning changes.  He requests defer A1C; monitor weight, serum glucose   CKD II Increase fluids, avoid NSAIDS, monitor sugars, will monitor CMP/BMP/GFR  Obesity with co morbidities Long discussion about weight loss, diet, and exercise Recommended diet heavy in fruits and veggies and low in animal meats, cheeses, and dairy products, appropriate calorie intake Discussed ideal weight for height, goal weight 225lb to begin Patient on phentermine with benefit and no SE, reminded to take drug breaks; continue close follow up.  Recommended he weigh once weekly and keep a log Somewhat ambivalent about aggressive changes  Will follow up in 3 months  Vitamin D Def At goal at last visit; continue supplementation for goal of 60-100 Defer Vit D level  Hx of Hypogonadism Was WNL at recent check;  Continues with sildenafil 50 mg PRN  Elevated PSA/family history of prostate cancer Jumped up significantly at CPE this year; recheck today  Refer to urology if remains elevated   Need for influenza vaccine - quadrivalent administered without complication  Bilateral knee pain Established with ortho; sent in meloxicam 7.5 -15 mg daily PRN to try on days he works  [employer] and meds as discussed. Further disposition pending results of labs. Discussed med's effects and SE's.   Over 30 minutes of exam, counseling, chart review, and  critical decision making was performed.   Future Appointments  Date Time Provider [place]  08/20/2019  8:50 AM Pieter Partridge, DO [facility] None  10/23/2019  4:30 PM [name], MD [facility] None  05/23/2020 10:00 AM [name], MD [facility] None    ----------------------------------------------------------------------------------------------------------------------  HPI 53 y.o. male  presents for 3 month follow up on hypertension, cholesterol, prediabetes, obesity, hypogonadism and vitamin D deficiency.   He is on oxcarbazepine per neurology for trigeminal neuralgia; reports recently well controlled; he has mild persistent hyponatremia attributed to this which has been stable.   he is prescribed phentermine for weight loss.  While on the medication they have lost 6 lbs since last visit. They deny palpitations, anxiety, trouble sleeping, elevated BP.   BMI is Body mass index is 33.09 kg/m., he is working on diet and exercise, works very active job, walks on the weekend with wife, will resume [employer] once covid 19 has improved  Recommended he try HIIT instead of steady state cardio, increase weights/resistance activity Continue working on portion sizes, he feels heeds to cut down on starches  Wt Readings from Last 3 Encounters:  07/23/19 250 lb 12.8 oz (113.8 kg)  04/18/19 256 lb 12.8 oz (116.5 kg)  01/15/19 254 lb (115.2 kg)   Typical breakfast: couple eggs AM snack: grapes/fruit Typical lunch: Microwave meals (healthy choice, looks at calories and fat content) Typical dinner: Whatever wife cooks, but cutting down on portion, will get a salad Exercise: was going to the gym 3 times a week, now walking, averages 3 miles daily  Water intake: has decreased to 2 bottles of diet  soda, has increased water, 2-3 bottles  His blood pressure has been controlled at home, today their BP is BP: 124/76  He does workout. He denies chest pain, shortness of breath,  dizziness.   He is not on cholesterol medication (on red yeast rice supplement only) and denies myalgias. His LDL/total cholesterol cholesterol is not at goal of <100. The cholesterol last visit was:   Lab Results  Component Value Date   CHOL 181 04/18/2019   HDL 43 04/18/2019   LDLCALC 111 (H) 04/18/2019   TRIG 155 (H) 04/18/2019   CHOLHDL 4.2 04/18/2019    He has been working on diet and exercise for prediabetes, and denies foot ulcerations, increased appetite, nausea, paresthesia of the feet, polydipsia, polyuria, visual disturbances, vomiting and weight loss. Last A1C in the office was:  Lab Results  Component Value Date   HGBA1C 6.1 (H) 04/18/2019   Lab Results  Component Value Date   GFRNONAA 70 04/18/2019   Patient is on Vitamin D supplement and at goal at recent check:    Lab Results  Component Value Date   VD25OH 84 04/18/2019     He has a history of testosterone deficiency and was on testosterone replacement with perceived benefit, however at last check was at goal off of supplement and has not been restarted. He is prescribed sildenafil 100 mg tabs, taking 1/2 tab when needed but not using much.  Lab Results  Component Value Date   TESTOSTERONE 509 04/18/2019    Lab Results  Component Value Date   PSA 4.6 (H) 04/18/2019   PSA 0.9 03/20/2018   PSA 0.8 02/03/2017    Current Medications:  Current Outpatient Medications on File Prior to Visit  Medication Sig  . aspirin 81 MG chewable tablet Chew 1 tablet (81 mg total) by mouth daily.  . Cholecalciferol (VITAMIN D PO) Take 10,000 Units by mouth daily.   Marland Kitchen diltiazem (CARDIZEM) 120 MG tablet Take 1 tablet by mouth once daily for blood pressure  . fluticasone (FLONASE) 50 MCG/ACT nasal spray USE ONE SPRAY EACH NOSTRIL TWICE DAILY  . Lecithin 1200 MG CAPS Take by mouth daily.  Marland Kitchen lisinopril (ZESTRIL) 40 MG tablet Take 1 tablet Daily for BP  . Magnesium 250 MG TABS Take 500 mg by mouth 2 (two) times a day.   . Multiple  Vitamins-Minerals (MULTIVITAMIN MEN PO) Take by mouth daily.  . Omega-3 Fatty Acids (FISH OIL) 1000 MG CAPS Take 1,200 mg by mouth. Take two tablets in the morning and two tablets in the evening  . oxcarbazepine (TRILEPTAL) 600 MG tablet Take 1 tablet by mouth twice daily  . phentermine (ADIPEX-P) 37.5 MG tablet Take 1 tablet every Morning for Dieting & Weight Loss  . Red Yeast Rice Extract (RED YEAST RICE PO) Take 600 mg by mouth 2 (two) times daily.   . sildenafil (VIAGRA) 100 MG tablet 1/2-1 pill as needed 1 hour before intercourse, can get with good RX from [employer]  . SYRINGE-NEEDLE, DISP, 3 ML (B-D 3CC LUER-LOK SYR 21GX1") 21G X 1" 3 ML MISC USE AS DIRECTED  . valACYclovir (VALTREX) 1000 MG tablet Take 1 tablet daily prophylaxis for fever blisters (Patient taking differently: as needed. Take 1 tablet daily prophylaxis for fever blisters)  . triamcinolone ointment (KENALOG) 0.5 % Apply 1 application topically 2 (two) times daily. Apply to rash 2 to 3 x/day (Patient not taking: Reported on 07/23/2019)   No current facility-administered medications on file prior to visit.  Allergies:  Allergies  Allergen Reactions  . Toprol Xl [Metoprolol Tartrate]     ED     Medical History:  Past Medical History:  Diagnosis Date  . Allergy   . Arthritis   . Elevated hemoglobin A1c   . Elevated LDL cholesterol level   . Hx of colonic polyp 11/20/2014  . Hypertension   . Hypogonadism male   . Neuromuscular disorder (HCC)    Trigeminar neuralgia  . Sleep apnea    wears CPAP  . Vitamin D deficiency    Family history- Reviewed and unchanged Social history- Reviewed and unchanged   Review of Systems:  Review of Systems  Constitutional: Negative for malaise/fatigue and weight loss.  HENT: Negative for hearing loss and tinnitus.   Eyes: Negative for blurred vision and double vision.  Respiratory: Negative for cough, shortness of breath and wheezing.   Cardiovascular: Negative for chest  pain, palpitations, orthopnea, claudication and leg swelling.  Gastrointestinal: Negative for abdominal pain, blood in stool, constipation, diarrhea, heartburn, melena, nausea and vomiting.  Genitourinary: Negative.   Musculoskeletal: Positive for joint pain (bil knee pain intermittently ). Negative for myalgias.  Skin: Negative for rash.  Neurological: Negative for dizziness, tingling, sensory change, weakness and headaches.  Endo/Heme/Allergies: Negative for polydipsia.  Psychiatric/Behavioral: Negative.   All other systems reviewed and are negative.   Physical Exam: BP 124/76   Pulse 92   Temp (!) 97.3 F (36.3 C)   Ht 6\' 1"  (1.854 m)   Wt 250 lb 12.8 oz (113.8 kg)   SpO2 96%   BMI 33.09 kg/m  Wt Readings from Last 3 Encounters:  07/23/19 250 lb 12.8 oz (113.8 kg)  04/18/19 256 lb 12.8 oz (116.5 kg)  01/15/19 254 lb (115.2 kg)   General Appearance: Well nourished, in no apparent distress. Eyes: PERRLA, EOMs, conjunctiva no swelling or erythema Sinuses: No Frontal/maxillary tenderness ENT/Mouth: Ext aud canals clear, TMs without erythema, bulging. No erythema, swelling, or exudate on post pharynx.  Tonsils not swollen or erythematous. Hearing normal.  Neck: Supple, thyroid normal.  Respiratory: Respiratory effort normal, BS equal bilaterally without rales, rhonchi, wheezing or stridor.  Cardio: RRR with no MRGs. Brisk peripheral pulses without edema.  Abdomen: Soft, + BS.  Non tender, no guarding, rebound, hernias, masses. Lymphatics: Non tender without lymphadenopathy.  Musculoskeletal: Full ROM, 5/5 strength, Normal gait.  Skin: Warm, dry without rashes, lesions, ecchymosis.  Neuro: Cranial nerves intact. No cerebellar symptoms.  Psych: Awake and oriented X 3, normal affect, Insight and Judgment appropriate.    Izora Ribas, NP 5:09 PM [facility]

## 2019-07-23 ENCOUNTER — Encounter: Payer: Self-pay | Admitting: Adult Health

## 2019-07-23 ENCOUNTER — Ambulatory Visit: Payer: BC Managed Care – PPO | Admitting: Adult Health

## 2019-07-23 ENCOUNTER — Other Ambulatory Visit: Payer: Self-pay

## 2019-07-23 VITALS — BP 124/76 | HR 92 | Temp 97.3°F | Ht 73.0 in | Wt 250.8 lb

## 2019-07-23 DIAGNOSIS — N182 Chronic kidney disease, stage 2 (mild): Secondary | ICD-10-CM

## 2019-07-23 DIAGNOSIS — E669 Obesity, unspecified: Secondary | ICD-10-CM

## 2019-07-23 DIAGNOSIS — R7309 Other abnormal glucose: Secondary | ICD-10-CM

## 2019-07-23 DIAGNOSIS — R972 Elevated prostate specific antigen [PSA]: Secondary | ICD-10-CM | POA: Diagnosis not present

## 2019-07-23 DIAGNOSIS — E782 Mixed hyperlipidemia: Secondary | ICD-10-CM

## 2019-07-23 DIAGNOSIS — Z23 Encounter for immunization: Secondary | ICD-10-CM | POA: Diagnosis not present

## 2019-07-23 DIAGNOSIS — Z79899 Other long term (current) drug therapy: Secondary | ICD-10-CM | POA: Diagnosis not present

## 2019-07-23 DIAGNOSIS — E559 Vitamin D deficiency, unspecified: Secondary | ICD-10-CM

## 2019-07-23 DIAGNOSIS — I10 Essential (primary) hypertension: Secondary | ICD-10-CM | POA: Diagnosis not present

## 2019-07-23 DIAGNOSIS — Z8042 Family history of malignant neoplasm of prostate: Secondary | ICD-10-CM

## 2019-07-23 MED ORDER — MELOXICAM 15 MG PO TABS: ORAL_TABLET | ORAL | 1 refills | Status: DC

## 2019-07-23 NOTE — Patient Instructions (Addendum)
Goals    . Exercise 150 min/wk Moderate Activity     Try HIIT (high intensity interval training) instead of steady state cardio Focus on weights/resistance exercise for better basal metabolic benefits    . LDL CALC < 100    . Weight (lb) < 225 lb (102.1 kg)      Try to take regular breaks from phentermine - works better   Weight once a week and keep a log -   Try meloxicam for knees - 1/2- 1 tab once daily - take with food or milk, not on empty stomach. Don't take with ibuprofen or aleve, but ok to take tylenol if needed    8 Critical Weight-Loss Tips That Aren't Diet and Exercise  1. STARVE THE DISTRACTIONS  All too often when we eat, we're also multitasking: watching TV, answering emails, scrolling through social media. These habits are detrimental to having a strong, clear, healthy relationship with food, and they can hinder our ability to make dietary changes.  In order to truly focus on what you're eating, how much you're eating, why you're eating those specific foods and, most importantly, how those foods make you feel, you need to starve the distractions. That means when you eat, just eat. Focus on your food, the process it went through to end up on your plate, where it came from and how it nourishes you. With this technique, you're more likely to finish a meal feeling satiated.  2.  CONSIDER WHAT YOU'RE NOT WILLING TO DO  This might sound counterintuitive, but it can help provide a "why" when motivation is waning. Declare, in writing, what you are unwilling to do, for example "I am unwilling to be the old dad who cannot play sports with my children".  So consider what you're not willing to accept, write it down, and keep it at the ready.  3.  STOP LABELING FOOD "GOOD" AND "BAD"  You've probably heard someone say they ate something "bad." Maybe you've even said it yourself.  The trouble with 'bad' foods isn't that they'll send you to the grave after a bite or two. The  trouble comes when we eat excessive portions of really calorie-dense foods meal after meal, day after day.  Instead of labeling foods as good or bad, think about which foods you can eat a lot of, and which ones you should just eat a little of. Then, plan ways to eat the foods you really like in portions that fit with your overall goals. A good example of this would be having a slice of pizza alongside a club salad with chicken breast, avocado and a bit of dressing. This is vastly different than 3 slices of pizza, 4 breadsticks with cheese sauce and half of a liter of regular soda.  4.  BRUSH YOUR TEETH AFTER YOU EAT  Getting your mindset in order is important, but sometimes small habits can make a big difference. After eating, you still have the taste of food in their mouth, which often causes people to eat more even if they are full or engage in a nibble or two of dessert.  Brushing your teeth will remove the taste of food from your mouth, and the clean, minty freshness will serve as a cue that mealtime is over.  5.  FOCUS ON CROWDING NOT CUTTING  The most common first step during 'dieting' is to cut. We cut our portion sizes down, we cut out 'bad' foods, we cut out entire food groups. This  act of cutting puts [place] and our minds into scarcity mode.  When something is off-limits, even if you're able to avoid it for a while, you could end up bingeing on it later because you've gone so long without it. So, instead of cutting, focus on crowding. If you crowd your plate and fill it up with more foods like veggies and protein, it simply allows less room for the other stuff. In other words, shift your focus away from what you can't eat, and celebrate the foods that will help you reach your goals.  6.  TAKE TRACKING A STEP FURTHER  Track what you eat, when you ate it, how much you ate and how that food made you feel. Being completely honest with yourself and writing down every single thing that passes through  your lips will help you start to notice that maybe you actually do snack, possibly take in more sugar than you thought, eat when you're bored rather than just hungry or maybe that you have a habit of snacking before bed while watching TV.  The difference from simply tracking your food intake is you're taking into account how food makes you feel, as well as what you're doing while you're eating. This is about becoming more mindful of what, when and why you eat.  7.  PRIORITIZE GOOD SLEEP  One of the strongest risk factors for being overweight is poor sleep. When you're feeling tired, you're more likely to choose unhealthy comfort foods and to skip your workout. Additionally, sleep deprivation may slow down your metabolism. [employer]! Therefore, sleeping 7-8 hours per night can help with weight loss without having to change your diet or increase your physical activity. And if you feel you snore and still wake up tired, talk with me about sleep apnea.  8.  SET ASIDE TIME TO DISCONNECT  Just get out there. Disconnect from the electronics and connect to the elements. Not only will this help reduce stress (a major factor in weight gain) by giving your mind a break from the constant stimulation we've all become so accustomed to, but it may also reprogram your brain to connect with yourself and what you're feeling.       Meloxicam tablets What is this medicine? MELOXICAM (mel OX i cam) is a non-steroidal anti-inflammatory drug (NSAID). It is used to reduce swelling and to treat pain. It may be used for osteoarthritis, rheumatoid arthritis, or juvenile rheumatoid arthritis. This medicine may be used for other purposes; ask your health care provider or pharmacist if you have questions. COMMON BRAND NAME(S): Mobic What should I tell my health care provider before I take this medicine? They need to know if you have any of these conditions:  bleeding disorders  cigarette smoker  coronary artery bypass  graft (CABG) surgery within the past 2 weeks  drink more than 3 alcohol-containing drinks per day  heart disease  high blood pressure  history of stomach bleeding  kidney disease  liver disease  lung or breathing disease, like asthma  stomach or intestine problems  an unusual or allergic reaction to meloxicam, aspirin, other NSAIDs, other medicines, foods, dyes, or preservatives  pregnant or trying to get pregnant  breast-feeding How should I use this medicine? Take this medicine by mouth with a full glass of water. Follow the directions on the prescription label. You can take it with or without food. If it upsets your stomach, take it with food. Take your medicine at regular intervals. Do not take  it more often than directed. Do not stop taking except on your doctor's advice. A special MedGuide will be given to you by the pharmacist with each prescription and refill. Be sure to read this information carefully each time. Talk to your pediatrician regarding the use of this medicine in children. While this drug may be prescribed for selected conditions, precautions do apply. Patients over 101 years old may have a stronger reaction and need a smaller dose. Overdosage: If you think you have taken too much of this medicine contact a poison control center or emergency room at once. NOTE: This medicine is only for you. Do not share this medicine with others. What if I miss a dose? If you miss a dose, take it as soon as you can. If it is almost time for your next dose, take only that dose. Do not take double or extra doses. What may interact with this medicine? Do not take this medicine with any of the following medications:  cidofovir  ketorolac This medicine may also interact with the following medications:  aspirin and aspirin-like medicines  certain medicines for blood pressure, heart disease, irregular heart beat  certain medicines for depression, anxiety, or psychotic  disturbances  certain medicines that treat or prevent blood clots like warfarin, enoxaparin, dalteparin, apixaban, dabigatran, rivaroxaban  cyclosporine  diuretics  fluconazole  lithium  methotrexate  other NSAIDs, medicines for pain and inflammation, like ibuprofen and naproxen  pemetrexed This list may not describe all possible interactions. Give your health care provider a list of all the medicines, herbs, non-prescription drugs, or dietary supplements you use. Also tell them if you smoke, drink alcohol, or use illegal drugs. Some items may interact with your medicine. What should I watch for while using this medicine? Tell your doctor or healthcare provider if your symptoms do not start to get better or if they get worse. This medicine may cause serious skin reactions. They can happen weeks to months after starting the medicine. Contact your healthcare provider right away if you notice fevers or flu-like symptoms with a rash. The rash may be red or purple and then turn into blisters or peeling of the skin. Or, you might notice a red rash with swelling of the face, lips or lymph nodes in your neck or under your arms. Do not take other medicines that contain aspirin, ibuprofen, or naproxen with this medicine. Side effects such as stomach upset, nausea, or ulcers may be more likely to occur. Many medicines available without a prescription should not be taken with this medicine. This medicine can cause ulcers and bleeding in the stomach and intestines at any time during treatment. This can happen with no warning and may cause death. There is increased risk with taking this medicine for a long time. Smoking, drinking alcohol, older age, and poor health can also increase risks. Call your doctor right away if you have stomach pain or blood in your vomit or stool. This medicine does not prevent heart attack or stroke. In fact, this medicine may increase the chance of a heart attack or stroke. The  chance may increase with longer use of this medicine and in people who have heart disease. If you take aspirin to prevent heart attack or stroke, talk with your doctor or healthcare provider. What side effects may I notice from receiving this medicine? Side effects that you should report to your doctor or health care professional as soon as possible:  allergic reactions like skin rash, itching or hives,  swelling of the face, lips, or tongue  nausea, vomiting  redness, blistering, peeling, or loosening of the skin, including inside the mouth  signs and symptoms of a blood clot such as breathing problems; changes in vision; chest pain; severe, sudden headache; pain, swelling, warmth in the leg; trouble speaking; sudden numbness or weakness of the face, arm, or leg  signs and symptoms of bleeding such as bloody or black, tarry stools; red or dark-brown urine; spitting up blood or brown material that looks like coffee grounds; red spots on the skin; unusual bruising or bleeding from the eye, gums, or nose  signs and symptoms of liver injury like dark yellow or brown urine; general ill feeling or flu-like symptoms; light-colored stools; loss of appetite; nausea; right upper belly pain; unusually weak or tired; yellowing of the eyes or skin  signs and symptoms of stroke like changes in vision; confusion; trouble speaking or understanding; severe headaches; sudden numbness or weakness of the face, arm, or leg; trouble walking; dizziness; loss of balance or coordination Side effects that usually do not require medical attention (report to your doctor or health care professional if they continue or are bothersome):  constipation  diarrhea  gas This list may not describe all possible side effects. Call your doctor for medical advice about side effects. You may report side effects to FDA at 1-800-FDA-1088. Where should I keep my medicine? Keep out of the reach of children. Store at room temperature  between 15 and 30 degrees C (59 and 86 degrees F). Throw away any unused medicine after the expiration date. NOTE: This sheet is a summary. It may not cover all possible information. If you have questions about this medicine, talk to your doctor, pharmacist, or health care provider.  2020 Elsevier/Gold Standard (2018-12-06 11:21:28)

## 2019-07-24 LAB — CBC WITH DIFFERENTIAL/PLATELET
Absolute Monocytes: 857 cells/uL (ref 200–950)
Basophils Absolute: 28 cells/uL (ref 0–200)
Basophils Relative: 0.5 %
Eosinophils Absolute: 162 cells/uL (ref 15–500)
Eosinophils Relative: 2.9 %
HCT: 44.2 % (ref 38.5–50.0)
Hemoglobin: 15.3 g/dL (ref 13.2–17.1)
Lymphs Abs: 1669 cells/uL (ref 850–3900)
MCH: 30.3 pg (ref 27.0–33.0)
MCHC: 34.6 g/dL (ref 32.0–36.0)
MCV: 87.5 fL (ref 80.0–100.0)
MPV: 9.3 fL (ref 7.5–12.5)
Monocytes Relative: 15.3 %
Neutro Abs: 2884 cells/uL (ref 1500–7800)
Neutrophils Relative %: 51.5 %
Platelets: 290 10*3/uL (ref 140–400)
RBC: 5.05 10*6/uL (ref 4.20–5.80)
RDW: 14.5 % (ref 11.0–15.0)
Total Lymphocyte: 29.8 %
WBC: 5.6 10*3/uL (ref 3.8–10.8)

## 2019-07-24 LAB — COMPLETE METABOLIC PANEL WITH GFR
AG Ratio: 1.7 (calc) (ref 1.0–2.5)
ALT: 27 U/L (ref 9–46)
AST: 43 U/L — ABNORMAL HIGH (ref 10–35)
Albumin: 4.9 g/dL (ref 3.6–5.1)
Alkaline phosphatase (APISO): 63 U/L (ref 35–144)
BUN: 15 mg/dL (ref 7–25)
CO2: 26 mmol/L (ref 20–32)
Calcium: 9.8 mg/dL (ref 8.6–10.3)
Chloride: 101 mmol/L (ref 98–110)
Creat: 1.16 mg/dL (ref 0.70–1.33)
GFR, Est African American: 82 mL/min/{1.73_m2} (ref >=60)
GFR, Est Non African American: 70 mL/min/{1.73_m2} (ref >=60)
Globulin: 2.9 g/dL (calc) (ref 1.9–3.7)
Glucose, Bld: 102 mg/dL — ABNORMAL HIGH (ref 65–99)
Potassium: 4.3 mmol/L (ref 3.5–5.3)
Sodium: 139 mmol/L (ref 135–146)
Total Bilirubin: 0.5 mg/dL (ref 0.2–1.2)
Total Protein: 7.8 g/dL (ref 6.1–8.1)

## 2019-07-24 LAB — LIPID PANEL
Cholesterol: 166 mg/dL (ref <200)
HDL: 48 mg/dL (ref >=40)
LDL Cholesterol (Calc): 101 mg/dL (calc) — ABNORMAL HIGH
Non-HDL Cholesterol (Calc): 118 mg/dL (calc) (ref <130)
Total CHOL/HDL Ratio: 3.5 (calc) (ref <5.0)
Triglycerides: 77 mg/dL (ref <150)

## 2019-07-24 LAB — TSH: TSH: 3.13 mIU/L (ref 0.40–4.50)

## 2019-07-24 LAB — PSA: PSA: 0.7 ng/mL (ref <=4.0)

## 2019-07-24 LAB — MAGNESIUM: Magnesium: 2.1 mg/dL (ref 1.5–2.5)

## 2019-08-19 NOTE — Progress Notes (Signed)
Virtual Visit via Video Note The purpose of this virtual visit is to provide medical care while limiting exposure to the novel coronavirus.    Consent was obtained for video visit:  Yes Answered questions that patient had about telehealth interaction:  Yes I discussed the limitations, risks, security and privacy concerns of performing an evaluation and management service by telemedicine. I also discussed with the patient that there may be a patient responsible charge related to this service. The patient expressed understanding and agreed to proceed.  Pt location: Home Physician Location: office Name of referring provider:  [name], MD I connected with Carolin Guernsey at patients initiation/request on 08/20/2019 at  8:50 AM EST by video enabled telemedicine application and verified that I am speaking with the correct person using two identifiers. Pt MRN:  [id number] Pt DOB:  [birth year] Video Participants:  Carolin Guernsey   History of Present Illness:  Donald Schwartz is a 53 year old right-handed man with hypertension, CKD stage II, hyperlipidemia and OSA who follows up for right-sided trigeminal neuralgia.  UPDATE: Current medication: Oxcarbazepine 600 mg twice daily  At the first of the month, he had a twinge here and there for about a week and then resolved.  He has been doing well.  07/23/2019 LABS:  CBC with WBC 5.6, HGB 15.3, HCT 44.2, PLT 290; CMP with Na 139, K 4.3, Cl 101, CO2 26, glucose 102, BUN 15, Cr 1.16, t bili 0.5, ALP 63, AST 43, ALT 27.  HISTORY:  Beginning in January 2016, he started having pain involving the right side of the face.  It started in the right eye, then involving the right top of the head, right side of nose and right upper teeth.  It is a paroxysmal shooting pain lasting a few seconds at a time and occurring several times daily.  It is triggered by lightly touching the cheek, eating or feeling of water running over his head.  Sometimes there is  slight tingling but usually not.  Nothing relieves it.  It aborts spontaneously.  There is no numbness or facial weakness.  He was evaluated by the eye doctor and dentist with normal exams.  Prior medications: Lyrica.  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  His insurance would not cover baclofen.   Past Medical History: Past Medical History:  Diagnosis Date  . Allergy   . Arthritis   . Elevated hemoglobin A1c   . Elevated LDL cholesterol level   . Hx of colonic polyp 11/20/2014  . Hypertension   . Hypogonadism male   . Neuromuscular disorder (HCC)    Trigeminar neuralgia  . Sleep apnea    wears CPAP  . Vitamin D deficiency     Medications: Outpatient Encounter Medications as of 08/20/2019  Medication Sig  . aspirin 81 MG chewable tablet Chew 1 tablet (81 mg total) by mouth daily.  . Cholecalciferol (VITAMIN D PO) Take 10,000 Units by mouth daily.   Marland Kitchen diltiazem (CARDIZEM) 120 MG tablet Take 1 tablet by mouth once daily for blood pressure  . fluticasone (FLONASE) 50 MCG/ACT nasal spray USE ONE SPRAY EACH NOSTRIL TWICE DAILY  . Lecithin 1200 MG CAPS Take by mouth daily.  Marland Kitchen lisinopril (ZESTRIL) 40 MG tablet Take 1 tablet Daily for BP  . Magnesium 250 MG TABS Take 500 mg by mouth 2 (two) times a day.   . meloxicam (MOBIC) 15 MG tablet Take one daily with food for 2 weeks, then 1/2-1  tab daily as needed for pain. Can take with tylenol, can not take with aleve, iburpofen, then as needed daily for pain  . Multiple Vitamins-Minerals (MULTIVITAMIN MEN PO) Take by mouth daily.  . Omega-3 Fatty Acids (FISH OIL) 1000 MG CAPS Take 1,200 mg by mouth. Take two tablets in the morning and two tablets in the evening  . oxcarbazepine (TRILEPTAL) 600 MG tablet Take 1 tablet by mouth twice daily  . phentermine (ADIPEX-P) 37.5 MG tablet Take 1 tablet every Morning for Dieting & Weight Loss  . Red Yeast Rice Extract (RED YEAST RICE PO) Take 600 mg by mouth 2  (two) times daily.   . sildenafil (VIAGRA) 100 MG tablet 1/2-1 pill as needed 1 hour before intercourse, can get with good RX from [employer]  . SYRINGE-NEEDLE, DISP, 3 ML (B-D 3CC LUER-LOK SYR 21GX1") 21G X 1" 3 ML MISC USE AS DIRECTED  . triamcinolone ointment (KENALOG) 0.5 % Apply 1 application topically 2 (two) times daily. Apply to rash 2 to 3 x/day (Patient not taking: Reported on 07/23/2019)  . valACYclovir (VALTREX) 1000 MG tablet Take 1 tablet daily prophylaxis for fever blisters (Patient taking differently: as needed. Take 1 tablet daily prophylaxis for fever blisters)   No facility-administered encounter medications on file as of 08/20/2019.     Allergies: Allergies  Allergen Reactions  . Toprol Xl [Metoprolol Tartrate]     ED    Family History: Family History  Problem Relation Age of Onset  . Colon cancer Maternal Uncle   . Prostate cancer Maternal Uncle   . Colon cancer Paternal Uncle   . Colon cancer Maternal Grandfather   . Colon cancer Paternal Grandfather   . Prostate cancer Father   . Stomach cancer Neg Hx   . Esophageal cancer Neg Hx     Social History: Social History   Socioeconomic History  . Marital status: Married    Spouse name: Not on file  . Number of children: Not on file  . Years of education: Not on file  . Highest education level: Not on file  Occupational History    Employer: [employer]  Social Needs  . Financial resource strain: Not on file  . Food insecurity    Worry: Not on file    Inability: Not on file  . Transportation needs    Medical: Not on file    Non-medical: Not on file  Tobacco Use  . Smoking status: Former Smoker    Quit date: 09/20/1989    Years since quitting: 29.9  . Smokeless tobacco: Never Used  Substance and Sexual Activity  . Alcohol use: No    Alcohol/week: 0.0 standard drinks  . Drug use: No  . Sexual activity: Yes    Partners: Female  Lifestyle  . Physical activity    Days per week: Not on  file    Minutes per session: Not on file  . Stress: Not on file  Relationships  . Social Herbalist on phone: Not on file    Gets together: Not on file    Attends religious service: Not on file    Active member of club or organization: Not on file    Attends meetings of clubs or organizations: Not on file    Relationship status: Not on file  . Intimate partner violence    Fear of current or ex partner: Not on file    Emotionally abused: Not on file    Physically abused:  Not on file    Forced sexual activity: Not on file  Other Topics Concern  . Not on file  Social History Narrative  . Not on file    Observations/Objective:   Height 6' (1.829 m), weight 252 lb (114.3 kg). No acute distress.  Alert and oriented.  Speech fluent and not dysarthric.  Language intact.  Eyes orthophoric on primary gaze.  Face symmetric.  Assessment and Plan:   Right-sided trigeminal neuralgia  1.  Oxcarbazepine 600mg  twice daily 2.  Follow up in 6 months.  Follow Up Instructions:    -I discussed the assessment and treatment plan with the patient. The patient was provided an opportunity to ask questions and all were answered. The patient agreed with the plan and demonstrated an understanding of the instructions.   The patient was advised to call back or seek an in-person evaluation if the symptoms worsen or if the condition fails to improve as anticipated.    Total Time spent in visit with the patient was:  15 minutes    Dudley Major, DO

## 2019-08-20 ENCOUNTER — Telehealth (INDEPENDENT_AMBULATORY_CARE_PROVIDER_SITE_OTHER): Payer: BC Managed Care – PPO | Admitting: Neurology

## 2019-08-20 ENCOUNTER — Other Ambulatory Visit: Payer: Self-pay

## 2019-08-20 ENCOUNTER — Encounter: Payer: Self-pay | Admitting: Neurology

## 2019-08-20 VITALS — Ht 72.0 in | Wt 252.0 lb

## 2019-08-20 DIAGNOSIS — G500 Trigeminal neuralgia: Secondary | ICD-10-CM

## 2019-09-06 ENCOUNTER — Other Ambulatory Visit: Payer: Self-pay | Admitting: Neurology

## 2019-09-07 NOTE — Telephone Encounter (Signed)
Requested Prescriptions   Pending Prescriptions Disp Refills  . oxcarbazepine (TRILEPTAL) 600 MG tablet [Pharmacy Med Name: OXcarbazepine 600 MG Oral Tablet] 60 tablet 0    Sig: Take 1 tablet by mouth twice daily   Rx last filled: 05/04/19 #60 3 refill  Pt last seen: 08/20/19  Follow up appt scheduled:02/20/2020

## 2019-09-19 ENCOUNTER — Other Ambulatory Visit: Payer: Self-pay | Admitting: Internal Medicine

## 2019-10-08 ENCOUNTER — Other Ambulatory Visit: Payer: Self-pay | Admitting: Adult Health

## 2019-10-23 ENCOUNTER — Encounter: Payer: Self-pay | Admitting: Internal Medicine

## 2019-10-23 ENCOUNTER — Other Ambulatory Visit: Payer: Self-pay

## 2019-10-23 ENCOUNTER — Ambulatory Visit (INDEPENDENT_AMBULATORY_CARE_PROVIDER_SITE_OTHER): Payer: BC Managed Care – PPO | Admitting: Internal Medicine

## 2019-10-23 VITALS — BP 128/80 | HR 76 | Temp 96.5°F | Resp 18 | Ht 72.5 in | Wt 271.8 lb

## 2019-10-23 DIAGNOSIS — E782 Mixed hyperlipidemia: Secondary | ICD-10-CM

## 2019-10-23 DIAGNOSIS — R7309 Other abnormal glucose: Secondary | ICD-10-CM

## 2019-10-23 DIAGNOSIS — Z79899 Other long term (current) drug therapy: Secondary | ICD-10-CM | POA: Diagnosis not present

## 2019-10-23 DIAGNOSIS — E559 Vitamin D deficiency, unspecified: Secondary | ICD-10-CM | POA: Diagnosis not present

## 2019-10-23 DIAGNOSIS — I10 Essential (primary) hypertension: Secondary | ICD-10-CM

## 2019-10-23 DIAGNOSIS — L820 Inflamed seborrheic keratosis: Secondary | ICD-10-CM

## 2019-10-23 NOTE — Progress Notes (Signed)
History of Present Illness:      This very nice 54 y.o. MWM presents for 6 month follow up with HTN, HLD, Pre-Diabetes and Vitamin D Deficiency.  Patient also is on CPAP for OSA with improved sleep hygiene.       Patient is treated for HTN (2002) & BP has been controlled at home. Today's BP is at goal - 128/80. Patient has had no complaints of any cardiac type chest pain, palpitations, dyspnea / orthopnea / PND, dizziness, claudication, or dependent edema.      Hyperlipidemia is controlled with diet & supplements ([place]) . Last Lipids were at goal:  Lab Results  Component Value Date   CHOL 166 07/23/2019   HDL 48 07/23/2019   LDLCALC 101 (H) 07/23/2019   TRIG 77 07/23/2019   CHOLHDL 3.5 07/23/2019        Also, the patient has Morbid Obesity (BMI 35+) and consequent PreDiabetes and has had no symptoms of reactive hypoglycemia, diabetic polys, paresthesias or visual blurring.  Last A1c was not at goal:  Lab Results  Component Value Date   HGBA1C 6.1 (H) 04/18/2019        Further, the patient also has history of Vitamin D Deficiency ("31" / 2008)  and supplements vitamin D without any suspected side-effects. Last vitamin D was at goal: Lab Results  Component Value Date   VD25OH 84 04/18/2019    Current Outpatient Medications on File Prior to Visit  Medication Sig  . Ascorbic Acid (VITAMIN C) 500 MG CAPS Take 1 capsule by mouth daily.   Marland Kitchen aspirin 81 MG chewable tablet Chew 1 tablet (81 mg total) by mouth daily.  . Cholecalciferol (VITAMIN D PO) Take 10,000 Units by mouth daily.   Marland Kitchen diltiazem (CARDIZEM) 120 MG tablet Take 1 tablet by mouth once daily for blood pressure  . fluticasone (FLONASE) 50 MCG/ACT nasal spray USE ONE SPRAY EACH NOSTRIL TWICE DAILY  . Lecithin 1200 MG CAPS Take by mouth daily.  Marland Kitchen lisinopril (ZESTRIL) 40 MG tablet Take 1 tablet Daily for BP  . Magnesium 250 MG TABS Take 500 mg by mouth 2 (two) times a day.   . Multiple Vitamins-Minerals  (MULTIVITAMIN MEN PO) Take by mouth daily.  . Omega-3 Fatty Acids (FISH OIL) 1000 MG CAPS Take 1,200 mg by mouth. Take two tablets in the morning and two tablets in the evening  . oxcarbazepine (TRILEPTAL) 600 MG tablet Take 1 tablet by mouth twice daily  . phentermine (ADIPEX-P) 37.5 MG tablet Take 1 tablet every Morning for Dieting & Weight Loss  . Red Yeast Rice Extract (RED YEAST RICE PO) Take 600 mg by mouth 2 (two) times daily.   . sildenafil (VIAGRA) 100 MG tablet 1/2-1 pill as needed 1 hour before intercourse, can get with good RX from [employer]  . valACYclovir (VALTREX) 1000 MG tablet Take 1 tablet Daily to Prevent Fever Blisters  . zinc gluconate 50 MG tablet Take 50 mg by mouth daily.   No current facility-administered medications on file prior to visit.    Allergies  Allergen Reactions  . Toprol Xl [Metoprolol Tartrate]     ED    PMHx:   Past Medical History:  Diagnosis Date  . Allergy   . Arthritis   . Elevated hemoglobin A1c   . Elevated LDL cholesterol level   . Hx of colonic polyp 11/20/2014  . Hypertension   . Hypogonadism male   . Neuromuscular disorder (  HCC)    Trigeminar neuralgia  . Sleep apnea    wears CPAP  . Vitamin D deficiency    Immunization History  Administered Date(s) Administered  . Influenza Inj Mdck Quad With Preservative 06/02/2017, 06/28/2018, 07/23/2019  . Influenza Split 07/10/2014  . Influenza-Unspecified 07/19/2016  . PPD Test 01/07/2014, 03/20/2018, 04/18/2019  . Pneumococcal-Unspecified 09/20/2009  . Td 09/20/2005  . Tdap 02/03/2016   Past Surgical History:  Procedure Laterality Date  . c5-6 fusion     2012  . COLONOSCOPY    . HIP FRACTURE SURGERY Right 1986   avulsion/chip fracture ?stress fracture    FHx:    Reviewed / unchanged  SHx:    Reviewed / unchanged   Systems Review:  Constitutional: Denies fever, chills, wt changes, headaches, insomnia, fatigue, night sweats, change in appetite. Eyes: Denies redness,  blurred vision, diplopia, discharge, itchy, watery eyes.  ENT: Denies discharge, congestion, post nasal drip, epistaxis, sore throat, earache, hearing loss, dental pain, tinnitus, vertigo, sinus pain, snoring.  CV: Denies chest pain, palpitations, irregular heartbeat, syncope, dyspnea, diaphoresis, orthopnea, PND, claudication or edema. Respiratory: denies cough, dyspnea, DOE, pleurisy, hoarseness, laryngitis, wheezing.  Gastrointestinal: Denies dysphagia, odynophagia, heartburn, reflux, water brash, abdominal pain or cramps, nausea, vomiting, bloating, diarrhea, constipation, hematemesis, melena, hematochezia  or hemorrhoids. Genitourinary: Denies dysuria, frequency, urgency, nocturia, hesitancy, discharge, hematuria or flank pain. Musculoskeletal: Denies arthralgias, myalgias, stiffness, jt. swelling, pain, limping or strain/sprain.  Skin: Denies pruritus, rash, hives, warts, acne, eczema or change in skin lesion(s). Neuro: No weakness, tremor, incoordination, spasms, paresthesia or pain. Psychiatric: Denies confusion, memory loss or sensory loss. Endo: Denies change in weight, skin or hair change.  Heme/Lymph: No excessive bleeding, bruising or enlarged lymph nodes.  Physical Exam  BP 128/80   Pulse 76   Temp (!) 96.5 F (35.8 C)   Resp 18   Ht 6' 0.5" (1.842 m)   Wt 271 lb 12.8 oz (123.3 kg)   BMI 36.36 kg/m   Appears  well nourished, well groomed  and in no distress.  Eyes: PERRLA, EOMs, conjunctiva no swelling or erythema. Sinuses: No frontal/maxillary tenderness ENT/Mouth: EAC's clear, TM's nl w/o erythema, bulging. Nares clear w/o erythema, swelling, exudates. Oropharynx clear without erythema or exudates. Oral hygiene is good. Tongue normal, non obstructing. Hearing intact.  Neck: Supple. Thyroid not palpable. Car 2+/2+ without bruits, nodes or JVD. Chest: Respirations nl with BS clear & equal w/o rales, rhonchi, wheezing or stridor.  Cor: Heart sounds normal w/ regular rate  and rhythm without sig. murmurs, gallops, clicks or rubs. Peripheral pulses normal and equal  without edema.  Abdomen: Soft & bowel sounds normal. Non-tender w/o guarding, rebound, hernias, masses or organomegaly.  Lymphatics: Unremarkable.  Musculoskeletal: Full ROM all peripheral extremities, joint stability, 5/5 strength and normal gait.  Skin: Warm, dry without exposed rashes, lesions or ecchymosis apparent.  Neuro: Cranial nerves intact, reflexes equal bilaterally. Sensory-motor testing grossly intact. Tendon reflexes grossly intact.  Pysch: Alert & oriented x 3.  Insight and judgement nl & appropriate. No ideations.  Assessment and Plan:  1. Essential hypertension  - Continue medication, monitor blood pressure at home.  - Continue DASH diet.  Reminder to go to the ER if any CP,  SOB, nausea, dizziness, severe HA, changes vision/speech.  - CBC with Differential/Platelet - COMPLETE METABOLIC PANEL WITH GFR - Magnesium - TSH  2. Mixed hyperlipidemia  - Continue diet/meds, exercise,& lifestyle modifications.  - Continue monitor periodic cholesterol/liver & renal functions   -  Lipid panel - TSH  3. Abnormal glucose  - Continue diet, exercise  - Lifestyle modifications.  - Monitor appropriate labs.  - Hemoglobin A1c - Insulin, random  4. Vitamin D deficiency  - Continue supplementation.  - VITAMIN D 25 Hydroxy   5. PreDiabetes  - Hemoglobin A1c - Insulin, random  6. Medication management  - CBC with Differential/Platelet - COMPLETE METABOLIC PANEL WITH GFR - Magnesium - Lipid panel - TSH - Hemoglobin A1c - Insulin, random - VITAMIN D 25 Hydroxy        Discussed  regular exercise, BP monitoring, weight control to achieve/maintain BMI less than 25 and discussed med and SE's. Recommended labs to assess and monitor clinical status with further disposition pending results of labs.  I discussed the assessment and treatment plan with the patient. The patient was  provided an opportunity to ask questions and all were answered. The patient agreed with the plan and demonstrated an understanding of the instructions.  I provided over 30 minutes of exam, counseling, chart review and  complex critical decision making.  Kirtland Bouchard, MD

## 2019-10-23 NOTE — Patient Instructions (Signed)

## 2019-10-24 LAB — COMPLETE METABOLIC PANEL WITH GFR
AG Ratio: 1.8 (calc) (ref 1.0–2.5)
ALT: 29 U/L (ref 9–46)
AST: 33 U/L (ref 10–35)
Albumin: 4.8 g/dL (ref 3.6–5.1)
Alkaline phosphatase (APISO): 55 U/L (ref 35–144)
BUN: 15 mg/dL (ref 7–25)
CO2: 28 mmol/L (ref 20–32)
Calcium: 9.6 mg/dL (ref 8.6–10.3)
Chloride: 93 mmol/L — ABNORMAL LOW (ref 98–110)
Creat: 1.12 mg/dL (ref 0.70–1.33)
GFR, Est African American: 85 mL/min/{1.73_m2} (ref >=60)
GFR, Est Non African American: 74 mL/min/{1.73_m2} (ref >=60)
Globulin: 2.7 g/dL (calc) (ref 1.9–3.7)
Glucose, Bld: 94 mg/dL (ref 65–99)
Potassium: 4.4 mmol/L (ref 3.5–5.3)
Sodium: 130 mmol/L — ABNORMAL LOW (ref 135–146)
Total Bilirubin: 0.4 mg/dL (ref 0.2–1.2)
Total Protein: 7.5 g/dL (ref 6.1–8.1)

## 2019-10-24 LAB — LIPID PANEL
Cholesterol: 175 mg/dL (ref <200)
HDL: 43 mg/dL (ref >=40)
LDL Cholesterol (Calc): 106 mg/dL (calc) — ABNORMAL HIGH
Non-HDL Cholesterol (Calc): 132 mg/dL (calc) — ABNORMAL HIGH (ref <130)
Total CHOL/HDL Ratio: 4.1 (calc) (ref <5.0)
Triglycerides: 151 mg/dL — ABNORMAL HIGH (ref <150)

## 2019-10-24 LAB — CBC WITH DIFFERENTIAL/PLATELET
Absolute Monocytes: 725 cells/uL (ref 200–950)
Basophils Absolute: 41 cells/uL (ref 0–200)
Basophils Relative: 0.7 %
Eosinophils Absolute: 151 cells/uL (ref 15–500)
Eosinophils Relative: 2.6 %
HCT: 44 % (ref 38.5–50.0)
Hemoglobin: 15.6 g/dL (ref 13.2–17.1)
Lymphs Abs: 1566 cells/uL (ref 850–3900)
MCH: 32.4 pg (ref 27.0–33.0)
MCHC: 35.5 g/dL (ref 32.0–36.0)
MCV: 91.3 fL (ref 80.0–100.0)
MPV: 8.9 fL (ref 7.5–12.5)
Monocytes Relative: 12.5 %
Neutro Abs: 3318 cells/uL (ref 1500–7800)
Neutrophils Relative %: 57.2 %
Platelets: 276 10*3/uL (ref 140–400)
RBC: 4.82 10*6/uL (ref 4.20–5.80)
RDW: 12.4 % (ref 11.0–15.0)
Total Lymphocyte: 27 %
WBC: 5.8 10*3/uL (ref 3.8–10.8)

## 2019-10-24 LAB — HEMOGLOBIN A1C
Hgb A1c MFr Bld: 5.9 % of total Hgb — ABNORMAL HIGH (ref <5.7)
Mean Plasma Glucose: 123 (calc)
eAG (mmol/L): 6.8 (calc)

## 2019-10-24 LAB — MAGNESIUM: Magnesium: 2 mg/dL (ref 1.5–2.5)

## 2019-10-24 LAB — VITAMIN D 25 HYDROXY (VIT D DEFICIENCY, FRACTURES): Vit D, 25-Hydroxy: 64 ng/mL (ref 30–100)

## 2019-10-24 LAB — INSULIN, RANDOM: Insulin: 16.9 u[IU]/mL

## 2019-10-24 LAB — TSH: TSH: 2.48 mIU/L (ref 0.40–4.50)

## 2019-10-25 ENCOUNTER — Other Ambulatory Visit: Payer: Self-pay

## 2019-10-25 MED ORDER — MELOXICAM 15 MG PO TABS: ORAL_TABLET | ORAL | 3 refills | Status: DC

## 2019-12-12 ENCOUNTER — Other Ambulatory Visit: Payer: Self-pay | Admitting: Internal Medicine

## 2020-01-03 ENCOUNTER — Ambulatory Visit: Payer: BC Managed Care – PPO | Attending: Internal Medicine

## 2020-01-03 DIAGNOSIS — Z23 Encounter for immunization: Secondary | ICD-10-CM

## 2020-01-03 NOTE — Progress Notes (Signed)
   Covid-19 Vaccination Clinic  Name:  Donald Schwartz    MRN: [id number] DOB: 11-12-1965  01/03/2020  [name] was observed post Covid-19 immunization for 15 minutes without incident. He was provided with Vaccine Information Sheet and instruction to access the V-Safe system.   [name] was instructed to call 911 with any severe reactions post vaccine: Marland Kitchen Difficulty breathing  . Swelling of face and throat  . A fast heartbeat  . A bad rash all over body  . Dizziness and weakness   Immunizations Administered    Name Date Dose VIS Date Route   Pfizer COVID-19 Vaccine 01/03/2020  4:54 PM 0.3 mL 08/31/2019 Intramuscular   Manufacturer: [place]   Lot: [id number]   [place]: [id number]

## 2020-01-28 ENCOUNTER — Ambulatory Visit: Payer: BC Managed Care – PPO | Attending: Internal Medicine

## 2020-01-28 DIAGNOSIS — Z23 Encounter for immunization: Secondary | ICD-10-CM

## 2020-01-28 NOTE — Progress Notes (Signed)
   Covid-19 Vaccination Clinic  Name:  ZEN MALOTTE    MRN: [id number] DOB: 02/14/1966  01/28/2020  Mr. Scholes was observed post Covid-19 immunization for 15 minutes without incident. He was provided with Vaccine Information Sheet and instruction to access the V-Safe system.   Mr. Holohan was instructed to call 911 with any severe reactions post vaccine: Marland Kitchen Difficulty breathing  . Swelling of face and throat  . A fast heartbeat  . A bad rash all over body  . Dizziness and weakness   Immunizations Administered    Name Date Dose VIS Date Route   Pfizer COVID-19 Vaccine 01/28/2020  4:54 PM 0.3 mL 11/14/2018 Intramuscular   Manufacturer: [place]   Lot: [id number]   [place]: [id number]

## 2020-01-29 ENCOUNTER — Other Ambulatory Visit: Payer: Self-pay

## 2020-01-29 ENCOUNTER — Encounter: Payer: Self-pay | Admitting: Adult Health Nurse Practitioner

## 2020-01-29 ENCOUNTER — Ambulatory Visit (INDEPENDENT_AMBULATORY_CARE_PROVIDER_SITE_OTHER): Payer: BC Managed Care – PPO | Admitting: Adult Health Nurse Practitioner

## 2020-01-29 VITALS — BP 110/74 | HR 66 | Temp 97.2°F | Resp 16 | Ht 72.5 in | Wt 278.0 lb

## 2020-01-29 DIAGNOSIS — Z860100 Personal history of colon polyps, unspecified: Secondary | ICD-10-CM

## 2020-01-29 DIAGNOSIS — E559 Vitamin D deficiency, unspecified: Secondary | ICD-10-CM

## 2020-01-29 DIAGNOSIS — R972 Elevated prostate specific antigen [PSA]: Secondary | ICD-10-CM

## 2020-01-29 DIAGNOSIS — E669 Obesity, unspecified: Secondary | ICD-10-CM

## 2020-01-29 DIAGNOSIS — N182 Chronic kidney disease, stage 2 (mild): Secondary | ICD-10-CM

## 2020-01-29 DIAGNOSIS — I10 Essential (primary) hypertension: Secondary | ICD-10-CM | POA: Diagnosis not present

## 2020-01-29 DIAGNOSIS — Z79899 Other long term (current) drug therapy: Secondary | ICD-10-CM

## 2020-01-29 DIAGNOSIS — G43009 Migraine without aura, not intractable, without status migrainosus: Secondary | ICD-10-CM

## 2020-01-29 DIAGNOSIS — R7309 Other abnormal glucose: Secondary | ICD-10-CM | POA: Diagnosis not present

## 2020-01-29 DIAGNOSIS — E782 Mixed hyperlipidemia: Secondary | ICD-10-CM | POA: Diagnosis not present

## 2020-01-29 DIAGNOSIS — Z6837 Body mass index (BMI) 37.0-37.9, adult: Secondary | ICD-10-CM

## 2020-01-29 DIAGNOSIS — E66811 Obesity, class 1: Secondary | ICD-10-CM

## 2020-01-29 DIAGNOSIS — G4733 Obstructive sleep apnea (adult) (pediatric): Secondary | ICD-10-CM

## 2020-01-29 DIAGNOSIS — Z9989 Dependence on other enabling machines and devices: Secondary | ICD-10-CM

## 2020-01-29 DIAGNOSIS — G500 Trigeminal neuralgia: Secondary | ICD-10-CM

## 2020-01-29 NOTE — Progress Notes (Signed)
3 Month Follow Up   Assessment and Plan:   Donald Schwartz was seen today for follow-up.  Diagnoses and all orders for this visit:  Essential hypertension Continue current medications:diltiazem 120mg  daily, lisinopril 40mg  Monitor blood pressure at home; call if consistently over 130/80 Continue DASH diet.   Reminder to go to the ER if any CP, SOB, nausea, dizziness, severe HA, changes vision/speech, left arm numbness and tingling and jaw pain. -     CBC with Differential/Platelet -     COMPLETE METABOLIC PANEL WITH GFR  Mixed hyperlipidemia Continue medications: Fish Oil 1,000mg  two tablets BID Discussed dietary and exercise modifications Low fat diet -     Lipid panel  Abnormal glucose Discussed dietary and exercise modifications -     Hemoglobin A1c  Vitamin D deficiency Continue supplementation Taking Vitamin D 10,000 IU daily  CKD (chronic kidney disease) stage 2, GFR 60-89 ml/min Increase fluids  Avoid NSAIDS Blood pressure control Monitor sugars  Will continue to monitor  Obesity (BMI 37.0-37.9) Discussed dietary and exercise modifications  Elevated PSA -     PSA  OSA on CPAP Continue CPAP/BiPAP, using nightly for at least 8 hours  Helping with daytime fatigue Weight loss still advised Discussed mask & tubing hygeine  Hx of colonic polyp + FHx CRCA Q3 years Due in 2022  Trigeminal neuralgia of right side of face Follows with neurology Q53months  Migraine without aura and without status migrainosus, not intractable Using tylenol with intermittent results Has tried abortive medication in the past Ubrelvy samples provided  Medication management Continued   Continue diet and meds as discussed. Further disposition pending results of labs. Discussed med's effects and SE's.  Patient agrees with plan of care and opportunity to ask questions/voice concerns.  Over 30 minutes of chart review, face to face interview, exam, counseling, and critical decision  making was performed.   Future Appointments  Date Time Provider Donald Schwartz  02/20/2020  8:50 AM Donald Partridge, DO [facility] None  05/23/2020 10:00 AM Donald Pinto, MD [facility] None    ----------------------------------------------------------------------------------------------------------------------  HPI 54 y.o. male  presents for 3 month follow up on HTN, HLD, abnormal glucose with history of pre-diabetes, weight and vitamin D deficiency. He is on CPAP for OSA.  He has history of trigeminal neuralgia, 2 years was the last flare.  He follows with Neurology for this and his next OV is 03/2020  Reports he takes tylenol when he has a migraine he will take tylenol.  He reports some relife but has had unresolved headaches for years.  He does report some photophobia with this. He has seen a headache specialists in the past.  He had taken midron but is no longer taking this for them as well as abortive medication.  Reports the headache varies in intensity but he is able to mange though the day.    Report he has been having left knee pain and has been seen orthopedics.  Report he is not bad enough for replacement but not a candidate for orthoscopic surgery.  He is managing with Diclofenac 1% 3-4 times a day.  He is walking with his wife for exercise but reports the past couple months there has been a decrease in frequency.   BMI is Body mass index is 37.19 kg/m., he has not been working on diet and exercise. Wt Readings from Last 3 Encounters:  01/29/20 278 lb (126.1 kg)  10/23/19 271 lb 12.8 oz (123.3 kg)  08/20/19 252 lb (114.3 kg)  HTN predates 2002 His blood pressure has been controlled at home, today their BP is BP: 110/74  He does workout. He denies any cardiac symptoms, chest pains, palpitations, shortness of breath, dizziness or lower extremity edema.      He is on cholesterol medication omega 3 and Red Yeast Rice Supplement and denies myalgias.   His cholesterol is  not at goal. The cholesterol last visit was:   Lab Results  Component Value Date   CHOL 175 10/23/2019   HDL 43 10/23/2019   LDLCALC 106 (H) 10/23/2019   TRIG 151 (H) 10/23/2019   CHOLHDL 4.1 10/23/2019    He has been working on diet and exercise for prediabetes, and denies paresthesia of the feet, polydipsia, polyuria, visual disturbances, vomiting and weight loss. Last A1C in the office was:  Lab Results  Component Value Date   HGBA1C 5.9 (H) 10/23/2019     Patient does have history of CKD.  Their last GFR was:  Lab Results  Component Value Date   GFRNONAA 74 10/23/2019   Lab Results  Component Value Date   GFRAA 85 10/23/2019     Patient is on Vitamin D supplement for deficiency (31/2008) Lab Results  Component Value Date   VD25OH 64 10/23/2019        Current Medications:  Current Outpatient Medications on File Prior to Visit  Medication Sig  . Ascorbic Acid (VITAMIN C) 500 MG CAPS Take 1 capsule by mouth daily.   Marland Kitchen aspirin 81 MG chewable tablet Chew 1 tablet (81 mg total) by mouth daily.  . Cholecalciferol (VITAMIN D PO) Take 10,000 Units by mouth daily.   Marland Kitchen diltiazem (CARDIZEM) 120 MG tablet Take 1 tablet by mouth once daily for blood pressure  . fluticasone (FLONASE) 50 MCG/ACT nasal spray USE ONE SPRAY EACH NOSTRIL TWICE DAILY  . Lecithin 1200 MG CAPS Take by mouth daily.  Marland Kitchen lisinopril (ZESTRIL) 40 MG tablet Take 1 tablet Daily for BP  . Magnesium 250 MG TABS Take 500 mg by mouth 2 (two) times a day.   . meloxicam (MOBIC) 15 MG tablet Take 1 tablet daily with food for pain and inflammation. Can take with tylenol, can not take with aleve, iburpofen, then as needed daily for pain  . Multiple Vitamins-Minerals (MULTIVITAMIN MEN PO) Take by mouth daily.  . Omega-3 Fatty Acids (FISH OIL) 1000 MG CAPS Take 1,200 mg by mouth. Take two tablets in the morning and two tablets in the evening  . oxcarbazepine (TRILEPTAL) 600 MG tablet Take 1 tablet by mouth twice daily   . phentermine (ADIPEX-P) 37.5 MG tablet Take 1 tablet every Morning for Dieting & Weight Loss  . Red Yeast Rice Extract (RED YEAST RICE PO) Take 600 mg by mouth 2 (two) times daily.   . sildenafil (VIAGRA) 100 MG tablet 1/2-1 pill as needed 1 hour before intercourse, can get with good RX from [employer]  . valACYclovir (VALTREX) 1000 MG tablet Take 1 tablet Daily to Prevent Fever Blisters  . zinc gluconate 50 MG tablet Take 50 mg by mouth daily.   No current facility-administered medications on file prior to visit.    Allergies:  Allergies  Allergen Reactions  . Toprol Xl [Metoprolol Tartrate]     ED      Medical History:  Past Medical History:  Diagnosis Date  . Allergy   . Arthritis   . Elevated hemoglobin A1c   . Elevated LDL cholesterol level   . Hx of colonic polyp  11/20/2014  . Hypertension   . Hypogonadism male   . Neuromuscular disorder (HCC)    Trigeminar neuralgia  . Sleep apnea    wears CPAP  . Vitamin D deficiency      Family history- Reviewed and unchanged Family History  Problem Relation Age of Onset  . Colon cancer Maternal Uncle   . Prostate cancer Maternal Uncle   . Colon cancer Paternal Uncle   . Colon cancer Maternal Grandfather   . Colon cancer Paternal Grandfather   . Prostate cancer Father   . Stomach cancer Neg Hx   . Esophageal cancer Neg Hx      Social history- Reviewed and unchanged Social History   Tobacco Use  . Smoking status: Former Smoker    Quit date: 09/20/1989    Years since quitting: 30.3  . Smokeless tobacco: Never Used  Substance Use Topics  . Alcohol use: No    Alcohol/week: 0.0 standard drinks  . Drug use: No     Names of Other Physician/Practitioners you currently use: 1. [facility] here for primary care 2. Eye Exam: Due for 2021 3. Dental Exam: 09/2019, next 03/2020   Patient Care Team: Donald Pinto, MD as PCP - General (Internal Medicine) Donald Pinto, MD as  PCP - Internal Medicine (Internal Medicine) Gatha Mayer, MD as Consulting Physician (Gastroenterology) Donald Partridge, DO as Consulting Physician (Neurology) Melrose Nakayama, MD as Consulting Physician (Orthopedic Surgery)    Screening Tests: Immunization History  Administered Date(s) Administered  . Influenza Inj Mdck Quad With Preservative 06/02/2017, 06/28/2018, 07/23/2019  . Influenza Split 07/10/2014  . Influenza-Unspecified 07/19/2016  . PFIZER SARS-COV-2 Vaccination 01/03/2020, 01/28/2020  . PPD Test 01/07/2014, 03/20/2018, 04/18/2019  . Pneumococcal-Unspecified 09/20/2009  . Td 09/20/2005  . Tdap 02/03/2016     Vaccinations: TD or Tdap: 2017  Influenza: Due for 2021  Pneumococcal: 09/2009 Prevnar13: N/A Shingles: Zostavax/Shingrix: Discussed with patient COV2-SARS - Complete 01/28/2020  Preventative Care: Last colonoscopy: 01/2018 Hep C screening (1945-1965): 12/2013 Negative   Review of Systems:  Review of Systems  Constitutional: Negative for chills, diaphoresis, fever, malaise/fatigue and weight loss.  HENT: Negative for congestion, ear discharge, ear pain, hearing loss, nosebleeds, sinus pain, sore throat and tinnitus.   Eyes: Negative for blurred vision, double vision, photophobia, pain, discharge and redness.  Respiratory: Negative for cough, hemoptysis, sputum production, shortness of breath, wheezing and stridor.   Cardiovascular: Negative for chest pain, palpitations, orthopnea, claudication, leg swelling and PND.  Gastrointestinal: Negative for abdominal pain, blood in stool, constipation, diarrhea, heartburn, melena, nausea and vomiting.  Genitourinary: Negative for dysuria, flank pain, frequency, hematuria and urgency.  Musculoskeletal: Negative for back pain, falls, joint pain, myalgias and neck pain.  Skin: Negative for itching and rash.  Neurological: Positive for headaches. Negative for dizziness, tingling, tremors, sensory change, speech change,  focal weakness, seizures, loss of consciousness and weakness.  Endo/Heme/Allergies: Negative for environmental allergies and polydipsia. Does not bruise/bleed easily.  Psychiatric/Behavioral: Negative for depression, hallucinations, memory loss, substance abuse and suicidal ideas. The patient is not nervous/anxious and does not have insomnia.       Physical Exam: BP 110/74   Pulse 66   Temp (!) 97.2 F (36.2 C)   Resp 16   Ht 6' 0.5" (1.842 m)   Wt 278 lb (126.1 kg)   SpO2 96%   BMI 37.19 kg/m  Wt Readings from Last 3 Encounters:  01/29/20 278 lb (126.1 kg)  10/23/19 271 lb 12.8  oz (123.3 kg)  08/20/19 252 lb (114.3 kg)   General Appearance: Well nourished, in no apparent distress. Eyes: PERRLA, EOMs, conjunctiva no swelling or erythema Sinuses: No Frontal/maxillary tenderness ENT/Mouth: Ext aud canals clear, TMs without erythema, bulging. No erythema, swelling, or exudate on post pharynx.  Tonsils not swollen or erythematous. Hearing normal.  Neck: Supple, thyroid normal.  Respiratory: Respiratory effort normal, BS equal bilaterally without rales, rhonchi, wheezing or stridor.  Cardio: RRR with no MRGs. Brisk peripheral pulses without edema.  Abdomen: Soft, + BS.  Non tender, no guarding, rebound, hernias, masses. Lymphatics: Non tender without lymphadenopathy.  Musculoskeletal: Full ROM, 5/5 strength, Normal gait Skin: Warm, dry without rashes, lesions, ecchymosis.  Neuro: Cranial nerves intact. No cerebellar symptoms.  Psych: Awake and oriented X 3, normal affect, Insight and Judgment appropriate.    Garnet Sierras, NP [facility] 9:30 AM

## 2020-01-29 NOTE — Patient Instructions (Addendum)
Today you had a telephone visit with Garnet Sierras, DNP.  Below is a summary of your visit.   We will contact you in 1-2 days with your lab results via [place].  You will get the labs prior to [place] providing comments.  Roselyn Meier - Take one tablet at onset of migraine.  May repeat dose after 2 hours.  This is an abortive medication to take as needed.     Ask insurance and pharmacy about shingrix - it is a 2 part shot that we will not be getting in the office.   Suggest getting AFTER covid vaccines, have to wait at least a month This shot can make you feel bad due to such good immune response it can trigger some inflammation so take tylenol or aleve day of or day after and plan on resting.   Can go to [email or web address] for more information  Shingrix Vaccination  Two vaccines are licensed and recommended to prevent shingles in the [place] Zoster vaccine live (ZVL, Zostavax) has been in use since 2006. Recombinant zoster vaccine (RZV, Shingrix), has been in use since 2017 and is recommended by AC[employer] as the preferred shingles vaccine.  What Everyone Should Know about Shingles Vaccine (Shingrix) One of the Recommended Vaccines by Disease Shingles vaccination is the only way to protect against shingles and postherpetic neuralgia (PHN), the most common complication from shingles. CDC recommends that healthy adults 50 years and older get two doses of the shingles vaccine called Shingrix (recombinant zoster vaccine), separated by 2 to 6 months, to prevent shingles and the complications from the disease. Your doctor or pharmacist can give you Shingrix as a shot in your upper arm. Shingrix provides strong protection against shingles and PHN. Two doses of Shingrix is more than 90% effective at preventing shingles and PHN. Protection stays above 85% for at least the first four years after you get vaccinated. Shingrix is the preferred vaccine, over Zostavax  (zoster vaccine live), a shingles vaccine in use since 2006. Zostavax may still be used to prevent shingles in healthy adults 60 years and older. For example, you could use Zostavax if a person is allergic to Shingrix, prefers Zostavax, or requests immediate vaccination and Shingrix is unavailable. Who Should Get Shingrix? Healthy adults 50 years and older should get two doses of Shingrix, separated by 2 to 6 months. You should get Shingrix even if in the past you . had shingles  . received Zostavax  . are not sure if you had chickenpox There is no maximum age for getting Shingrix. If you had shingles in the past, you can get Shingrix to help prevent future occurrences of the disease. There is no specific length of time that you need to wait after having shingles before you can receive Shingrix, but generally you should make sure the shingles rash has gone away before getting vaccinated. You can get Shingrix whether or not you remember having had chickenpox in the past. Studies show that more than 99% of Americans 40 years and older have had chickenpox, even if they don't remember having the disease. Chickenpox and shingles are related because they are caused by the same virus (varicella zoster virus). After a person recovers from chickenpox, the virus stays dormant (inactive) in the body. It can reactivate years later and cause shingles. If you had Zostavax in the recent past, you should wait at least eight weeks before getting Shingrix. Talk to your healthcare provider to determine the best time to get Shingrix. Shingrix is  available in doctor's offices and pharmacies. To find doctor's offices or pharmacies near you that offer the vaccine, visit [facility] Vaccine FinderExternal. If you have questions about Shingrix, talk with your healthcare provider. Vaccine for Those 86 Years and Older  Shingrix reduces the risk of shingles and PHN by more than 90% in people 26 and older. CDC recommends the vaccine  for healthy adults 95 and older.  Who Should Not Get Shingrix? You should not get Shingrix if you: . have ever had a severe allergic reaction to any component of the vaccine or after a dose of Shingrix  . tested negative for immunity to varicella zoster virus. If you test negative, you should get chickenpox vaccine.  . currently have shingles  . currently are pregnant or breastfeeding. Women who are pregnant or breastfeeding should wait to get Shingrix.  Marland Kitchen receive specific antiviral drugs (acyclovir, famciclovir, or valacyclovir) 24 hours before vaccination (avoid use of these antiviral drugs for 14 days after vaccination)- zoster vaccine live only If you have a minor acute (starts suddenly) illness, such as a cold, you may get Shingrix. But if you have a moderate or severe acute illness, you should usually wait until you recover before getting the vaccine. This includes anyone with a temperature of 101.67F or higher. The side effects of the Shingrix are temporary, and usually last 2 to 3 days. While you may experience pain for a few days after getting Shingrix, the pain will be less severe than having shingles and the complications from the disease. How Well Does Shingrix Work? Two doses of Shingrix provides strong protection against shingles and postherpetic neuralgia (PHN), the most common complication of shingles. . In adults 63 to 56 years old who got two doses, Shingrix was 97% effective in preventing shingles; among adults 70 years and older, Shingrix was 91% effective.  . In adults 7 to 56 years old who got two doses, Shingrix was 91% effective in preventing PHN; among adults 70 years and older, Shingrix was 89% effective. Shingrix protection remained high (more than 85%) in people 70 years and older throughout the four years following vaccination. Since your risk of shingles and PHN increases as you get older, it is important to have strong protection against shingles in your older  years. Top of Page  What Are the Possible Side Effects of Shingrix? Studies show that Shingrix is safe. The vaccine helps your body create a strong defense against shingles. As a result, you are likely to have temporary side effects from getting the shots. The side effects may affect your ability to do normal daily activities for 2 to 3 days. Most people got a sore arm with mild or moderate pain after getting Shingrix, and some also had redness and swelling where they got the shot. Some people felt tired, had muscle pain, a headache, shivering, fever, stomach pain, or nausea. About 1 out of 6 people who got Shingrix experienced side effects that prevented them from doing regular activities. Symptoms went away on their own in about 2 to 3 days. Side effects were more common in younger people. You might have a reaction to the first or second dose of Shingrix, or both doses. If you experience side effects, you may choose to take over-the-counter pain medicine such as ibuprofen or acetaminophen. If you experience side effects from Shingrix, you should report them to the Vaccine Adverse Event Reporting System (VAERS). Your doctor might file this report, or you can do it yourself through the VAERS  websiteExternal, or by calling [phone]. If you have any questions about side effects from Shingrix, talk with your doctor. The shingles vaccine does not contain thimerosal (a preservative containing mercury). Top of Page  When Should I See a Doctor Because of the Side Effects I Experience From Shingrix? In clinical trials, Shingrix was not associated with serious adverse events. In fact, serious side effects from vaccines are extremely rare. For example, for every 1 million doses of a vaccine given, only one or two people may have a severe allergic reaction. Signs of an allergic reaction happen within minutes or hours after vaccination and include hives, swelling of the face and throat, difficulty breathing, a  fast heartbeat, dizziness, or weakness. If you experience these or any other life-threatening symptoms, see a doctor right away. Shingrix causes a strong response in your immune system, so it may produce short-term side effects more intense than you are used to from other vaccines. These side effects can be uncomfortable, but they are expected and usually go away on their own in 2 or 3 days. Top of Page  How Can I Pay For Shingrix? There are several ways shingles vaccine may be paid for: Medicare . Medicare Part D plans cover the shingles vaccine, but there may be a cost to you depending on your plan. There may be a copay for the vaccine, or you may need to pay in full then get reimbursed for a certain amount.  . Medicare Part B does not cover the shingles vaccine. Medicaid . Medicaid may or may not cover the vaccine. Contact your insurer to find out. Private health insurance . Many private health insurance plans will cover the vaccine, but there may be a cost to you depending on your plan. Contact your insurer to find out. Vaccine assistance programs . Some pharmaceutical companies provide vaccines to eligible adults who cannot afford them. You may want to check with the vaccine manufacturer, [employer], about Shingrix. If you do not currently have health insurance, learn more about affordable health coverage optionsExternal. To find doctor's offices or pharmacies near you that offer the vaccine, visit HealthMap Vaccine FinderExternal.

## 2020-01-30 LAB — HEMOGLOBIN A1C
Hgb A1c MFr Bld: 5.6 % of total Hgb (ref <5.7)
Mean Plasma Glucose: 114 (calc)
eAG (mmol/L): 6.3 (calc)

## 2020-01-30 LAB — CBC WITH DIFFERENTIAL/PLATELET
Absolute Monocytes: 791 cells/uL (ref 200–950)
Basophils Absolute: 41 cells/uL (ref 0–200)
Basophils Relative: 0.7 %
Eosinophils Absolute: 177 cells/uL (ref 15–500)
Eosinophils Relative: 3 %
HCT: 47.6 % (ref 38.5–50.0)
Hemoglobin: 16.1 g/dL (ref 13.2–17.1)
Lymphs Abs: 1257 cells/uL (ref 850–3900)
MCH: 31.9 pg (ref 27.0–33.0)
MCHC: 33.8 g/dL (ref 32.0–36.0)
MCV: 94.3 fL (ref 80.0–100.0)
MPV: 9.3 fL (ref 7.5–12.5)
Monocytes Relative: 13.4 %
Neutro Abs: 3634 cells/uL (ref 1500–7800)
Neutrophils Relative %: 61.6 %
Platelets: 286 10*3/uL (ref 140–400)
RBC: 5.05 10*6/uL (ref 4.20–5.80)
RDW: 12.6 % (ref 11.0–15.0)
Total Lymphocyte: 21.3 %
WBC: 5.9 10*3/uL (ref 3.8–10.8)

## 2020-01-30 LAB — COMPLETE METABOLIC PANEL WITH GFR
AG Ratio: 1.7 (calc) (ref 1.0–2.5)
ALT: 43 U/L (ref 9–46)
AST: 38 U/L — ABNORMAL HIGH (ref 10–35)
Albumin: 4.7 g/dL (ref 3.6–5.1)
Alkaline phosphatase (APISO): 65 U/L (ref 35–144)
BUN: 12 mg/dL (ref 7–25)
CO2: 27 mmol/L (ref 20–32)
Calcium: 9.5 mg/dL (ref 8.6–10.3)
Chloride: 100 mmol/L (ref 98–110)
Creat: 1.09 mg/dL (ref 0.70–1.33)
GFR, Est African American: 88 mL/min/{1.73_m2} (ref >=60)
GFR, Est Non African American: 76 mL/min/{1.73_m2} (ref >=60)
Globulin: 2.7 g/dL (calc) (ref 1.9–3.7)
Glucose, Bld: 114 mg/dL — ABNORMAL HIGH (ref 65–99)
Potassium: 4.6 mmol/L (ref 3.5–5.3)
Sodium: 137 mmol/L (ref 135–146)
Total Bilirubin: 0.6 mg/dL (ref 0.2–1.2)
Total Protein: 7.4 g/dL (ref 6.1–8.1)

## 2020-01-30 LAB — LIPID PANEL
Cholesterol: 180 mg/dL (ref <200)
HDL: 43 mg/dL (ref >=40)
LDL Cholesterol (Calc): 116 mg/dL (calc) — ABNORMAL HIGH
Non-HDL Cholesterol (Calc): 137 mg/dL (calc) — ABNORMAL HIGH (ref <130)
Total CHOL/HDL Ratio: 4.2 (calc) (ref <5.0)
Triglycerides: 107 mg/dL (ref <150)

## 2020-01-30 LAB — PSA: PSA: 0.6 ng/mL (ref <=4.0)

## 2020-02-19 NOTE — Progress Notes (Signed)
Virtual Visit via Video Note The purpose of this virtual visit is to provide medical care while limiting exposure to the novel coronavirus.    Consent was obtained for video visit:  Yes.   Answered questions that patient had about telehealth interaction:  Yes.   I discussed the limitations, risks, security and privacy concerns of performing an evaluation and management service by telemedicine. I also discussed with the patient that there may be a patient responsible charge related to this service. The patient expressed understanding and agreed to proceed.  Pt location: Home Physician Location: office Name of referring provider:  [name], MD I connected with Donald Schwartz at patients initiation/request on 02/20/2020 at  8:50 AM EDT by video enabled telemedicine application and verified that I am speaking with the correct person using two identifiers. Pt MRN:  [id number] Pt DOB:  1966/06/01 Video Participants:  Donald Schwartz   History of Present Illness:  Donald Schwartz is a 54 year old right-handed man with hypertension, CKD stage II, hyperlipidemia and OSA who follows up for right-sided trigeminal neuralgia.  UPDATE: Current medication: Oxcarbazepine 600 mg twice daily  Over the past 2 weeks, he reports twinges of pain from around his right eye down to his canine tooth.  He notes it when he is scratching his eye or drying off his face after getting out of the shower.  It is 3/10.    01/29/2020 LABS:  CBC with WBC 5.9, HGB 16.1, HCT 47.6, PLT 286; CMP with Na 137, K 4.6, Cl 100, CO2 27, Ca 9.5, glucose 114, BUN 12, Cr 1.09, t bili 0.6, ALP 65, AST 38, ALT 43.  HISTORY: Beginning in January 2016, he started having pain involving the right side of the face. It started in the right eye, then involving the right top of the head, right side of nose and right upper teeth. It is a paroxysmal shooting pain lasting a few seconds at a time and occurring several times daily. It is  triggered by lightly touching the cheek, eating or feeling of water running over his head. Sometimes there is slight tingling but usually not. Nothing relieves it. It aborts spontaneously. There is no numbness or facial weakness. He was evaluated by the eye doctor and dentist with normal exams.  Prior medications: Lyrica. 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 His insurance would not cover baclofen.  Past Medical History: Past Medical History:  Diagnosis Date  . Allergy   . Arthritis   . Elevated hemoglobin A1c   . Elevated LDL cholesterol level   . Hx of colonic polyp 11/20/2014  . Hypertension   . Hypogonadism male   . Neuromuscular disorder (HCC)    Trigeminar neuralgia  . Sleep apnea    wears CPAP  . Vitamin D deficiency     Medications: Outpatient Encounter Medications as of 02/20/2020  Medication Sig  . Ascorbic Acid (VITAMIN C) 500 MG CAPS Take 1 capsule by mouth daily.   Marland Kitchen aspirin 81 MG chewable tablet Chew 1 tablet (81 mg total) by mouth daily.  . Cholecalciferol (VITAMIN D PO) Take 10,000 Units by mouth daily.   Marland Kitchen diltiazem (CARDIZEM) 120 MG tablet Take 1 tablet by mouth once daily for blood pressure  . fluticasone (FLONASE) 50 MCG/ACT nasal spray USE ONE SPRAY EACH NOSTRIL TWICE DAILY  . Lecithin 1200 MG CAPS Take by mouth daily.  Marland Kitchen lisinopril (ZESTRIL) 40 MG tablet Take 1 tablet Daily for BP  . Magnesium  250 MG TABS Take 500 mg by mouth 2 (two) times a day.   . meloxicam (MOBIC) 15 MG tablet Take 1 tablet daily with food for pain and inflammation. Can take with tylenol, can not take with aleve, iburpofen, then as needed daily for pain  . Multiple Vitamins-Minerals (MULTIVITAMIN MEN PO) Take by mouth daily.  . Omega-3 Fatty Acids (FISH OIL) 1000 MG CAPS Take 1,200 mg by mouth. Take two tablets in the morning and two tablets in the evening  . oxcarbazepine (TRILEPTAL) 600 MG tablet Take 1 tablet by mouth twice daily  .  phentermine (ADIPEX-P) 37.5 MG tablet Take 1 tablet every Morning for Dieting & Weight Loss  . Red Yeast Rice Extract (RED YEAST RICE PO) Take 600 mg by mouth 2 (two) times daily.   . sildenafil (VIAGRA) 100 MG tablet 1/2-1 pill as needed 1 hour before intercourse, can get with good RX from [facility]  . valACYclovir (VALTREX) 1000 MG tablet Take 1 tablet Daily to Prevent Fever Blisters  . zinc gluconate 50 MG tablet Take 50 mg by mouth daily.   No facility-administered encounter medications on file as of 02/20/2020.    Allergies: Allergies  Allergen Reactions  . Toprol Xl [Metoprolol Tartrate]     ED    Family History: Family History  Problem Relation Age of Onset  . Colon cancer Maternal Uncle   . Prostate cancer Maternal Uncle   . Colon cancer Paternal Uncle   . Colon cancer Maternal Grandfather   . Colon cancer Paternal Grandfather   . Prostate cancer Father   . Stomach cancer Neg Hx   . Esophageal cancer Neg Hx     Social History: Social History   Socioeconomic History  . Marital status: Married    Spouse name: Not on file  . Number of children: 0  . Years of education: Not on file  . Highest education level: Some college, no degree  Occupational History    Employer: [employer]  Tobacco Use  . Smoking status: Former Smoker    Quit date: 09/20/1989    Years since quitting: 30.4  . Smokeless tobacco: Never Used  Substance and Sexual Activity  . Alcohol use: No    Alcohol/week: 0.0 standard drinks  . Drug use: No  . Sexual activity: Yes    Partners: Female  Other Topics Concern  . Not on file  Social History Narrative   Pt is married he lives with spouse   Has no children   Some college education    Right handed   Drinks decaf tea, no coffee, soda sometimes    Social Determinants of Radio broadcast assistant Strain:   . Difficulty of Paying Living Expenses:   Food Insecurity:   . Worried About Charity fundraiser in the Last Year:   .  Arboriculturist in the Last Year:   Transportation Needs:   . Film/video editor (Medical):   Marland Kitchen Lack of Transportation (Non-Medical):   Physical Activity:   . Days of Exercise per Week:   . Minutes of Exercise per Session:   Stress:   . Feeling of Stress :   Social Connections:   . Frequency of Communication with Friends and Family:   . Frequency of Social Gatherings with Friends and Family:   . Attends Religious Services:   . Active Member of Clubs or Organizations:   . Attends Archivist Meetings:   Marland Kitchen Marital Status:  Intimate Partner Violence:   . Fear of Current or Ex-Partner:   . Emotionally Abused:   Marland Kitchen Physically Abused:   . Sexually Abused:     Observations/Objective:   There were no vitals taken for this visit. No acute distress.  Alert and oriented.  Speech fluent and not dysarthric.  Language intact.  Eyes orthophoric on primary gaze.  Face symmetric.  Assessment and Plan:   Right-sided trigeminal neuralgia, mildly aggravated over past couple of weeks but still manageable.  1.  We have decided to continue current regimen of oxcarbazepine 600mg  twice daily for now.  If symptoms do not improve or if they get worse, he will contact me and we can increase oxcarbazepine to 750mg  twice daily.  We would have to monitor his Na levels more closely. 2.  Otherwise, follow up in 6 months.  Follow Up Instructions:    -I discussed the assessment and treatment plan with the patient. The patient was provided an opportunity to ask questions and all were answered. The patient agreed with the plan and demonstrated an understanding of the instructions.   The patient was advised to call back or seek an in-person evaluation if the symptoms worsen or if the condition fails to improve as anticipated.    Dudley Major, DO

## 2020-02-20 ENCOUNTER — Other Ambulatory Visit: Payer: Self-pay

## 2020-02-20 ENCOUNTER — Encounter: Payer: Self-pay | Admitting: Neurology

## 2020-02-20 ENCOUNTER — Telehealth (INDEPENDENT_AMBULATORY_CARE_PROVIDER_SITE_OTHER): Payer: BC Managed Care – PPO | Admitting: Neurology

## 2020-02-20 DIAGNOSIS — G500 Trigeminal neuralgia: Secondary | ICD-10-CM

## 2020-03-27 ENCOUNTER — Other Ambulatory Visit: Payer: Self-pay | Admitting: Neurology

## 2020-04-06 ENCOUNTER — Other Ambulatory Visit: Payer: Self-pay | Admitting: Internal Medicine

## 2020-04-06 DIAGNOSIS — I10 Essential (primary) hypertension: Secondary | ICD-10-CM

## 2020-04-06 DIAGNOSIS — E66812 Obesity, class 2: Secondary | ICD-10-CM

## 2020-04-06 DIAGNOSIS — Z6835 Body mass index (BMI) 35.0-35.9, adult: Secondary | ICD-10-CM

## 2020-04-19 ENCOUNTER — Other Ambulatory Visit: Payer: Self-pay | Admitting: Adult Health

## 2020-05-22 ENCOUNTER — Encounter: Payer: Self-pay | Admitting: Internal Medicine

## 2020-05-22 NOTE — Progress Notes (Signed)
Annual  Screening/Preventative Visit  & Comprehensive Evaluation & Examination     This very nice 54 y.o.  MWM presents for a Screening /Preventative Visit & comprehensive evaluation and management of multiple medical co-morbidities.  Patient has been followed for HTN, HLD, Prediabetes and Vitamin D Deficiency.Patient is followed by Dr Tomi Likens for Trigeminal Neuralgia. Also,  Patient is on CPAP for OSA with improved restorative sleep.      HTN predates circa 2002. Patient's BP has been controlled and today's BP is at goal - 122/76. Patient denies any cardiac symptoms as chest pain, palpitations, shortness of breath, dizziness or ankle swelling.     Patient's hyperlipidemia is controlled with diet, Fish Oil  and RYRE suplements. Patient denies myalgias or other medication SE's. Last lipids were not at goal with slightly elevated LDL Chol:  Lab Results  Component Value Date   CHOL 180 01/29/2020   HDL 43 01/29/2020   LDLCALC 116 (H) 01/29/2020   TRIG 107 01/29/2020   CHOLHDL 4.2 01/29/2020       Patient has Morbid obesity (BMI 37+) and consequent  prediabetes since    and patient denies reactive hypoglycemic symptoms, visual blurring, diabetic polys or paresthesias. Last A1c was Normal & at goal:  Lab Results  Component Value Date   HGBA1C 5.6 01/29/2020        Patient has been treated in the past with parenteral replacementTestosterone and has been on  with improved stamina & sense of well being, but apparently stopped it last year.       Finally, patient has history of Vitamin D Deficiency ("31" / 2008) and last vitamin D was at goal:   Lab Results  Component Value Date   VD25OH 64 10/23/2019    Current Outpatient Medications on File Prior to Visit  Medication Sig  . Ascorbic Acid (VITAMIN C) 500 MG CAPS Take 1 capsule by mouth daily.   Marland Kitchen aspirin 81 MG chewable tablet Chew 1 tablet (81 mg total) by mouth daily.  . Cholecalciferol (VITAMIN D PO) Take 10,000 Units by mouth  daily.   Marland Kitchen diltiazem (CARDIZEM) 120 MG tablet Take 1 tablet Daily for BP  . fluticasone (FLONASE) 50 MCG/ACT nasal spray USE ONE SPRAY EACH NOSTRIL TWICE DAILY  . Lecithin 1200 MG CAPS Take by mouth daily.  Marland Kitchen lisinopril (ZESTRIL) 40 MG tablet Take 1 tablet Daily for BP  . Magnesium 250 MG TABS Take 500 mg by mouth 2 (two) times a day.   . meloxicam (MOBIC) 15 MG tablet Take 1 tablet daily with food for pain and inflammation. Can take with tylenol, can not take with aleve, iburpofen, then as needed daily for pain  . Multiple Vitamins-Minerals (MULTIVITAMIN MEN PO) Take by mouth daily.  . Omega-3 Fatty Acids (FISH OIL) 1000 MG CAPS Take 1,200 mg by mouth. Take two tablets in the morning and two tablets in the evening  . oxcarbazepine (TRILEPTAL) 600 MG tablet Take 1 tablet by mouth twice daily  . phentermine (ADIPEX-P) 37.5 MG tablet Take 1 tablet every Morning for Dieting & Weight Loss  . Red Yeast Rice Extract (RED YEAST RICE PO) Take 600 mg by mouth 2 (two) times daily.   . sildenafil (VIAGRA) 100 MG tablet 1/2-1 pill as needed 1 hour before intercourse, can get with good RX from [facility]  . valACYclovir (VALTREX) 1000 MG tablet Take 1 tablet Daily to Prevent Fever Blisters  . zinc gluconate 50 MG tablet Take 50 mg by mouth daily.  No current facility-administered medications on file prior to visit.   Allergies  Allergen Reactions  . Toprol Xl [Metoprolol Tartrate]     ED   Past Medical History:  Diagnosis Date  . Allergy   . Arthritis   . Elevated hemoglobin A1c   . Elevated LDL cholesterol level   . Hx of colonic polyp 11/20/2014  . Hypertension   . Hypogonadism male   . Neuromuscular disorder (HCC)    Trigeminar neuralgia  . Sleep apnea    wears CPAP  . Vitamin D deficiency    Health Maintenance  Topic Date Due  . INFLUENZA VACCINE  04/20/2020  . COLONOSCOPY  01/30/2021  . TETANUS/TDAP  02/02/2026  . COVID-19 Vaccine  Completed  . Hepatitis C Screening  Completed    . HIV Screening  Completed   Immunization History  Administered Date(s) Administered  . Influenza Inj Mdck Quad With Preservative 06/02/2017, 06/28/2018, 07/23/2019  . Influenza Split 07/10/2014  . Influenza-Unspecified 07/19/2016  . PFIZER SARS-COV-2 Vaccination 01/03/2020, 01/28/2020  . PPD Test 01/07/2014, 03/20/2018, 04/18/2019  . Pneumococcal-Unspecified 09/20/2009  . Td 09/20/2005  . Tdap 02/03/2016   Last Colon -   Past Surgical History:  Procedure Laterality Date  . c5-6 fusion     2012  . COLONOSCOPY    . HIP FRACTURE SURGERY Right 1986   avulsion/chip fracture ?stress fracture   Family History  Problem Relation Age of Onset  . Colon cancer Maternal Uncle   . Prostate cancer Maternal Uncle   . Colon cancer Paternal Uncle   . Colon cancer Maternal Grandfather   . Colon cancer Paternal Grandfather   . Prostate cancer Father   . Stomach cancer Neg Hx   . Esophageal cancer Neg Hx    Social History   Socioeconomic History  . Marital status: Married    Spouse name: Not on file  . Number of children: 0  . Years of education: Not on file  . Highest education level: Some college, no degree  Occupational History    Employer: [employer]  Tobacco Use  . Smoking status: Former Smoker    Quit date: 09/20/1989    Years since quitting: 30.6  . Smokeless tobacco: Never Used  Vaping Use  . Vaping Use: Never used  Substance and Sexual Activity  . Alcohol use: No    Alcohol/week: 0.0 standard drinks  . Drug use: No  . Sexual activity: Yes    Partners: Female  Other Topics Concern  . Not on file  Social History Narrative   Pt is married he lives with spouse   Has no children   Some college education    Right handed   Drinks decaf tea, no coffee, soda sometimes      ROS Constitutional: Denies fever, chills, weight loss/gain, headaches, insomnia,  night sweats or change in appetite. Does c/o fatigue. Eyes: Denies redness, blurred vision, diplopia,  discharge, itchy or watery eyes.  ENT: Denies discharge, congestion, post nasal drip, epistaxis, sore throat, earache, hearing loss, dental pain, Tinnitus, Vertigo, Sinus pain or snoring.  Cardio: Denies chest pain, palpitations, irregular heartbeat, syncope, dyspnea, diaphoresis, orthopnea, PND, claudication or edema Respiratory: denies cough, dyspnea, DOE, pleurisy, hoarseness, laryngitis or wheezing.  Gastrointestinal: Denies dysphagia, heartburn, reflux, water brash, pain, cramps, nausea, vomiting, bloating, diarrhea, constipation, hematemesis, melena, hematochezia, jaundice or hemorrhoids Genitourinary: Denies dysuria, frequency, urgency, nocturia, hesitancy, discharge, hematuria or flank pain Musculoskeletal: Denies arthralgia, myalgia, stiffness, Jt. Swelling, pain, limp or strain/sprain. Denies Falls.  Skin: Denies puritis, rash, hives, warts, acne, eczema or change in skin lesion Neuro: No weakness, tremor, incoordination, spasms, paresthesia or pain Psychiatric: Denies confusion, memory loss or sensory loss. Denies Depression. Endocrine: Denies change in weight, skin, hair change, nocturia, and paresthesia, diabetic polys, visual blurring or hyper / hypo glycemic episodes.  Heme/Lymph: No excessive bleeding, bruising or enlarged lymph nodes.  Physical Exam  BP 122/76   Pulse 72   Temp (!) 97.1 F (36.2 C)   Resp 16   Ht 6\' 1"  (1.854 m)   Wt 273 lb 6.4 oz (124 kg)   BMI 36.07 kg/m   General Appearance: Well nourished and well groomed and in no apparent distress.  Eyes: PERRLA, EOMs, conjunctiva no swelling or erythema, normal fundi and vessels. Sinuses: No frontal/maxillary tenderness ENT/Mouth: EACs patent / TMs  nl. Nares clear without erythema, swelling, mucoid exudates. Oral hygiene is good. No erythema, swelling, or exudate. Tongue normal, non-obstructing. Tonsils not swollen or erythematous. Hearing normal.  Neck: Supple, thyroid not palpable. No bruits, nodes or  JVD. Respiratory: Respiratory effort normal.  BS equal and clear bilateral without rales, rhonci, wheezing or stridor. Cardio: Heart sounds are normal with regular rate and rhythm and no murmurs, rubs or gallops. Peripheral pulses are normal and equal bilaterally without edema. No aortic or femoral bruits. Chest: symmetric with normal excursions and percussion.  Abdomen: Soft, with Nl bowel sounds. Nontender, no guarding, rebound, hernias, masses, or organomegaly.  Lymphatics: Non tender without lymphadenopathy.  Musculoskeletal: Full ROM all peripheral extremities, joint stability, 5/5 strength, and normal gait. Skin: Warm and dry without rashes, lesions, cyanosis, clubbing or  ecchymosis.  Neuro: Cranial nerves intact, reflexes equal bilaterally. Normal muscle tone, no cerebellar symptoms. Sensation intact.  Pysch: Alert and oriented X 3 with normal affect, insight and judgment appropriate.   Assessment and Plan  1. Annual Preventative/Screening Exam   2. Essential hypertension  - EKG 12-Lead - [place], RETROPERITNL ABD,  LTD - Urinalysis, Routine w reflex microscopic - CBC with Differential/Platelet - COMPLETE METABOLIC PANEL WITH GFR - Magnesium - TSH - Microalbumin / creatinine urine ratio  3. Hyperlipidemia, mixed  - EKG 12-Lead - [place], RETROPERITNL ABD,  LTD - Lipid panel - TSH  4. Vitamin D deficiency  - VITAMIN D 25 Hydroxy   5. Abnormal glucose  - EKG 12-Lead - [place], RETROPERITNL ABD,  LTD - Hemoglobin A1c - Insulin, random  6. CKD (chronic kidney disease) stage 2, GFR 60-89 ml/min  - Urinalysis, Routine w reflex microscopic - COMPLETE METABOLIC PANEL WITH GFR - Microalbumin / creatinine urine ratio  7. Morbid obesity (HCC)  - TSH  8. Testosterone Deficiency  - Testosterone  9. Prostate cancer screening  - PSA  10. Screening-pulmonary TB  - TB Skin Test  11. Screening for colorectal cancer  - POC Hemoccult Bld/Stl   12. Screening for ischemic  heart disease  - EKG 12-Lead  13. FH: hypertension  - EKG 12-Lead - [place], RETROPERITNL ABD,  LTD  14. Former smoker  - EKG 12-Lead  15. Screening for AAA (aortic abdominal aneurysm)  - [place], RETROPERITNL ABD,  LTD - Iron,Total/Total Iron Binding Cap - Vitamin B12 - CBC with Differential/Platelet - TSH  16. Medication management  - Urinalysis, Routine w reflex microscopic - CBC with Differential/Platelet - COMPLETE METABOLIC PANEL WITH GFR - Magnesium - Lipid panel - TSH - Hemoglobin A1c - Insulin, random - VITAMIN D 25 Hydroxy  - Microalbumin / creatinine urine ratio  Patient was counseled in prudent diet, weight control to achieve/maintain BMI less than 25, BP monitoring, regular exercise and medications as discussed.  Discussed med effects and SE's. Routine screening labs and tests as requested with regular follow-up as recommended. Over 40 minutes of exam, counseling, chart review and high complex critical decision making was performed   Kirtland Bouchard, MD

## 2020-05-22 NOTE — Patient Instructions (Signed)

## 2020-05-23 ENCOUNTER — Ambulatory Visit (INDEPENDENT_AMBULATORY_CARE_PROVIDER_SITE_OTHER): Payer: BC Managed Care – PPO | Admitting: Internal Medicine

## 2020-05-23 ENCOUNTER — Other Ambulatory Visit: Payer: Self-pay

## 2020-05-23 VITALS — BP 122/76 | HR 72 | Temp 97.1°F | Resp 16 | Ht 73.0 in | Wt 273.4 lb

## 2020-05-23 DIAGNOSIS — Z111 Encounter for screening for respiratory tuberculosis: Secondary | ICD-10-CM

## 2020-05-23 DIAGNOSIS — E559 Vitamin D deficiency, unspecified: Secondary | ICD-10-CM

## 2020-05-23 DIAGNOSIS — R350 Frequency of micturition: Secondary | ICD-10-CM

## 2020-05-23 DIAGNOSIS — Z125 Encounter for screening for malignant neoplasm of prostate: Secondary | ICD-10-CM

## 2020-05-23 DIAGNOSIS — Z130 Encounter for screening for diseases of the blood and blood-forming organs and certain disorders involving the immune mechanism: Secondary | ICD-10-CM

## 2020-05-23 DIAGNOSIS — N401 Enlarged prostate with lower urinary tract symptoms: Secondary | ICD-10-CM

## 2020-05-23 DIAGNOSIS — Z8249 Family history of ischemic heart disease and other diseases of the circulatory system: Secondary | ICD-10-CM | POA: Diagnosis not present

## 2020-05-23 DIAGNOSIS — Z87891 Personal history of nicotine dependence: Secondary | ICD-10-CM

## 2020-05-23 DIAGNOSIS — Z13220 Encounter for screening for lipoid disorders: Secondary | ICD-10-CM

## 2020-05-23 DIAGNOSIS — Z1389 Encounter for screening for other disorder: Secondary | ICD-10-CM

## 2020-05-23 DIAGNOSIS — Z1329 Encounter for screening for other suspected endocrine disorder: Secondary | ICD-10-CM | POA: Diagnosis not present

## 2020-05-23 DIAGNOSIS — R7309 Other abnormal glucose: Secondary | ICD-10-CM

## 2020-05-23 DIAGNOSIS — Z131 Encounter for screening for diabetes mellitus: Secondary | ICD-10-CM

## 2020-05-23 DIAGNOSIS — Z0001 Encounter for general adult medical examination with abnormal findings: Secondary | ICD-10-CM

## 2020-05-23 DIAGNOSIS — I10 Essential (primary) hypertension: Secondary | ICD-10-CM | POA: Diagnosis not present

## 2020-05-23 DIAGNOSIS — Z0000 Encounter for general adult medical examination without abnormal findings: Secondary | ICD-10-CM | POA: Diagnosis not present

## 2020-05-23 DIAGNOSIS — Z79899 Other long term (current) drug therapy: Secondary | ICD-10-CM

## 2020-05-23 DIAGNOSIS — N182 Chronic kidney disease, stage 2 (mild): Secondary | ICD-10-CM

## 2020-05-23 DIAGNOSIS — Z1211 Encounter for screening for malignant neoplasm of colon: Secondary | ICD-10-CM

## 2020-05-23 DIAGNOSIS — E291 Testicular hypofunction: Secondary | ICD-10-CM

## 2020-05-23 DIAGNOSIS — Z136 Encounter for screening for cardiovascular disorders: Secondary | ICD-10-CM | POA: Diagnosis not present

## 2020-05-23 DIAGNOSIS — E782 Mixed hyperlipidemia: Secondary | ICD-10-CM

## 2020-05-24 ENCOUNTER — Other Ambulatory Visit: Payer: Self-pay | Admitting: Internal Medicine

## 2020-05-24 DIAGNOSIS — E782 Mixed hyperlipidemia: Secondary | ICD-10-CM

## 2020-05-24 MED ORDER — ROSUVASTATIN CALCIUM 20 MG PO TABS: ORAL_TABLET | ORAL | 0 refills | Status: DC

## 2020-05-24 NOTE — Progress Notes (Signed)
========================================================== -   Test results slightly outside the reference range are not unusual. If there is anything important, I will review this with you,  otherwise it is considered normal test values.  If you have further questions,  please do not hesitate to contact me at the office or via My Chart.  ==========================================================  -  Iron & Vitamin B12 levels returned Normal & OK  ==========================================================  -  PSA - Low - Great  ==========================================================  -  Total Chol = 1982 is [place], But.................  - Bad / Dangerous LDL Chol = 116  (Ideal or goal ii less than 70  !  )   - So sent new Rx for Rosuvastatin (Crestor)  (It's about 1,000 better at lowering Cholesterol than  Red Yeast Rice Extract - So.......................  you may stop the Red Yeast Rice Extract) ==========================================================  -  A1c = 5.7% - Blood sugar and A1c are again elevated in the  borderline and early or pre-diabetes range which has the same   300% increased risk for heart attack, stroke, cancer and   alzheimer- type vascular dementia as full blown diabetes.   But the good news is that diet, exercise with  weight loss can cure the early diabetes at this point. ==========================================================  -  Vitamin D = 77 - Excellent  ==========================================================  -  All Else - CBC - Kidneys - Electrolytes - Liver - Magnesium & Thyroid    - all  Normal / OK ==========================================================

## 2020-05-27 LAB — CBC WITH DIFFERENTIAL/PLATELET
Absolute Monocytes: 525 cells/uL (ref 200–950)
Basophils Absolute: 49 cells/uL (ref 0–200)
Basophils Relative: 1.2 %
Eosinophils Absolute: 250 cells/uL (ref 15–500)
Eosinophils Relative: 6.1 %
HCT: 45 % (ref 38.5–50.0)
Hemoglobin: 15.6 g/dL (ref 13.2–17.1)
Lymphs Abs: 1271 cells/uL (ref 850–3900)
MCH: 32 pg (ref 27.0–33.0)
MCHC: 34.7 g/dL (ref 32.0–36.0)
MCV: 92.4 fL (ref 80.0–100.0)
MPV: 9.2 fL (ref 7.5–12.5)
Monocytes Relative: 12.8 %
Neutro Abs: 2005 cells/uL (ref 1500–7800)
Neutrophils Relative %: 48.9 %
Platelets: 286 10*3/uL (ref 140–400)
RBC: 4.87 10*6/uL (ref 4.20–5.80)
RDW: 12.6 % (ref 11.0–15.0)
Total Lymphocyte: 31 %
WBC: 4.1 10*3/uL (ref 3.8–10.8)

## 2020-05-27 LAB — COMPLETE METABOLIC PANEL WITH GFR
AG Ratio: 1.5 (calc) (ref 1.0–2.5)
ALT: 38 U/L (ref 9–46)
AST: 34 U/L (ref 10–35)
Albumin: 4.6 g/dL (ref 3.6–5.1)
Alkaline phosphatase (APISO): 66 U/L (ref 35–144)
BUN: 10 mg/dL (ref 7–25)
CO2: 24 mmol/L (ref 20–32)
Calcium: 9.7 mg/dL (ref 8.6–10.3)
Chloride: 96 mmol/L — ABNORMAL LOW (ref 98–110)
Creat: 1.16 mg/dL (ref 0.70–1.33)
GFR, Est African American: 81 mL/min/{1.73_m2} (ref >=60)
GFR, Est Non African American: 70 mL/min/{1.73_m2} (ref >=60)
Globulin: 3 g/dL (calc) (ref 1.9–3.7)
Glucose, Bld: 105 mg/dL — ABNORMAL HIGH (ref 65–99)
Potassium: 4.3 mmol/L (ref 3.5–5.3)
Sodium: 131 mmol/L — ABNORMAL LOW (ref 135–146)
Total Bilirubin: 0.5 mg/dL (ref 0.2–1.2)
Total Protein: 7.6 g/dL (ref 6.1–8.1)

## 2020-05-27 LAB — HEMOGLOBIN A1C
Hgb A1c MFr Bld: 5.7 % of total Hgb — ABNORMAL HIGH (ref <5.7)
Mean Plasma Glucose: 117 (calc)
eAG (mmol/L): 6.5 (calc)

## 2020-05-27 LAB — MICROALBUMIN / CREATININE URINE RATIO
Creatinine, Urine: 79 mg/dL (ref 20–320)
Microalb Creat Ratio: 4 mcg/mg creat (ref <30)
Microalb, Ur: 0.3 mg/dL

## 2020-05-27 LAB — URINALYSIS, ROUTINE W REFLEX MICROSCOPIC
Bilirubin Urine: NEGATIVE
Glucose, UA: NEGATIVE
Hgb urine dipstick: NEGATIVE
Ketones, ur: NEGATIVE
Leukocytes,Ua: NEGATIVE
Nitrite: NEGATIVE
Protein, ur: NEGATIVE
Specific Gravity, Urine: 1.011 (ref 1.001–1.03)
pH: 6.5 (ref 5.0–8.0)

## 2020-05-27 LAB — INSULIN, RANDOM: Insulin: 27.9 u[IU]/mL — ABNORMAL HIGH

## 2020-05-27 LAB — TESTOSTERONE: Testosterone: 727 ng/dL (ref 250–827)

## 2020-05-27 LAB — IRON, TOTAL/TOTAL IRON BINDING CAP
%SAT: 22 % (calc) (ref 20–48)
Iron: 85 ug/dL (ref 50–180)
TIBC: 381 mcg/dL (calc) (ref 250–425)

## 2020-05-27 LAB — LIPID PANEL
Cholesterol: 182 mg/dL (ref <200)
HDL: 45 mg/dL (ref >=40)
LDL Cholesterol (Calc): 116 mg/dL (calc) — ABNORMAL HIGH
Non-HDL Cholesterol (Calc): 137 mg/dL (calc) — ABNORMAL HIGH (ref <130)
Total CHOL/HDL Ratio: 4 (calc) (ref <5.0)
Triglycerides: 107 mg/dL (ref <150)

## 2020-05-27 LAB — VITAMIN B12: Vitamin B-12: 634 pg/mL (ref 200–1100)

## 2020-05-27 LAB — MAGNESIUM: Magnesium: 2.2 mg/dL (ref 1.5–2.5)

## 2020-05-27 LAB — VITAMIN D 25 HYDROXY (VIT D DEFICIENCY, FRACTURES): Vit D, 25-Hydroxy: 77 ng/mL (ref 30–100)

## 2020-05-27 LAB — TSH: TSH: 2.13 mIU/L (ref 0.40–4.50)

## 2020-05-27 LAB — PSA: PSA: 0.6 ng/mL (ref <=4.0)

## 2020-07-10 ENCOUNTER — Other Ambulatory Visit: Payer: Self-pay | Admitting: Internal Medicine

## 2020-07-10 DIAGNOSIS — Z6835 Body mass index (BMI) 35.0-35.9, adult: Secondary | ICD-10-CM

## 2020-07-18 ENCOUNTER — Other Ambulatory Visit: Payer: Self-pay

## 2020-07-18 MED ORDER — DILTIAZEM HCL 120 MG PO TABS: ORAL_TABLET | ORAL | 0 refills | Status: DC

## 2020-08-08 ENCOUNTER — Other Ambulatory Visit: Payer: Self-pay

## 2020-08-08 MED ORDER — PHENTERMINE HCL 37.5 MG PO TABS: ORAL_TABLET | ORAL | 2 refills | Status: DC

## 2020-08-19 NOTE — Progress Notes (Signed)
NEUROLOGY FOLLOW UP OFFICE NOTE  Donald Schwartz [id number]   Subjective:  Donald Schwartz is a 54 year old right-handed man with hypertension, CKD stage II, hyperlipidemia and OSA who follows up for right-sided trigeminal neuralgia  UPDATE: Current medication: oxcarbazepine 600mg  BID (900mg  twice daily caused significant hyponatremia)  In June, a few days after we spoke, he had a flare up that lasted until September.  He has been doing well now.  He would like to try reducing dose to 300mg  twice a day.  05/23/2020 LABS:  CMP with Na 131, K 4.3, Cl 96, CO2 24, glucose 105, BUN 10, Cr 1.16, t bili 0.5, ALP 66, AST 34, ALT 38; CBC with WBC 4.1, HGB 15.6, HCT 45, PLT 286  HISTORY: Beginning in January 2016, he started having pain involving the right side of the face. It started in the right eye, then involving the right top of the head, right side of nose and right upper teeth. It is a paroxysmal shooting pain lasting a few seconds at a time and occurring several times daily. It is triggered by lightly touching the cheek, eating or feeling of water running over his head. Sometimes there is slight tingling but usually not. Nothing relieves it. It aborts spontaneously. There is no numbness or facial weakness. He was evaluated by the eye doctor and dentist with normal exams.  Prior medications, Lyrica. 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 His insurance would not cover baclofen.  PAST MEDICAL HISTORY: Past Medical History:  Diagnosis Date  . Allergy   . Arthritis   . Elevated hemoglobin A1c   . Elevated LDL cholesterol level   . Hx of colonic polyp 11/20/2014  . Hypertension   . Hypogonadism male   . Neuromuscular disorder (HCC)    Trigeminar neuralgia  . Sleep apnea    wears CPAP  . Vitamin D deficiency     MEDICATIONS: Current Outpatient Medications on File Prior to Visit  Medication Sig Dispense Refill  . Ascorbic Acid  (VITAMIN C) 500 MG CAPS Take 1 capsule by mouth daily.     Marland Kitchen aspirin 81 MG chewable tablet Chew 1 tablet (81 mg total) by mouth daily.    . Cholecalciferol (VITAMIN D PO) Take 10,000 Units by mouth daily.     Marland Kitchen diltiazem (CARDIZEM) 120 MG tablet Take 1 tablet Daily for BP 90 tablet 0  . fluticasone (FLONASE) 50 MCG/ACT nasal spray USE ONE SPRAY EACH NOSTRIL TWICE DAILY 16 g 3  . Lecithin 1200 MG CAPS Take by mouth daily.    Marland Kitchen lisinopril (ZESTRIL) 40 MG tablet Take 1 tablet Daily for BP 90 tablet 0  . Magnesium 250 MG TABS Take 500 mg by mouth 2 (two) times a day.     . meloxicam (MOBIC) 15 MG tablet Take 1 tablet daily with food for pain and inflammation. Can take with tylenol, can not take with aleve, iburpofen, then as needed daily for pain 90 tablet 3  . Multiple Vitamins-Minerals (MULTIVITAMIN MEN PO) Take by mouth daily.    . Omega-3 Fatty Acids (FISH OIL) 1000 MG CAPS Take 1,200 mg by mouth. Take two tablets in the morning and two tablets in the evening    . oxcarbazepine (TRILEPTAL) 600 MG tablet Take 1 tablet by mouth twice daily 60 tablet 5  . phentermine (ADIPEX-P) 37.5 MG tablet Take 1 tablet every Morning for Dieting & Weight Loss 30 tablet 2  . rosuvastatin (CRESTOR) 20 MG tablet Take  1 tablet    Daily   for Cholesterol 90 tablet 0  . sildenafil (VIAGRA) 100 MG tablet 1/2-1 pill as needed 1 hour before intercourse, can get with good RX from [facility] 10 tablet 2  . valACYclovir (VALTREX) 1000 MG tablet Take 1 tablet Daily to Prevent Fever Blisters 90 tablet 3  . zinc gluconate 50 MG tablet Take 50 mg by mouth daily.     No current facility-administered medications on file prior to visit.    ALLERGIES: Allergies  Allergen Reactions  . Toprol Xl [Metoprolol Tartrate]     ED    FAMILY HISTORY: Family History  Problem Relation Age of Onset  . Colon cancer Maternal Uncle   . Prostate cancer Maternal Uncle   . Colon cancer Paternal Uncle   . Colon cancer Maternal  Grandfather   . Colon cancer Paternal Grandfather   . Prostate cancer Father   . Stomach cancer Neg Hx   . Esophageal cancer Neg Hx    SOCIAL HISTORY: Social History   Socioeconomic History  . Marital status: Married    Spouse name: Not on file  . Number of children: 0  . Years of education: Not on file  . Highest education level: Some college, no degree  Occupational History    Employer: [employer]  Tobacco Use  . Smoking status: Former Smoker    Quit date: 09/20/1989    Years since quitting: 30.9  . Smokeless tobacco: Never Used  Vaping Use  . Vaping Use: Never used  Substance and Sexual Activity  . Alcohol use: No    Alcohol/week: 0.0 standard drinks  . Drug use: No  . Sexual activity: Yes    Partners: Female  Other Topics Concern  . Not on file  Social History Narrative   Pt is married he lives with spouse   Has no children   Some college education    Right handed   Drinks decaf tea, no coffee, soda sometimes    Social Determinants of Health   Financial Resource Strain:   . Difficulty of Paying Living Expenses: Not on file  Food Insecurity:   . Worried About Charity fundraiser in the Last Year: Not on file  . Ran Out of Food in the Last Year: Not on file  Transportation Needs:   . Lack of Transportation (Medical): Not on file  . Lack of Transportation (Non-Medical): Not on file  Physical Activity:   . Days of Exercise per Week: Not on file  . Minutes of Exercise per Session: Not on file  Stress:   . Feeling of Stress : Not on file  Social Connections:   . Frequency of Communication with Friends and Family: Not on file  . Frequency of Social Gatherings with Friends and Family: Not on file  . Attends Religious Services: Not on file  . Active Member of Clubs or Organizations: Not on file  . Attends Archivist Meetings: Not on file  . Marital Status: Not on file  Intimate Partner Violence:   . Fear of Current or Ex-Partner: Not on  file  . Emotionally Abused: Not on file  . Physically Abused: Not on file  . Sexually Abused: Not on file     Objective:  Blood pressure 111/70, pulse 96, height 6\' 1"  (1.854 m), weight 276 lb 9.6 oz (125.5 kg), SpO2 99 %. General: No acute distress.  Patient appears well-groomed.   Head:  Normocephalic/atraumatic Eyes:  Fundi examined  but not visualized Neck: supple, no paraspinal tenderness, full range of motion Heart:  Regular rate and rhythm Lungs:  Clear to auscultation bilaterally Back: No paraspinal tenderness Neurological Exam: alert and oriented to person, place, and time. Attention span and concentration intact, recent and remote memory intact, fund of knowledge intact.  Speech fluent and not dysarthric, language intact.  CN II-XII intact. Bulk and tone normal, muscle strength 5/5 throughout.  Sensation to light touch, temperature and vibration intact.  Deep tendon reflexes 2+ throughout, toes downgoing.  Finger to nose and heel to shin testing intact.  Gait normal, Romberg negative.   Assessment/Plan:   Right-sided trigeminal neuralgia.  Now doing well.  He would like to try reducing dose.    1.  Decrease oxcarbazepine to 300mg  twice daily. If he has recurrent flare up, will increase back to 600mg  twice daily 2.  Follow up 6 months.  Metta Clines, DO  CC: [name], MD

## 2020-08-20 ENCOUNTER — Other Ambulatory Visit: Payer: Self-pay | Admitting: Internal Medicine

## 2020-08-20 DIAGNOSIS — E782 Mixed hyperlipidemia: Secondary | ICD-10-CM

## 2020-08-21 ENCOUNTER — Ambulatory Visit: Payer: BC Managed Care – PPO | Admitting: Neurology

## 2020-08-21 ENCOUNTER — Other Ambulatory Visit: Payer: Self-pay

## 2020-08-21 ENCOUNTER — Encounter: Payer: Self-pay | Admitting: Neurology

## 2020-08-21 VITALS — BP 111/70 | HR 96 | Ht 73.0 in | Wt 276.6 lb

## 2020-08-21 DIAGNOSIS — G500 Trigeminal neuralgia: Secondary | ICD-10-CM | POA: Diagnosis not present

## 2020-08-21 MED ORDER — OXCARBAZEPINE 600 MG PO TABS
ORAL_TABLET | ORAL | 5 refills | Status: DC
Start: 2020-08-21 — End: 2021-02-19

## 2020-08-21 NOTE — Patient Instructions (Signed)
1.  Take 300mg  oxcarbazepine twice daily.  If facial pain returns, contact me and increase back to 600mg  twice daily 2.  Follow up 6 months.

## 2020-10-09 ENCOUNTER — Other Ambulatory Visit: Payer: Self-pay | Admitting: Adult Health

## 2020-10-09 ENCOUNTER — Other Ambulatory Visit: Payer: Self-pay | Admitting: Internal Medicine

## 2020-10-20 ENCOUNTER — Other Ambulatory Visit: Payer: Self-pay | Admitting: Adult Health

## 2020-11-11 ENCOUNTER — Other Ambulatory Visit: Payer: Self-pay

## 2020-11-11 ENCOUNTER — Other Ambulatory Visit: Payer: Self-pay | Admitting: Neurology

## 2020-11-11 DIAGNOSIS — Z79899 Other long term (current) drug therapy: Secondary | ICD-10-CM

## 2020-11-11 NOTE — Progress Notes (Signed)
BMP Ordered per Dr.Jaffe for Medication Management.

## 2020-11-25 ENCOUNTER — Encounter: Payer: Self-pay | Admitting: Adult Health

## 2020-11-25 ENCOUNTER — Other Ambulatory Visit: Payer: Self-pay

## 2020-11-25 ENCOUNTER — Ambulatory Visit (INDEPENDENT_AMBULATORY_CARE_PROVIDER_SITE_OTHER): Payer: BC Managed Care – PPO | Admitting: Adult Health

## 2020-11-25 VITALS — BP 132/84 | HR 79 | Temp 97.3°F | Wt 287.0 lb

## 2020-11-25 DIAGNOSIS — I10 Essential (primary) hypertension: Secondary | ICD-10-CM | POA: Diagnosis not present

## 2020-11-25 DIAGNOSIS — N182 Chronic kidney disease, stage 2 (mild): Secondary | ICD-10-CM | POA: Diagnosis not present

## 2020-11-25 DIAGNOSIS — Z79899 Other long term (current) drug therapy: Secondary | ICD-10-CM | POA: Diagnosis not present

## 2020-11-25 DIAGNOSIS — R7301 Impaired fasting glucose: Secondary | ICD-10-CM

## 2020-11-25 DIAGNOSIS — E782 Mixed hyperlipidemia: Secondary | ICD-10-CM

## 2020-11-25 DIAGNOSIS — E559 Vitamin D deficiency, unspecified: Secondary | ICD-10-CM

## 2020-11-25 DIAGNOSIS — Z9989 Dependence on other enabling machines and devices: Secondary | ICD-10-CM

## 2020-11-25 DIAGNOSIS — R7309 Other abnormal glucose: Secondary | ICD-10-CM

## 2020-11-25 DIAGNOSIS — G4733 Obstructive sleep apnea (adult) (pediatric): Secondary | ICD-10-CM | POA: Diagnosis not present

## 2020-11-25 MED ORDER — OZEMPIC (0.25 OR 0.5 MG/DOSE) 2 MG/1.5ML ~~LOC~~ SOPN: PEN_INJECTOR | SUBCUTANEOUS | 0 refills | Status: DC

## 2020-11-25 NOTE — Progress Notes (Signed)
FOLLOW UP  Assessment and Plan:   Hypertension Well controlled with current medications  Monitor blood pressure at home; patient to call if consistently greater than 130/80 Continue DASH diet.   Reminder to go to the ER if any CP, SOB, nausea, dizziness, severe HA, changes vision/speech, left arm numbness and tingling and jaw pain.  Cholesterol Newly on rosuvastatin 20 mg daily LDL goal <100 Continue low cholesterol diet and exercise.  Check lipid panel.   Prediabetes Continue diet and exercise.  Perform daily foot/skin check, notify office of any concerning changes.  Check A1C  CKD II Increase fluids, avoid NSAIDS, monitor sugars, will monitor CMP/BMP/GFR  Morbid obesity - BMI 30+ with OSA  Long discussion about weight loss, diet, and exercise Recommended diet heavy in fruits and veggies and low in animal meats, cheeses, and dairy products, appropriate calorie intake Lack of benefit with phentermine; discussed possible benefit with semaglutide; he wishes to proceed with trial; sent in 0.25/0.5 mg pends with titration instructions Discussed ideal weight for height, goal weight <260 lb to begin Will follow up in 3 months  Vitamin D Def At goal at last visit; continue supplementation for goal of 60-100 Defer Vit D level  Hx of Hypogonadism Was WNL at recent check;  Continues with sildenafil 50 mg PRN  Bilateral knee pain Established with ortho; ortho follows; meloxicam PRN  Continue diet and meds as discussed. Further disposition pending results of labs. Discussed med's effects and SE's.   Over 30 minutes of exam, counseling, chart review, and critical decision making was performed.   Future Appointments  Date Time Provider Elliott  02/19/2021  9:50 AM Pieter Partridge, DO [facility] None  06/05/2021 10:00 AM [name], MD [facility] None     ----------------------------------------------------------------------------------------------------------------------  HPI 55 y.o. male  presents for 3 month follow up on hypertension, cholesterol, prediabetes, obesity, hypogonadism and vitamin D deficiency.   He is on oxcarbazepine per neurology for trigeminal neuralgia; reports recently well controlled; he has mild persistent hyponatremia attributed to this which has been stable.   BMI is Body mass index is 37.87 kg/m., he is working on diet and exercise, was walking 2-3 miles but dog not doing as much, down to 1 mile, just ran a 5k  Continue working on portion sizes, he feels heeds to cut down on starches  he is prescribed phentermine for weight loss.  While on the medication they have lost 0 lbs since last visit. They deny palpitations, anxiety, trouble sleeping, elevated BP. Has taken breaks off med. Feels this has not been working recently.  "I just never feel like I get full."  He has OSA on CPAP. Bil knee pain, gets injections by ortho. Likely fatty liver per [place] in 2016.  Wt Readings from Last 3 Encounters:  11/25/20 287 lb (130.2 kg)  08/21/20 276 lb 9.6 oz (125.5 kg)  05/23/20 273 lb 6.4 oz (124 kg)   His blood pressure has been controlled at home, today their BP is BP: 132/84  He does workout. He denies chest pain, shortness of breath, dizziness.   He is on cholesterol medication (now on rosuvastatin 20 mg daily, previously RYRS only) and denies myalgias. His LDL/total cholesterol cholesterol is not at goal of <100. The cholesterol last visit was:   Lab Results  Component Value Date   CHOL 182 05/23/2020   HDL 45 05/23/2020   LDLCALC 116 (H) 05/23/2020   TRIG 107 05/23/2020   CHOLHDL 4.0 05/23/2020    He  has been working on diet and exercise for prediabetes, and denies foot ulcerations, increased appetite, nausea, paresthesia of the feet, polydipsia, polyuria, visual disturbances, vomiting and weight loss. Last A1C in  the office was:  Lab Results  Component Value Date   HGBA1C 5.7 (H) 05/23/2020   CKD II monitored here. Last GFR:  Lab Results  Component Value Date   GFRNONAA 4 05/23/2020   Patient is on Vitamin D supplement and at goal at recent check:    Lab Results  Component Value Date   VD25OH 77 05/23/2020     He has a history of testosterone deficiency and was on testosterone replacement with perceived benefit, however at previous check was at goal off of supplement and has not been restarted. He is prescribed sildenafil 100 mg tabs, taking 1/2 tab when needed but not using much.  Lab Results  Component Value Date   TESTOSTERONE 727 05/23/2020      Current Medications:  Current Outpatient Medications on File Prior to Visit  Medication Sig  . Ascorbic Acid (VITAMIN C) 500 MG CAPS Take 1 capsule by mouth daily.   Marland Kitchen aspirin 81 MG chewable tablet Chew 1 tablet (81 mg total) by mouth daily.  . Cholecalciferol (VITAMIN D PO) Take 10,000 Units by mouth daily.   Marland Kitchen diltiazem (CARDIZEM) 120 MG tablet Take 1 tablet by mouth once daily for blood pressure  . fluticasone (FLONASE) 50 MCG/ACT nasal spray USE ONE SPRAY EACH NOSTRIL TWICE DAILY  . Lecithin 1200 MG CAPS Take by mouth daily.  Marland Kitchen lisinopril (ZESTRIL) 40 MG tablet Take 1 tablet by mouth once daily for blood pressure  . Magnesium 250 MG TABS Take 500 mg by mouth 2 (two) times a day.   . meloxicam (MOBIC) 15 MG tablet Take 1 tablet daily with food for pain and inflammation. Can take with tylenol, can not take with aleve, iburpofen, then as needed daily for pain  . Multiple Vitamins-Minerals (MULTIVITAMIN MEN PO) Take by mouth daily.  . Omega-3 Fatty Acids (FISH OIL) 1000 MG CAPS Take 1,200 mg by mouth. Take two tablets in the morning and two tablets in the evening  . oxcarbazepine (TRILEPTAL) 600 MG tablet Take 1/2 tablet twice daily  . phentermine (ADIPEX-P) 37.5 MG tablet Take 1 tablet every Morning for Dieting & Weight Loss  .  rosuvastatin (CRESTOR) 20 MG tablet TAKE 1 TABLET BY MOUTH ONCE DAILY FOR CHOLESTEROL  . sildenafil (VIAGRA) 100 MG tablet 1/2-1 pill as needed 1 hour before intercourse, can get with good RX from [employer]  . valACYclovir (VALTREX) 1000 MG tablet Take 1 tablet Daily to Prevent Fever Blisters (Patient taking differently: as needed. Take 1 tablet Daily to Prevent Fever Blisters)  . zinc gluconate 50 MG tablet Take 50 mg by mouth daily.   No current facility-administered medications on file prior to visit.    Allergies:  Allergies  Allergen Reactions  . Toprol Xl [Metoprolol Tartrate]     ED     Medical History:  Past Medical History:  Diagnosis Date  . Allergy   . Arthritis   . Elevated hemoglobin A1c   . Elevated LDL cholesterol level   . Hx of colonic polyp 11/20/2014  . Hypertension   . Hypogonadism male   . Neuromuscular disorder (HCC)    Trigeminar neuralgia  . Sleep apnea    wears CPAP  . Vitamin D deficiency    Family history- Reviewed and unchanged Social history- Reviewed and unchanged   Review  of Systems:  Review of Systems  Constitutional: Negative for malaise/fatigue and weight loss.  HENT: Negative for hearing loss and tinnitus.   Eyes: Negative for blurred vision and double vision.  Respiratory: Negative for cough, shortness of breath and wheezing.   Cardiovascular: Negative for chest pain, palpitations, orthopnea, claudication and leg swelling.  Gastrointestinal: Negative for abdominal pain, blood in stool, constipation, diarrhea, heartburn, melena, nausea and vomiting.  Genitourinary: Negative.   Musculoskeletal: Positive for joint pain (bil knee pain intermittently ). Negative for myalgias.  Skin: Negative for rash.  Neurological: Negative for dizziness, tingling, sensory change, weakness and headaches.  Endo/Heme/Allergies: Negative for polydipsia.  Psychiatric/Behavioral: Negative.   All other systems reviewed and are negative.   Physical  Exam: BP 132/84   Pulse 79   Temp (!) 97.3 F (36.3 C)   Wt 287 lb (130.2 kg)   SpO2 98%   BMI 37.87 kg/m  Wt Readings from Last 3 Encounters:  11/25/20 287 lb (130.2 kg)  08/21/20 276 lb 9.6 oz (125.5 kg)  05/23/20 273 lb 6.4 oz (124 kg)   General Appearance: Well nourished, in no apparent distress. Eyes: PERRLA, EOMs, conjunctiva no swelling or erythema Sinuses: No Frontal/maxillary tenderness ENT/Mouth: Ext aud canals clear, TMs without erythema, bulging. No erythema, swelling, or exudate on post pharynx.  Tonsils not swollen or erythematous. Hearing normal.  Neck: Supple, thyroid normal.  Respiratory: Respiratory effort normal, BS equal bilaterally without rales, rhonchi, wheezing or stridor.  Cardio: RRR with no MRGs. Brisk peripheral pulses without edema.  Abdomen: Soft, + BS.  Non tender, no guarding, rebound, hernias, masses. Lymphatics: Non tender without lymphadenopathy.  Musculoskeletal: Full ROM, 5/5 strength, Normal gait.  Skin: Warm, dry without rashes, lesions, ecchymosis.  Neuro: Cranial nerves intact. No cerebellar symptoms.  Psych: Awake and oriented X 3, normal affect, Insight and Judgment appropriate.    Izora Ribas, NP 4:49 PM [facility]

## 2020-11-25 NOTE — Patient Instructions (Addendum)
Goals    . Exercise 150 min/wk Moderate Activity     Try HIIT (high intensity interval training) instead of steady state cardio Focus on weights/resistance exercise for better basal metabolic benefits    . LDL CALC < 100    . Weight (lb) < 260 lb (117.9 kg)       Please start ozempic once weekly injection for appetite, sugars and weight loss  0.25 mg weekly x 4 weeks Then 0.5 mg weekly if doing well  If tolerating well please contact for next dose up - 1 mg/week which is max dose, typically see the most weight loss at this dose  Most common side effects are constipation and nausea; eating smaller meals helps  Can do miralax for constipation if needed, plenty of water and fiber (beans, seeds, veggies)   Semaglutide injection solution What is this medicine? SEMAGLUTIDE (Sem a GLOO tide) is used to improve blood sugar control in adults with type 2 diabetes. This medicine may be used with other diabetes medicines. This drug may also reduce the risk of heart attack or stroke if you have type 2 diabetes and risk factors for heart disease. This medicine may be used for other purposes; ask your health care provider or pharmacist if you have questions. COMMON BRAND NAME(S): OZEMPIC What should I tell my health care provider before I take this medicine? They need to know if you have any of these conditions:  endocrine tumors (MEN 2) or if someone in your family had these tumors  eye disease, vision problems  history of pancreatitis  kidney disease  stomach problems  thyroid cancer or if someone in your family had thyroid cancer  an unusual or allergic reaction to semaglutide, other medicines, foods, dyes, or preservatives  pregnant or trying to get pregnant  breast-feeding How should I use this medicine? This medicine is for injection under the skin of your upper leg (thigh), stomach area, or upper arm. It is given once every week (every 7 days). You will be taught how to  prepare and give this medicine. Use exactly as directed. Take your medicine at regular intervals. Do not take it more often than directed. If you use this medicine with insulin, you should inject this medicine and the insulin separately. Do not mix them together. Do not give the injections right next to each other. Change (rotate) injection sites with each injection. It is important that you put your used needles and syringes in a special sharps container. Do not put them in a trash can. If you do not have a sharps container, call your pharmacist or healthcare provider to get one. A special MedGuide will be given to you by the pharmacist with each prescription and refill. Be sure to read this information carefully each time. This drug comes with INSTRUCTIONS FOR USE. Ask your pharmacist for directions on how to use this drug. Read the information carefully. Talk to your pharmacist or health care provider if you have questions. Talk to your pediatrician regarding the use of this medicine in children. Special care may be needed. Overdosage: If you think you have taken too much of this medicine contact a poison control center or emergency room at once. NOTE: This medicine is only for you. Do not share this medicine with others. What if I miss a dose? If you miss a dose, take it as soon as you can within 5 days after the missed dose. Then take your next dose at your regular weekly time.  If it has been longer than 5 days after the missed dose, do not take the missed dose. Take the next dose at your regular time. Do not take double or extra doses. If you have questions about a missed dose, contact your health care provider for advice. What may interact with this medicine?  other medicines for diabetes Many medications may cause changes in blood sugar, these include:  alcohol containing beverages  antiviral medicines for HIV or AIDS  aspirin and aspirin-like drugs  certain medicines for blood pressure,  heart disease, irregular heart beat  chromium  diuretics  male hormones, such as estrogens or progestins, birth control pills  fenofibrate  gemfibrozil  isoniazid  lanreotide  male hormones or anabolic steroids  MAOIs like Carbex, Eldepryl, Marplan, Nardil, and Parnate  medicines for weight loss  medicines for allergies, asthma, cold, or cough  medicines for depression, anxiety, or psychotic disturbances  niacin  nicotine  NSAIDs, medicines for pain and inflammation, like ibuprofen or naproxen  octreotide  pasireotide  pentamidine  phenytoin  probenecid  quinolone antibiotics such as ciprofloxacin, levofloxacin, ofloxacin  some herbal dietary supplements  steroid medicines such as prednisone or cortisone  sulfamethoxazole; trimethoprim  thyroid hormones Some medications can hide the warning symptoms of low blood sugar (hypoglycemia). You may need to monitor your blood sugar more closely if you are taking one of these medications. These include:  beta-blockers, often used for high blood pressure or heart problems (examples include atenolol, metoprolol, propranolol)  clonidine  guanethidine  reserpine This list may not describe all possible interactions. Give your health care provider a list of all the medicines, herbs, non-prescription drugs, or dietary supplements you use. Also tell them if you smoke, drink alcohol, or use illegal drugs. Some items may interact with your medicine. What should I watch for while using this medicine? Visit your doctor or health care professional for regular checks on your progress. Drink plenty of fluids while taking this medicine. Check with your doctor or health care professional if you get an attack of severe diarrhea, nausea, and vomiting. The loss of too much body fluid can make it dangerous for you to take this medicine. A test called the HbA1C (A1C) will be monitored. This is a simple blood test. It measures your  blood sugar control over the last 2 to 3 months. You will receive this test every 3 to 6 months. Learn how to check your blood sugar. Learn the symptoms of low and high blood sugar and how to manage them. Always carry a quick-source of sugar with you in case you have symptoms of low blood sugar. Examples include hard sugar candy or glucose tablets. Make sure others know that you can choke if you eat or drink when you develop serious symptoms of low blood sugar, such as seizures or unconsciousness. They must get medical help at once. Tell your doctor or health care professional if you have high blood sugar. You might need to change the dose of your medicine. If you are sick or exercising more than usual, you might need to change the dose of your medicine. Do not skip meals. Ask your doctor or health care professional if you should avoid alcohol. Many nonprescription cough and cold products contain sugar or alcohol. These can affect blood sugar. Pens should never be shared. Even if the needle is changed, sharing may result in passing of viruses like hepatitis or HIV. Wear a medical ID bracelet or chain, and carry a card that  describes your disease and details of your medicine and dosage times. Do not become pregnant while taking this medicine. Women should inform their doctor if they wish to become pregnant or think they might be pregnant. There is a potential for serious side effects to an unborn child. Talk to your health care professional or pharmacist for more information. What side effects may I notice from receiving this medicine? Side effects that you should report to your doctor or health care professional as soon as possible:  allergic reactions like skin rash, itching or hives, swelling of the face, lips, or tongue  breathing problems  changes in vision  diarrhea that continues or is severe  lump or swelling on the neck  severe nausea  signs and symptoms of infection like fever or  chills; cough; sore throat; pain or trouble passing urine  signs and symptoms of low blood sugar such as feeling anxious, confusion, dizziness, increased hunger, unusually weak or tired, sweating, shakiness, cold, irritable, headache, blurred vision, fast heartbeat, loss of consciousness  signs and symptoms of kidney injury like trouble passing urine or change in the amount of urine  trouble swallowing  unusual stomach upset or pain  vomiting Side effects that usually do not require medical attention (report to your doctor or health care professional if they continue or are bothersome):  constipation  diarrhea  nausea  pain, redness, or irritation at site where injected  stomach upset This list may not describe all possible side effects. Call your doctor for medical advice about side effects. You may report side effects to FDA at 1-800-FDA-1088. Where should I keep my medicine? Keep out of the reach of children. Store unopened pens in a refrigerator between 2 and 8 degrees C (36 and 46 degrees F). Do not freeze. Protect from light and heat. After you first use the pen, it can be stored for 56 days at room temperature between 15 and 30 degrees C (59 and 86 degrees F) or in a refrigerator. Throw away your used pen after 56 days or after the expiration date, whichever comes first. Do not store your pen with the needle attached. If the needle is left on, medicine may leak from the pen. NOTE: This sheet is a summary. It may not cover all possible information. If you have questions about this medicine, talk to your doctor, pharmacist, or health care provider.  2021 Elsevier/Gold Standard (2019-05-22 09:41:51)

## 2020-11-26 ENCOUNTER — Other Ambulatory Visit: Payer: Self-pay | Admitting: Adult Health

## 2020-11-26 ENCOUNTER — Encounter: Payer: Self-pay | Admitting: Adult Health

## 2020-11-26 DIAGNOSIS — E782 Mixed hyperlipidemia: Secondary | ICD-10-CM

## 2020-11-26 DIAGNOSIS — K760 Fatty (change of) liver, not elsewhere classified: Secondary | ICD-10-CM | POA: Insufficient documentation

## 2020-11-26 LAB — LIPID PANEL
Cholesterol: 105 mg/dL (ref <200)
HDL: 45 mg/dL (ref >=40)
LDL Cholesterol (Calc): 44 mg/dL (calc)
Non-HDL Cholesterol (Calc): 60 mg/dL (calc) (ref <130)
Total CHOL/HDL Ratio: 2.3 (calc) (ref <5.0)
Triglycerides: 82 mg/dL (ref <150)

## 2020-11-26 LAB — MAGNESIUM: Magnesium: 2.1 mg/dL (ref 1.5–2.5)

## 2020-11-26 LAB — CBC WITH DIFFERENTIAL/PLATELET
Absolute Monocytes: 684 cells/uL (ref 200–950)
Basophils Absolute: 52 cells/uL (ref 0–200)
Basophils Relative: 0.9 %
Eosinophils Absolute: 168 cells/uL (ref 15–500)
Eosinophils Relative: 2.9 %
HCT: 46.4 % (ref 38.5–50.0)
Hemoglobin: 15.7 g/dL (ref 13.2–17.1)
Lymphs Abs: 1508 cells/uL (ref 850–3900)
MCH: 30.8 pg (ref 27.0–33.0)
MCHC: 33.8 g/dL (ref 32.0–36.0)
MCV: 91 fL (ref 80.0–100.0)
MPV: 9 fL (ref 7.5–12.5)
Monocytes Relative: 11.8 %
Neutro Abs: 3387 cells/uL (ref 1500–7800)
Neutrophils Relative %: 58.4 %
Platelets: 249 10*3/uL (ref 140–400)
RBC: 5.1 10*6/uL (ref 4.20–5.80)
RDW: 12.4 % (ref 11.0–15.0)
Total Lymphocyte: 26 %
WBC: 5.8 10*3/uL (ref 3.8–10.8)

## 2020-11-26 LAB — HEMOGLOBIN A1C
Hgb A1c MFr Bld: 6.4 % of total Hgb — ABNORMAL HIGH (ref <5.7)
Mean Plasma Glucose: 137 mg/dL
eAG (mmol/L): 7.6 mmol/L

## 2020-11-26 LAB — COMPLETE METABOLIC PANEL WITH GFR
AG Ratio: 1.8 (calc) (ref 1.0–2.5)
ALT: 44 U/L (ref 9–46)
AST: 45 U/L — ABNORMAL HIGH (ref 10–35)
Albumin: 4.8 g/dL (ref 3.6–5.1)
Alkaline phosphatase (APISO): 67 U/L (ref 35–144)
BUN: 11 mg/dL (ref 7–25)
CO2: 28 mmol/L (ref 20–32)
Calcium: 9.6 mg/dL (ref 8.6–10.3)
Chloride: 95 mmol/L — ABNORMAL LOW (ref 98–110)
Creat: 1.15 mg/dL (ref 0.70–1.33)
GFR, Est African American: 82 mL/min/{1.73_m2} (ref >=60)
GFR, Est Non African American: 71 mL/min/{1.73_m2} (ref >=60)
Globulin: 2.6 g/dL (calc) (ref 1.9–3.7)
Glucose, Bld: 94 mg/dL (ref 65–99)
Potassium: 4.6 mmol/L (ref 3.5–5.3)
Sodium: 133 mmol/L — ABNORMAL LOW (ref 135–146)
Total Bilirubin: 0.4 mg/dL (ref 0.2–1.2)
Total Protein: 7.4 g/dL (ref 6.1–8.1)

## 2020-11-26 LAB — TSH: TSH: 2 mIU/L (ref 0.40–4.50)

## 2020-11-26 MED ORDER — ROSUVASTATIN CALCIUM 10 MG PO TABS
ORAL_TABLET | ORAL | 0 refills | Status: DC
Start: 2020-11-26 — End: 2021-08-05

## 2020-12-16 ENCOUNTER — Other Ambulatory Visit: Payer: Self-pay | Admitting: Internal Medicine

## 2020-12-16 DIAGNOSIS — G500 Trigeminal neuralgia: Secondary | ICD-10-CM

## 2020-12-16 MED ORDER — MELOXICAM 15 MG PO TABS: ORAL_TABLET | ORAL | 1 refills | Status: DC

## 2020-12-19 ENCOUNTER — Other Ambulatory Visit: Payer: Self-pay | Admitting: Internal Medicine

## 2020-12-19 MED ORDER — MELOXICAM 15 MG PO TABS
ORAL_TABLET | ORAL | 1 refills | Status: AC
Start: 2020-12-19 — End: ?

## 2020-12-31 ENCOUNTER — Other Ambulatory Visit: Payer: Self-pay

## 2020-12-31 DIAGNOSIS — R7301 Impaired fasting glucose: Secondary | ICD-10-CM

## 2021-01-01 MED ORDER — OZEMPIC (0.25 OR 0.5 MG/DOSE) 2 MG/1.5ML ~~LOC~~ SOPN: PEN_INJECTOR | SUBCUTANEOUS | 1 refills | Status: DC

## 2021-01-01 MED ORDER — DILTIAZEM HCL 120 MG PO TABS: ORAL_TABLET | ORAL | 1 refills | Status: DC

## 2021-02-17 ENCOUNTER — Other Ambulatory Visit (INDEPENDENT_AMBULATORY_CARE_PROVIDER_SITE_OTHER): Payer: BC Managed Care – PPO

## 2021-02-17 ENCOUNTER — Other Ambulatory Visit: Payer: Self-pay

## 2021-02-17 DIAGNOSIS — Z79899 Other long term (current) drug therapy: Secondary | ICD-10-CM | POA: Diagnosis not present

## 2021-02-17 LAB — BASIC METABOLIC PANEL
BUN: 13 mg/dL (ref 6–23)
CO2: 27 mEq/L (ref 19–32)
Calcium: 9.5 mg/dL (ref 8.4–10.5)
Chloride: 99 mEq/L (ref 96–112)
Creatinine, Ser: 1.16 mg/dL (ref 0.40–1.50)
GFR: 70.24 mL/min (ref >60.00)
Glucose, Bld: 101 mg/dL — ABNORMAL HIGH (ref 70–99)
Potassium: 4.1 mEq/L (ref 3.5–5.1)
Sodium: 135 mEq/L (ref 135–145)

## 2021-02-17 NOTE — Progress Notes (Signed)
NEUROLOGY FOLLOW UP OFFICE NOTE  Donald Schwartz [id number]  Assessment/Plan:   Right-sided trigeminal neuralgia  1.  Oxcarbazepine 600mg  twice daily 2.  Check BMP prior to follow up 3.  Follow up 6 months.  Subjective:  Donald Schwartz is a 55 year old right-handed man with hypertension, CKD stage II, hyperlipidemia and OSA whofollows up for right-sided trigeminal neuralgia  UPDATE: Current medication: oxcarbazepine 600mg  BID (900mg  twice daily caused significant hyponatremia)  In December, oxcarbazepine was decreased to 300mg  BID but he began having flareups in January.  Dose increased back to 600mg  BID in February.  Doing well.  02/17/2021 LABS:  Na 135, K 4.1, Cl 99, CO2 27 glucose 101, BUN 13, Cr 1.16, GFR 70.24  HISTORY: Beginning in January 2016, he started having pain involving the right side of the face. It started in the right eye, then involving the right top of the head, right side of nose and right upper teeth. It is a paroxysmal shooting pain lasting a few seconds at a time and occurring several times daily. It is triggered by lightly touching the cheek, eating or feeling of water running over his head. Sometimes there is slight tingling but usually not. Nothing relieves it. It aborts spontaneously. There is no numbness or facial weakness. He was evaluated by the eye doctor and dentist with normal exams.  Prior medications, Lyrica. 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 His insurance would not cover baclofen.  PAST MEDICAL HISTORY: Past Medical History:  Diagnosis Date  . Allergy   . Arthritis   . Elevated hemoglobin A1c   . Elevated LDL cholesterol level   . Hx of colonic polyp 11/20/2014  . Hypertension   . Hypogonadism male   . Neuromuscular disorder (HCC)    Trigeminar neuralgia  . Sleep apnea    wears CPAP  . Vitamin D deficiency     MEDICATIONS: Current Outpatient Medications on File Prior to  Visit  Medication Sig Dispense Refill  . Ascorbic Acid (VITAMIN C) 500 MG CAPS Take 1 capsule by mouth daily.     Marland Kitchen aspirin 81 MG chewable tablet Chew 1 tablet (81 mg total) by mouth daily.    . Cholecalciferol (VITAMIN D PO) Take 10,000 Units by mouth daily.     Marland Kitchen diltiazem (CARDIZEM) 120 MG tablet Take  1 tablet  Daily  for BP 90 tablet 1  . fluticasone (FLONASE) 50 MCG/ACT nasal spray USE ONE SPRAY EACH NOSTRIL TWICE DAILY 16 g 3  . Lecithin 1200 MG CAPS Take by mouth daily.    Marland Kitchen lisinopril (ZESTRIL) 40 MG tablet Take 1 tablet by mouth once daily for blood pressure 90 tablet 1  . Magnesium 250 MG TABS Take 500 mg by mouth 2 (two) times a day.     . meloxicam (MOBIC) 15 MG tablet Take  1/2 to 1 tablet  Daily  with Food  for Pain /Inflammation & try limit to 5 days /week to Avoid Kidney Damage 90 tablet 1  . Multiple Vitamins-Minerals (MULTIVITAMIN MEN PO) Take by mouth daily.    . Omega-3 Fatty Acids (FISH OIL) 1000 MG CAPS Take 1,200 mg by mouth. Take two tablets in the morning and two tablets in the evening    . oxcarbazepine (TRILEPTAL) 600 MG tablet Take 1/2 tablet twice daily 60 tablet 5  . rosuvastatin (CRESTOR) 10 MG tablet TAKE 1 TABLET BY MOUTH ONCE DAILY FOR CHOLESTEROL 90 tablet 0  . Semaglutide,0.25 or 0.5MG /DOS, (OZEMPIC, 0.25  OR 0.5 MG/DOSE,) 2 MG/1.5ML SOPN Inject 0.5 mg  weekly into skin 4.5 mL 1  . sildenafil (VIAGRA) 100 MG tablet 1/2-1 pill as needed 1 hour before intercourse, can get with good RX from [facility] 10 tablet 2  . valACYclovir (VALTREX) 1000 MG tablet Take 1 tablet Daily to Prevent Fever Blisters (Patient taking differently: as needed. Take 1 tablet Daily to Prevent Fever Blisters) 90 tablet 3  . zinc gluconate 50 MG tablet Take 50 mg by mouth daily.     No current facility-administered medications on file prior to visit.    ALLERGIES: Allergies  Allergen Reactions  . Toprol Xl [Metoprolol Tartrate]     ED    FAMILY HISTORY: Family History   Problem Relation Age of Onset  . Colon cancer Maternal Uncle   . Prostate cancer Maternal Uncle   . Colon cancer Paternal Uncle   . Colon cancer Maternal Grandfather   . Colon cancer Paternal Grandfather   . Prostate cancer Father   . Stomach cancer Neg Hx   . Esophageal cancer Neg Hx       Objective:  Blood pressure 115/74, pulse 79, height 6\' 3"  (1.905 m), weight 273 lb 9.6 oz (124.1 kg), SpO2 97 %. General: No acute distress.  Patient appears well-groomed.      Metta Clines, DO  CC: Donald Pinto, MD

## 2021-02-19 ENCOUNTER — Other Ambulatory Visit: Payer: Self-pay | Admitting: Neurology

## 2021-02-19 ENCOUNTER — Encounter: Payer: Self-pay | Admitting: Neurology

## 2021-02-19 ENCOUNTER — Ambulatory Visit: Payer: BC Managed Care – PPO | Admitting: Neurology

## 2021-02-19 ENCOUNTER — Other Ambulatory Visit: Payer: Self-pay

## 2021-02-19 ENCOUNTER — Telehealth: Payer: Self-pay | Admitting: Neurology

## 2021-02-19 VITALS — BP 115/74 | HR 79 | Ht 75.0 in | Wt 273.6 lb

## 2021-02-19 DIAGNOSIS — G500 Trigeminal neuralgia: Secondary | ICD-10-CM

## 2021-02-19 MED ORDER — OXCARBAZEPINE 600 MG PO TABS: 600.0000 mg | ORAL_TABLET | Freq: Two times a day (BID) | ORAL | 5 refills | Status: DC

## 2021-02-19 NOTE — Telephone Encounter (Signed)
Dr.Jaffe advised of call and script resent.

## 2021-02-19 NOTE — Patient Instructions (Signed)
Continue oxcarbazepine 600mg  twice daily Repeat BMP in 6 months Follow up after repeat BMP

## 2021-02-19 NOTE — Telephone Encounter (Signed)
Katrina called wanting explanation on his oxcarbazepine

## 2021-02-24 ENCOUNTER — Telehealth: Payer: Self-pay | Admitting: Neurology

## 2021-02-24 NOTE — Telephone Encounter (Signed)
Donald Schwartz called from pharmacy and would like to know what strength medicine he is suppose to take. The medicine is oxcarbazepine.

## 2021-02-24 NOTE — Telephone Encounter (Signed)
Telephone call to pharmacy.  Direction for Oxcarbzepine given. 600 mg BID

## 2021-03-05 ENCOUNTER — Other Ambulatory Visit: Payer: Self-pay

## 2021-03-05 ENCOUNTER — Encounter: Payer: Self-pay | Admitting: Internal Medicine

## 2021-03-05 ENCOUNTER — Ambulatory Visit (INDEPENDENT_AMBULATORY_CARE_PROVIDER_SITE_OTHER): Payer: BC Managed Care – PPO | Admitting: Internal Medicine

## 2021-03-05 VITALS — BP 120/72 | HR 82 | Temp 97.7°F | Resp 17 | Ht 73.0 in | Wt 274.4 lb

## 2021-03-05 DIAGNOSIS — L03116 Cellulitis of left lower limb: Secondary | ICD-10-CM

## 2021-03-05 MED ORDER — DOXYCYCLINE HYCLATE 100 MG PO CAPS: ORAL_CAPSULE | ORAL | 0 refills | Status: DC

## 2021-03-05 MED ORDER — FUROSEMIDE 40 MG PO TABS: ORAL_TABLET | ORAL | 0 refills | Status: DC

## 2021-03-05 NOTE — Progress Notes (Signed)
Future Appointments  Date Time Provider [place]  06/05/2021 10:00 AM [name], MD [facility] None  08/26/2021  8:50 AM Pieter Partridge, DO [facility] None    History of Present Illness:      Patient presents with concern of skin infection in his lateral Left calf. !st nores a small "bump " which gradually increased in size as it became erythematous . He tried a heating pad and feels that he burned the area as it became more reddened and developed a blister.   Medications  Current Outpatient Medications (Endocrine & Metabolic):    [id number],0.62 or 0.5MG /DOS, (OZEMPIC, 0.25 OR 0.5 MG/DOSE,) 2 MG/1.5ML SOPN, Inject 0.5 mg  weekly into skin  Current Outpatient Medications (Cardiovascular):    diltiazem (CARDIZEM) 120 MG tablet, Take  1 tablet  Daily  for BP   furosemide (LASIX) 40 MG tablet, Take  1 tablet  Daily  for Fluid Retention /Leg Swelling   lisinopril (ZESTRIL) 40 MG tablet, Take 1 tablet by mouth once daily for blood pressure   rosuvastatin (CRESTOR) 10 MG tablet, TAKE 1 TABLET BY MOUTH ONCE DAILY FOR CHOLESTEROL   sildenafil (VIAGRA) 100 MG tablet, 1/2-1 pill as needed 1 hour before intercourse, can get with good RX from [employer]  Current Outpatient Medications (Respiratory):    fluticasone (FLONASE) 50 MCG/ACT nasal spray, USE ONE SPRAY EACH NOSTRIL TWICE DAILY  Current Outpatient Medications (Analgesics):    aspirin 81 MG chewable tablet, Chew 1 tablet (81 mg total) by mouth daily.   meloxicam (MOBIC) 15 MG tablet, Take  1/2 to 1 tablet  Daily  with Food  for Pain /Inflammation & try limit to 5 days /week to Avoid Kidney Damage   Current Outpatient Medications (Other):    Ascorbic Acid (VITAMIN C) 500 MG CAPS, Take 1 capsule by mouth daily.   Cholecalciferol (VITAMIN D PO), Take 10,000 Units by mouth daily.    doxycycline (VIBRAMYCIN) 100 MG capsule, Take  1 capsule  2 x /day  with meals for  skin Infection   Lecithin 1200 MG CAPS, Take by mouth  daily.   Magnesium 250 MG TABS, Take 500 mg by mouth 2 (two) times a day.    Multiple Vitamins-Minerals (MULTIVITAMIN MEN PO), Take by mouth daily.   Omega-3 Fatty Acids (FISH OIL) 1000 MG CAPS, Take 1,200 mg by mouth. Take two tablets in the morning and two tablets in the evening   oxcarbazepine (TRILEPTAL) 600 MG tablet, Take 1 tablet (600 mg total) by mouth 2 (two) times daily.   valACYclovir (VALTREX) 1000 MG tablet, Take 1 tablet Daily to Prevent Fever Blisters (Patient taking differently: as needed. Take 1 tablet Daily to Prevent Fever Blisters)   zinc gluconate 50 MG tablet, Take 50 mg by mouth daily.  Problem list He has Hypertension; Hyperlipidemia; Other abnormal glucose (prediabetes); Vitamin D deficiency; OSA on CPAP; CKD (chronic kidney disease) stage 2, GFR 60-89 ml/min; Hx of colonic polyp + FHx CRCA; Medication management; Trigeminal neuralgia of right side of face; Morbid obesity ([place]) - BMI 30+ with OSA; and Fatty liver on their problem list.   Observations/Objective:  BP 120/72   Pulse 82   Temp 97.7 F (36.5 C)   Resp 17   Ht 6\' 1"  (1.854 m)   Wt 274 lb 6.4 oz (124.5 kg)   SpO2 97%   BMI 36.20 kg/m   HEENT - WNL. Neck - supple.  Chest - Clear equal BS. Cor - Nl HS. RRR  w/o sig MGR. PP 1(+). Mild bilateral  edema. Assymetric to the left.  MS- FROM w/o deformities.  Gait Nl. Neuro -  Nl w/o focal abnormalities. Skin of lateral mid left shin finds a 3 in x 3 in raised erythematous area . No Lymphangitic streaking , vesiculation or ulcerations   Assessment and Plan:  1. Cellulitis of left leg  - doxycycline  100 MG; Take  1 capsule  2 x /day  with meals for  skin Infection  Disp: 60 capsule  - furosemide  40 MG; Take  1 tablet  Daily  for Fluid Retention /Leg Swelling  Disp: 30 tablet   Follow Up Instructions:      I discussed the assessment and treatment plan with the patient. The patient was provided an opportunity to ask questions and all were answered.  The patient agreed with the plan and demonstrated an understanding of the instructions.       The patient was advised to call back or seek an in-person evaluation if the symptoms worsen or if the condition fails to improve as anticipated.   Kirtland Bouchard, MD

## 2021-03-07 ENCOUNTER — Encounter: Payer: Self-pay | Admitting: Internal Medicine

## 2021-04-01 ENCOUNTER — Other Ambulatory Visit: Payer: Self-pay | Admitting: Internal Medicine

## 2021-04-01 DIAGNOSIS — L03116 Cellulitis of left lower limb: Secondary | ICD-10-CM

## 2021-04-01 MED ORDER — FUROSEMIDE 40 MG PO TABS: ORAL_TABLET | ORAL | 1 refills | Status: DC

## 2021-04-01 MED ORDER — DOXYCYCLINE HYCLATE 100 MG PO CAPS: ORAL_CAPSULE | ORAL | 1 refills | Status: DC

## 2021-04-08 ENCOUNTER — Other Ambulatory Visit: Payer: Self-pay | Admitting: Adult Health

## 2021-06-02 ENCOUNTER — Encounter: Payer: Self-pay | Admitting: Internal Medicine

## 2021-06-04 ENCOUNTER — Encounter: Payer: Self-pay | Admitting: Internal Medicine

## 2021-06-04 NOTE — Patient Instructions (Signed)

## 2021-06-04 NOTE — Progress Notes (Signed)
Annual  Screening/Preventative Visit  & Comprehensive Evaluation & Examination  Future Appointments  Date Time Provider [place]  06/05/2021   - CPE  10:00 AM [name], MD [facility] None  08/26/2021  8:50 AM Pieter Partridge, DO [facility] None  06/07/2022   - CPE  10:00 AM [name], MD [facility] None            This very nice 54 y.o. MWM  presents for a Screening /Preventative Visit & comprehensive evaluation and management of multiple medical co-morbidities.  Patient has been followed for HTN, HLD, Prediabetes and Vitamin D Deficiency. Patient has OSA on CPAP with improved Restorative Sleep. Patient is followed by Dr Tomi Likens for chronic Trigeminal Neuralgia.            Patient was treated for a cellulitis of the distal lateral Left Leg above the ankle joint  with Doxycycline and  the indurated tender ST swelling has not resolved.        HTN predates circa 2002. Patient's BP has been controlled at home.  Today's BP is at goal -114/72. Patient denies any cardiac symptoms as chest pain, palpitations, shortness of breath, dizziness or ankle swelling.       Patient's hyperlipidemia is controlled with diet and medications. Patient denies myalgias or other medication SE's. Last lipids were at goal:  Lab Results  Component Value Date   CHOL 105 11/25/2020   HDL 45 11/25/2020   LDLCALC 44 11/25/2020   TRIG 82 11/25/2020   CHOLHDL 2.3 11/25/2020         Patient has Morbid Obesity (BMI 35+) and consequent PreDiabetes  (A1c 6.3% /2008) and patient denies reactive hypoglycemic symptoms, visual blurring, diabetic polys or paresthesias. Last A1c was not at goal:   Lab Results  Component Value Date   HGBA1C 6.4 (H) 11/25/2020         Patient has been treated in the past with parenteral replacementTestosterone and has been on  with improved stamina & sense of well being, but apparently stopped it last year.         Finally, patient has history of Vitamin D Deficiency  ("31" /2008) and last vitamin D was at goal:   Lab Results  Component Value Date   VD25OH 77 05/23/2020      Current Outpatient Medications on File Prior to Visit  Medication Sig   VITAMIN C 500 MG CAPS Take 1 capsule daily.   aspirin 81 MG Chew 1 tablet  daily.   VITAMIN D 10,000 Units   Take daily.    diltiazem 120 MG tablet Take  1 tablet  Daily  for BP   doxycycline 100 MG capsule Take  1 capsule  2 x /day  with meals for  skin Infection   FLONASE   nasal spray USE ONE SPRAY EACH NOSTRIL TWICE DAILY   furosemide 40 MG tablet Take  1 tablet  Daily  for Fluid Retention    Lecithin 1200 MG CAPS Take by mouth daily.   lisinopril  40 MG tablet Take 1 tablet once daily for blood pressure   Magnesium 250 MG TABS Take 500 mg times a day.    meloxicam (15 MG tablet Take  1/2 to 1 tablet  Daily  with Food    Multiple Vitamins-Minerals  Take  daily.   Omega-3  FISH OIL 1000 MG  Take two tablets 2 x /day    oxcarbazepine (TRILEPTAL) 600 MG Take 1 tablet 2  times  daily.   rosuvastatin  10 MG tablet TAKE 1 TABLET  ONCE DAILY    Semaglutide,0.25 or 0.'5MG'$ /DOS 2 MG/1.5ML  Inject 0.5 mg  weekly into skin   sildenafil (VIAGRA) 100 MG tablet 1/2-1 pill as needed 1 hour before XXXX   valACYclovir (VALTREX) 1000 MG tablet Take 1 tablet Daily to Prevent Fever Blisters   zinc 50 MG tablet Take 50 mg by mouth daily.    Allergies  Allergen Reactions   Toprol Xl [Metoprolol Tartrate]     ED     Past Medical History:  Diagnosis Date   Allergy    Arthritis    Elevated hemoglobin A1c    Elevated LDL cholesterol level    Hx of colonic polyp 11/20/2014   Hypertension    Hypogonadism male    Neuromuscular disorder (HCC)    Trigeminar neuralgia   Sleep apnea    wears CPAP   Vitamin D deficiency      Health Maintenance  Topic Date Due   Pneumococcal Vaccine 32-57 Years old (2 - PCV) 09/20/2010   COVID-19 Vaccine (3 - Pfizer risk series) 02/25/2020   COLONOSCOPY (Pts 45-8yr Insurance  coverage will need to be confirmed)  01/30/2021   INFLUENZA VACCINE  04/20/2021   Zoster Vaccines- Shingrix (2 of 2) 07/23/2021   TETANUS/TDAP  02/02/2026   Hepatitis C Screening  Completed   HIV Screening  Completed   HPV VACCINES  Aged Out     Immunization History  Administered Date(s) Administered   Influenza Inj Mdck Quad  06/02/2017, 06/28/2018, 07/23/2019   Influenza  07/10/2014   Influenza 07/19/2016   PFIZER SARS-COV-2 Vacc 01/03/2020, 01/28/2020   PPD Test 04/18/2019, 05/23/2020   Pneumococcal - 23 09/20/2009   Td 09/20/2005   Tdap 02/03/2016   Last Colon - 01/30/2018 - Dr GCarlean Purl- polyps - Recc 3 yr f/u due May 2022  Past Surgical History:  Procedure Laterality Date   c5-6 fusion     2012   COLONOSCOPY     [facility] [facility]   avulsion/chip fracture/ stress fracture     Family History  Problem Relation Age of Onset   Colon cancer Maternal Uncle    Prostate cancer Maternal Uncle    Colon cancer Paternal Uncle    Colon cancer Maternal Grandfather    Colon cancer Paternal Grandfather    Prostate cancer Father    Stomach cancer Neg Hx    Esophageal cancer Neg Hx     Social History   Socioeconomic History   Marital status: Married    Spouse name: Not on file   Number of children: 0   Highest education level: Some college,  Occupational History    Employer: [employer] Tobacco Use   Smoking status: Former    Types: Cigarettes    Quit date: 09/20/1989    Years since quitting: 31.7   Smokeless tobacco: Never  Vaping Use   Vaping Use: Never used  Substance and Sexual Activity   Alcohol use: No    Alcohol/week: 0.0 standard drinks   Drug use: No   Sexual activity: Yes    Partners: Female     ROS Constitutional: Denies fever, chills, weight loss/gain, headaches, insomnia,  night sweats or change in appetite. Does c/o fatigue. Eyes: Denies redness, blurred vision, diplopia, discharge, itchy or watery eyes.  ENT: Denies  discharge, congestion, post nasal drip, epistaxis, sore throat, earache, hearing loss, dental pain, Tinnitus, Vertigo, Sinus pain or snoring.  Cardio: Denies chest pain, palpitations, irregular heartbeat, syncope, dyspnea, diaphoresis, orthopnea, PND, claudication or edema Respiratory: denies cough, dyspnea, DOE, pleurisy, hoarseness, laryngitis or wheezing.  Gastrointestinal: Denies dysphagia, heartburn, reflux, water brash, pain, cramps, nausea, vomiting, bloating, diarrhea, constipation, hematemesis, melena, hematochezia, jaundice or hemorrhoids Genitourinary: Denies dysuria, frequency, urgency, nocturia, hesitancy, discharge, hematuria or flank pain Musculoskeletal: Denies arthralgia, myalgia, stiffness, Jt. Swelling, pain, limp or strain/sprain. Denies Falls. Skin: Denies puritis, rash, hives, warts, acne, eczema or change in skin lesion Neuro: No weakness, tremor, incoordination, spasms, paresthesia or pain Psychiatric: Denies confusion, memory loss or sensory loss. Denies Depression. Endocrine: Denies change in weight, skin, hair change, nocturia, and paresthesia, diabetic polys, visual blurring or hyper / hypo glycemic episodes.  Heme/Lymph: No excessive bleeding, bruising or enlarged lymph nodes.   Physical Exam  BP 114/72   Pulse 75   Temp (!) 97.5 F (36.4 C)   Resp 16   Ht '6\' 1"'$  (1.854 m)   Wt 269 lb 3.2 oz (122.1 kg)   SpO2 97%   BMI 35.52 kg/m   General Appearance: Well nourished and well groomed and in no apparent distress.  Eyes: PERRLA, EOMs, conjunctiva no swelling or erythema, normal fundi and vessels. Sinuses: No frontal/maxillary tenderness ENT/Mouth: EACs patent / TMs  nl. Nares clear without erythema, swelling, mucoid exudates. Oral hygiene is good. No erythema, swelling, or exudate. Tongue normal, non-obstructing. Tonsils not swollen or erythematous. Hearing normal.  Neck: Supple, thyroid not palpable. No bruits, nodes or JVD. Respiratory: Respiratory effort  normal.  BS equal and clear bilateral without rales, rhonci, wheezing or stridor. Cardio: Heart sounds are normal with regular rate and rhythm and no murmurs, rubs or gallops. Peripheral pulses are normal and equal bilaterally without edema. No aortic or femoral bruits. Chest: symmetric with normal excursions and percussion.  Abdomen: Soft, with Nl bowel sounds. Nontender, no guarding, rebound, hernias, masses, or organomegaly.  Lymphatics: Non tender without lymphadenopathy.  Musculoskeletal: Full ROM all peripheral extremities, joint stability, 5/5 strength, and normal gait. Skin: Warm and dry without rashes, cyanosis, clubbing or  ecchymosis.                         There is an area of tender erythematous palpable subcutaneous soft tissue swelling or thickening which cannot be determined if confluent / contiguous with a distal muscle or tendon structure.  No lymphangitic streaking.   Neuro: Cranial nerves intact, reflexes equal bilaterally. Normal muscle tone, no cerebellar symptoms. Sensation intact.  Pysch: Alert and oriented X 3 with normal affect, insight and judgment appropriate.   Assessment and Plan  1. Annual Preventative/Screening Exam   2. Essential hypertension  - EKG 12-Lead - [place], RETROPERITNL ABD,  LTD - Urinalysis, Routine w reflex microscopic - Microalbumin / creatinine urine ratio - CBC with Differential/Platelet - COMPLETE METABOLIC PANEL WITH GFR - Magnesium - TSH  3. Hyperlipidemia, mixed  - EKG 12-Lead - [place], RETROPERITNL ABD,  LTD - Lipid panel - TSH  4. Abnormal glucose  - EKG 12-Lead - [place], RETROPERITNL ABD,  LTD - Hemoglobin A1c - Insulin, random  5. Vitamin D deficiency  - VITAMIN D 25 Hydroxy   6. Testosterone Deficiency  - Testosterone  7. Trigeminal neuralgia of right side of face   8. CKD (chronic kidney disease) stage 2, GFR 60-89 ml/min  - Urinalysis, Routine w reflex microscopic - Microalbumin / creatinine urine ratio -  COMPLETE METABOLIC PANEL WITH GFR  9. PreDiabetes  -  EKG 12-Lead - [place], RETROPERITNL ABD,  LTD - Hemoglobin A1c - Insulin, random  10. BMI 37.0-37.9, adult  - TSH  11. Prostate cancer screening  - PSA  12. Screening-pulmonary TB  - TB Skin Test  13. Screening for colorectal cancer  - POC Hemoccult Bld/Stl   14. Screening for ischemic heart disease  - EKG 12-Lead  15. FH: hypertension  - EKG 12-Lead - [place], RETROPERITNL ABD,  LTD  16. Former smoker  - EKG 12-Lead - [place], RETROPERITNL ABD,  LTD  17. Screening for AAA (aortic abdominal aneurysm)  - [place], RETROPERITNL ABD,  LTD - Iron, Total/Total Iron Binding Cap - Vitamin B12  18. Fatigue, unspecified type  - CBC with Differential/Platelet - TSH  19. Medication management  - Urinalysis, Routine w reflex microscopic - Microalbumin / creatinine urine ratio - CBC with Differential/Platelet - COMPLETE METABOLIC PANEL WITH GFR - Magnesium - Lipid panel - TSH - Hemoglobin A1c - Insulin, random - VITAMIN D 25 Hydroxy   20. Chronic pain of left lower extremity  - cephALEXin (KEFLEX) 500 MG capsule; Take 1 capsule 3 x /day after meals for Infection   Dispense: 100 capsule  - [place] Img Lower Unilateral Left (DVT); Future          Patient was counseled in prudent diet, weight control to achieve/maintain BMI less than 25, BP monitoring, regular exercise and medications as discussed.  Discussed med effects and SE's. Routine screening labs and tests as requested with regular follow-up as recommended. Over 40 minutes of exam, counseling, chart review and high complex critical decision making was performed   Kirtland Bouchard, MD

## 2021-06-05 ENCOUNTER — Other Ambulatory Visit: Payer: Self-pay

## 2021-06-05 ENCOUNTER — Ambulatory Visit (INDEPENDENT_AMBULATORY_CARE_PROVIDER_SITE_OTHER): Payer: BC Managed Care – PPO | Admitting: Internal Medicine

## 2021-06-05 VITALS — BP 114/72 | HR 75 | Temp 97.5°F | Resp 16 | Ht 73.0 in | Wt 269.2 lb

## 2021-06-05 DIAGNOSIS — Z125 Encounter for screening for malignant neoplasm of prostate: Secondary | ICD-10-CM | POA: Diagnosis not present

## 2021-06-05 DIAGNOSIS — Z131 Encounter for screening for diabetes mellitus: Secondary | ICD-10-CM | POA: Diagnosis not present

## 2021-06-05 DIAGNOSIS — M79605 Pain in left leg: Secondary | ICD-10-CM

## 2021-06-05 DIAGNOSIS — Z1329 Encounter for screening for other suspected endocrine disorder: Secondary | ICD-10-CM | POA: Diagnosis not present

## 2021-06-05 DIAGNOSIS — Z130 Encounter for screening for diseases of the blood and blood-forming organs and certain disorders involving the immune mechanism: Secondary | ICD-10-CM | POA: Diagnosis not present

## 2021-06-05 DIAGNOSIS — G8929 Other chronic pain: Secondary | ICD-10-CM

## 2021-06-05 DIAGNOSIS — G500 Trigeminal neuralgia: Secondary | ICD-10-CM

## 2021-06-05 DIAGNOSIS — Z1389 Encounter for screening for other disorder: Secondary | ICD-10-CM | POA: Diagnosis not present

## 2021-06-05 DIAGNOSIS — E291 Testicular hypofunction: Secondary | ICD-10-CM

## 2021-06-05 DIAGNOSIS — Z1211 Encounter for screening for malignant neoplasm of colon: Secondary | ICD-10-CM

## 2021-06-05 DIAGNOSIS — E782 Mixed hyperlipidemia: Secondary | ICD-10-CM

## 2021-06-05 DIAGNOSIS — Z6837 Body mass index (BMI) 37.0-37.9, adult: Secondary | ICD-10-CM

## 2021-06-05 DIAGNOSIS — I10 Essential (primary) hypertension: Secondary | ICD-10-CM | POA: Diagnosis not present

## 2021-06-05 DIAGNOSIS — Z79899 Other long term (current) drug therapy: Secondary | ICD-10-CM

## 2021-06-05 DIAGNOSIS — Z13220 Encounter for screening for lipoid disorders: Secondary | ICD-10-CM

## 2021-06-05 DIAGNOSIS — R7309 Other abnormal glucose: Secondary | ICD-10-CM

## 2021-06-05 DIAGNOSIS — Z136 Encounter for screening for cardiovascular disorders: Secondary | ICD-10-CM

## 2021-06-05 DIAGNOSIS — Z1212 Encounter for screening for malignant neoplasm of rectum: Secondary | ICD-10-CM

## 2021-06-05 DIAGNOSIS — Z87891 Personal history of nicotine dependence: Secondary | ICD-10-CM

## 2021-06-05 DIAGNOSIS — N182 Chronic kidney disease, stage 2 (mild): Secondary | ICD-10-CM

## 2021-06-05 DIAGNOSIS — R5383 Other fatigue: Secondary | ICD-10-CM

## 2021-06-05 DIAGNOSIS — Z111 Encounter for screening for respiratory tuberculosis: Secondary | ICD-10-CM

## 2021-06-05 DIAGNOSIS — Z0000 Encounter for general adult medical examination without abnormal findings: Secondary | ICD-10-CM | POA: Diagnosis not present

## 2021-06-05 DIAGNOSIS — N401 Enlarged prostate with lower urinary tract symptoms: Secondary | ICD-10-CM

## 2021-06-05 DIAGNOSIS — E559 Vitamin D deficiency, unspecified: Secondary | ICD-10-CM

## 2021-06-05 DIAGNOSIS — Z0001 Encounter for general adult medical examination with abnormal findings: Secondary | ICD-10-CM

## 2021-06-05 DIAGNOSIS — R350 Frequency of micturition: Secondary | ICD-10-CM | POA: Diagnosis not present

## 2021-06-05 DIAGNOSIS — Z8249 Family history of ischemic heart disease and other diseases of the circulatory system: Secondary | ICD-10-CM

## 2021-06-05 MED ORDER — CEPHALEXIN 500 MG PO CAPS: ORAL_CAPSULE | ORAL | 0 refills | Status: DC

## 2021-06-06 NOTE — Progress Notes (Signed)
============================================================ -   Test results slightly outside the reference range are not unusual. If there is anything important, I will review this with you,  otherwise it is considered normal test values.  If you have further questions,  please do not hesitate to contact me at the office or via My Chart.  ============================================================ ============================================================  -  Iron Levels are Low, So . . .  Recommend  - take an OTC iron supplement and   - Eat more Veggies with Iron as Carrots, Beets , all leafy green veggies as   Spinach, Collards,  Turnip - Mustard or Mixed Greens, Kale, Asparagus,   Broccoli,        Brussel Sprouts,                  Green Beans / peas,                                         Soybeans,                                                     Lentils and                                                                    Sweet Potatoes  ============================================================ ============================================================  -  Vitamin B12 level is Normal &  OK  ============================================================ ============================================================  -  PSA - Low - Great  ============================================================ ============================================================  -  Testosterone Level is Normal  ============================================================ ============================================================  -  Total Chol = 106   &   LDL Chopl = 44    - Both  Excellent   - Very low risk for Heart Attack  / Stroke ============================================================ ============================================================  -  A1c - Much Better  !     - Down from 6.4% to now 5.7%   - almost back in normal nonDiabetic range - Excellent  ! ============================================================ ============================================================  -  Vitamin D = 92 - Excellent !  ============================================================ ============================================================  -  All Else - CBC - Kidneys - Electrolytes - Liver - Magnesium & Thyroid    - all  Normal / OK ============================================================ ============================================================  -  Keep up the [place] Work  !   ============================================================ ============================================================

## 2021-06-08 ENCOUNTER — Encounter: Payer: Self-pay | Admitting: Internal Medicine

## 2021-06-08 ENCOUNTER — Other Ambulatory Visit: Payer: Self-pay | Admitting: Internal Medicine

## 2021-06-08 DIAGNOSIS — M7989 Other specified soft tissue disorders: Secondary | ICD-10-CM

## 2021-06-08 DIAGNOSIS — R2242 Localized swelling, mass and lump, left lower limb: Secondary | ICD-10-CM

## 2021-06-08 LAB — URINALYSIS, ROUTINE W REFLEX MICROSCOPIC
Bilirubin Urine: NEGATIVE
Glucose, UA: NEGATIVE
Hgb urine dipstick: NEGATIVE
Ketones, ur: NEGATIVE
Leukocytes,Ua: NEGATIVE
Nitrite: NEGATIVE
Protein, ur: NEGATIVE
Specific Gravity, Urine: 1.006 (ref 1.001–1.035)
pH: 6.5 (ref 5.0–8.0)

## 2021-06-08 LAB — COMPLETE METABOLIC PANEL WITH GFR
AG Ratio: 1.6 (calc) (ref 1.0–2.5)
ALT: 25 U/L (ref 9–46)
AST: 30 U/L (ref 10–35)
Albumin: 4.5 g/dL (ref 3.6–5.1)
Alkaline phosphatase (APISO): 82 U/L (ref 35–144)
BUN: 13 mg/dL (ref 7–25)
CO2: 27 mmol/L (ref 20–32)
Calcium: 9.4 mg/dL (ref 8.6–10.3)
Chloride: 101 mmol/L (ref 98–110)
Creat: 1.16 mg/dL (ref 0.70–1.30)
Globulin: 2.8 g/dL (calc) (ref 1.9–3.7)
Glucose, Bld: 110 mg/dL — ABNORMAL HIGH (ref 65–99)
Potassium: 4.5 mmol/L (ref 3.5–5.3)
Sodium: 136 mmol/L (ref 135–146)
Total Bilirubin: 0.4 mg/dL (ref 0.2–1.2)
Total Protein: 7.3 g/dL (ref 6.1–8.1)
eGFR: 73 mL/min/{1.73_m2} (ref >=60)

## 2021-06-08 LAB — CBC WITH DIFFERENTIAL/PLATELET
Absolute Monocytes: 749 cells/uL (ref 200–950)
Basophils Absolute: 41 cells/uL (ref 0–200)
Basophils Relative: 0.7 %
Eosinophils Absolute: 289 cells/uL (ref 15–500)
Eosinophils Relative: 4.9 %
HCT: 42.8 % (ref 38.5–50.0)
Hemoglobin: 14 g/dL (ref 13.2–17.1)
Lymphs Abs: 1676 cells/uL (ref 850–3900)
MCH: 29.4 pg (ref 27.0–33.0)
MCHC: 32.7 g/dL (ref 32.0–36.0)
MCV: 89.7 fL (ref 80.0–100.0)
MPV: 8.9 fL (ref 7.5–12.5)
Monocytes Relative: 12.7 %
Neutro Abs: 3145 cells/uL (ref 1500–7800)
Neutrophils Relative %: 53.3 %
Platelets: 330 10*3/uL (ref 140–400)
RBC: 4.77 10*6/uL (ref 4.20–5.80)
RDW: 13.3 % (ref 11.0–15.0)
Total Lymphocyte: 28.4 %
WBC: 5.9 10*3/uL (ref 3.8–10.8)

## 2021-06-08 LAB — LIPID PANEL
Cholesterol: 106 mg/dL (ref <200)
HDL: 47 mg/dL (ref >=40)
LDL Cholesterol (Calc): 44 mg/dL (calc)
Non-HDL Cholesterol (Calc): 59 mg/dL (calc) (ref <130)
Total CHOL/HDL Ratio: 2.3 (calc) (ref <5.0)
Triglycerides: 72 mg/dL (ref <150)

## 2021-06-08 LAB — MICROALBUMIN / CREATININE URINE RATIO
Creatinine, Urine: 22 mg/dL (ref 20–320)
Microalb, Ur: <0.2 mg/dL

## 2021-06-08 LAB — TSH: TSH: 2.16 mIU/L (ref 0.40–4.50)

## 2021-06-08 LAB — MAGNESIUM: Magnesium: 2.3 mg/dL (ref 1.5–2.5)

## 2021-06-08 LAB — IRON, TOTAL/TOTAL IRON BINDING CAP
%SAT: 9 % (calc) — ABNORMAL LOW (ref 20–48)
Iron: 34 ug/dL — ABNORMAL LOW (ref 50–180)
TIBC: 364 mcg/dL (calc) (ref 250–425)

## 2021-06-08 LAB — PSA: PSA: 1.07 ng/mL (ref <=4.00)

## 2021-06-08 LAB — TESTOSTERONE: Testosterone: 583 ng/dL (ref 250–827)

## 2021-06-08 LAB — HEMOGLOBIN A1C
Hgb A1c MFr Bld: 5.7 % of total Hgb — ABNORMAL HIGH (ref <5.7)
Mean Plasma Glucose: 117 mg/dL
eAG (mmol/L): 6.5 mmol/L

## 2021-06-08 LAB — VITAMIN D 25 HYDROXY (VIT D DEFICIENCY, FRACTURES): Vit D, 25-Hydroxy: 92 ng/mL (ref 30–100)

## 2021-06-08 LAB — INSULIN, RANDOM: Insulin: 58.7 u[IU]/mL — ABNORMAL HIGH

## 2021-06-08 LAB — VITAMIN B12: Vitamin B-12: 659 pg/mL (ref 200–1100)

## 2021-06-09 ENCOUNTER — Ambulatory Visit
Admission: RE | Admit: 2021-06-09 | Discharge: 2021-06-09 | Disposition: A | Discharge status: Home / Self Care | Payer: BC Managed Care – PPO | Source: Ambulatory Visit | Attending: Internal Medicine | Admitting: Internal Medicine

## 2021-06-09 DIAGNOSIS — M7989 Other specified soft tissue disorders: Secondary | ICD-10-CM

## 2021-06-09 DIAGNOSIS — R2242 Localized swelling, mass and lump, left lower limb: Secondary | ICD-10-CM

## 2021-06-10 ENCOUNTER — Other Ambulatory Visit: Payer: Self-pay | Admitting: Internal Medicine

## 2021-06-10 DIAGNOSIS — G8929 Other chronic pain: Secondary | ICD-10-CM

## 2021-06-10 DIAGNOSIS — M79605 Pain in left leg: Secondary | ICD-10-CM

## 2021-06-10 NOTE — Progress Notes (Signed)
============================================================ ============================================================  -    Ultrasound of Left distal leg/ankle area unrevealing, So  . . .   - Requesting referral consult with Dr Rhona Raider whom you've seen before.  ============================================================ ============================================================

## 2021-06-30 ENCOUNTER — Encounter: Payer: Self-pay | Admitting: Internal Medicine

## 2021-06-30 ENCOUNTER — Other Ambulatory Visit: Payer: Self-pay

## 2021-06-30 ENCOUNTER — Ambulatory Visit: Payer: BC Managed Care – PPO | Admitting: Internal Medicine

## 2021-06-30 VITALS — BP 118/78 | HR 101 | Temp 98.2°F | Resp 16 | Ht 73.0 in | Wt 275.2 lb

## 2021-06-30 DIAGNOSIS — L03116 Cellulitis of left lower limb: Secondary | ICD-10-CM | POA: Diagnosis not present

## 2021-06-30 DIAGNOSIS — R600 Localized edema: Secondary | ICD-10-CM | POA: Diagnosis not present

## 2021-06-30 MED ORDER — DOXYCYCLINE HYCLATE 100 MG PO CAPS: ORAL_CAPSULE | ORAL | 0 refills | Status: DC

## 2021-06-30 MED ORDER — TORSEMIDE 20 MG PO TABS: ORAL_TABLET | ORAL | 1 refills | Status: DC

## 2021-06-30 NOTE — Progress Notes (Signed)
[facility] ankle    Future Appointments  Date Time Provider [place]  06/30/2021  4:00 PM [name], MD [facility] None  07/31/2021  9:30 AM Gatha Mayer, MD [facility]  08/26/2021  8:50 AM Pieter Partridge, DO [facility] None  06/07/2022 10:00 AM [name], MD [facility] None    History of Present Illness:              This very nice 55 y.o. MWM  with HTN, HLD, Prediabetes and Vitamin D Deficiency returning for 1 month f/u tx/o cellulitis of the distal Lt calf/ankle. U/S was negative for DVT. He reports the tense STS has markedly improved, but he persists with trace ankle swelling bilateral. .    Medications  Current Outpatient Medications (Endocrine & Metabolic):    GXQJJHERDEY,8.14 or 0.5MG /DOS, (OZEMPIC, 0.25 OR 0.5 MG/DOSE,) 2 MG/1.5ML SOPN, Inject 0.5 mg  weekly into skin  Current Outpatient Medications (Cardiovascular):    diltiazem (CARDIZEM) 120 MG tablet, Take  1 tablet  Daily  for BP   furosemide (LASIX) 40 MG tablet, Take  1 tablet  Daily  for Fluid Retention /Leg Swelling   lisinopril (ZESTRIL) 40 MG tablet, Take 1 tablet by mouth once daily for blood pressure   rosuvastatin (CRESTOR) 10 MG tablet, TAKE 1 TABLET BY MOUTH ONCE DAILY FOR CHOLESTEROL   sildenafil (VIAGRA) 100 MG tablet, 1/2-1 pill as needed 1 hour before intercourse, can get with good RX from [employer]  Current Outpatient Medications (Respiratory):    fluticasone (FLONASE) 50 MCG/ACT nasal spray, USE ONE SPRAY EACH NOSTRIL TWICE DAILY  Current Outpatient Medications (Analgesics):    aspirin 81 MG chewable tablet, Chew 1 tablet (81 mg total) by mouth daily.   meloxicam (MOBIC) 15 MG tablet, Take  1/2 to 1 tablet  Daily  with Food  for Pain /Inflammation & try limit to 5 days /week to Avoid Kidney Damage   Current Outpatient Medications (Other):    Ascorbic Acid (VITAMIN C) 500 MG CAPS, Take 1 capsule by mouth daily.   cephALEXin (KEFLEX) 500 MG capsule, Take 1 capsule 3 x /day  after meals for Infection   Cholecalciferol (VITAMIN D PO), Take 10,000 Units by mouth daily.    Lecithin 1200 MG CAPS, Take by mouth daily.   Magnesium 250 MG TABS, Take 500 mg by mouth 2 (two) times a day.    Multiple Vitamins-Minerals (MULTIVITAMIN MEN PO), Take by mouth daily.   Omega-3 Fatty Acids (FISH OIL) 1000 MG CAPS, Take 1,200 mg by mouth. Take two tablets in the morning and two tablets in the evening   oxcarbazepine (TRILEPTAL) 600 MG tablet, Take 1 tablet (600 mg total) by mouth 2 (two) times daily.   valACYclovir (VALTREX) 1000 MG tablet, Take 1 tablet Daily to Prevent Fever Blisters (Patient taking differently: as needed. Take 1 tablet Daily to Prevent Fever Blisters)   zinc gluconate 50 MG tablet, Take 50 mg by mouth daily.  Problem list He has Hypertension; Hyperlipidemia; Other abnormal glucose (prediabetes); Vitamin D deficiency; OSA on CPAP; CKD (chronic kidney disease) stage 2, GFR 60-89 ml/min; Hx of colonic polyp + FHx CRCA; Medication management; Trigeminal neuralgia of right side of face; Morbid obesity ([place]) - BMI 30+ with OSA; and Fatty liver on their problem list.   Observations/Objective:  BP 118/78   Pulse (!) 101   Temp 98.2 F (36.8 C)   Resp 16   Ht 6\' 1"  (1.854 m)   Wt 275 lb 3.2 oz (124.8  kg)   SpO2 96%   BMI 36.31 kg/m   HEENT - WNL. Neck - supple.  Chest - Clear equal BS. Cor - Nl HS. RRR w/o sig MGR. PP 1(+). No edema. MS- FROM w/o deformities.  Gait Nl. Skin -  distal  Left leg/ankle still has an area of tenderness.  Neuro -  Nl w/o focal abnormalities.   Assessment and Plan:  1. Cellulitis of left ankle  - doxycycline 100 MG capsule; Take 1 capsule 2 x /day with meals for Infection   Dispense: 60 capsule; Refill: 0  2. Edema, lower extremity  - Replace Lasix with   - torsemide  20 MG tablet;  Take  1 tablet  1  to 2 x /day for Edema   Dispense: 180 tablet; Refill: 1   Follow Up Instructions:        I discussed the  assessment and treatment plan with the patient. The patient was provided an opportunity to ask questions and all were answered. The patient agreed with the plan and demonstrated an understanding of the instructions.       The patient was advised to call back or seek an in-person evaluation if the symptoms worsen or if the condition fails to improve as anticipated.    Kirtland Bouchard, MD

## 2021-07-08 ENCOUNTER — Telehealth: Payer: Self-pay | Admitting: *Deleted

## 2021-07-08 NOTE — Telephone Encounter (Signed)
Called patient-no answer, left message for the patient to stop diet pill-Phentermine on 10/31-10 days prior to procedure.

## 2021-07-10 ENCOUNTER — Other Ambulatory Visit: Payer: Self-pay | Admitting: Adult Health

## 2021-07-10 ENCOUNTER — Other Ambulatory Visit: Payer: Self-pay | Admitting: Internal Medicine

## 2021-07-22 ENCOUNTER — Ambulatory Visit (AMBULATORY_SURGERY_CENTER): Payer: BC Managed Care – PPO | Admitting: *Deleted

## 2021-07-22 ENCOUNTER — Other Ambulatory Visit: Payer: Self-pay

## 2021-07-22 VITALS — Ht 73.0 in | Wt 262.0 lb

## 2021-07-22 DIAGNOSIS — Z86010 Personal history of colonic polyps: Secondary | ICD-10-CM

## 2021-07-22 DIAGNOSIS — Z800 Family history of malignant neoplasm of digestive organs: Secondary | ICD-10-CM

## 2021-07-22 NOTE — Progress Notes (Signed)
Patient's pre-visit was done today over the phone with the patient due to COVID-19 pandemic. Patient's wife came to our office and explain the pt was having car trouble. Name,DOB and address verified. Patient denies any allergies to Eggs and Soy. Patient denies any problems with anesthesia/sedation. Patient is not taking any diet pills or blood thinners. No home Oxygen. Packet of Prep instructions a copy of a consent form given to pt's wife-pt is aware. Patient understands to call us back with any questions or concerns. Patient is aware of our care-partner policy and [id number] safety protocol.   EMMI education assigned to the patient for the procedure, sent to [facility].   The patient is COVID-19 vaccinated.

## 2021-07-23 ENCOUNTER — Encounter: Payer: Self-pay | Admitting: Internal Medicine

## 2021-07-29 ENCOUNTER — Other Ambulatory Visit: Payer: Self-pay | Admitting: Internal Medicine

## 2021-07-29 DIAGNOSIS — R7301 Impaired fasting glucose: Secondary | ICD-10-CM

## 2021-07-31 ENCOUNTER — Other Ambulatory Visit: Payer: Self-pay

## 2021-07-31 ENCOUNTER — Encounter: Payer: Self-pay | Admitting: Internal Medicine

## 2021-07-31 ENCOUNTER — Ambulatory Visit (AMBULATORY_SURGERY_CENTER): Payer: BC Managed Care – PPO | Admitting: Internal Medicine

## 2021-07-31 VITALS — BP 111/70 | HR 63 | Temp 98.4°F | Resp 10 | Ht 73.0 in | Wt 262.0 lb

## 2021-07-31 DIAGNOSIS — Z86010 Personal history of colonic polyps: Secondary | ICD-10-CM | POA: Diagnosis present

## 2021-07-31 DIAGNOSIS — D122 Benign neoplasm of ascending colon: Secondary | ICD-10-CM

## 2021-07-31 DIAGNOSIS — D128 Benign neoplasm of rectum: Secondary | ICD-10-CM | POA: Diagnosis not present

## 2021-07-31 DIAGNOSIS — Z800 Family history of malignant neoplasm of digestive organs: Secondary | ICD-10-CM

## 2021-07-31 DIAGNOSIS — D125 Benign neoplasm of sigmoid colon: Secondary | ICD-10-CM

## 2021-07-31 MED ORDER — SODIUM CHLORIDE 0.9 % IV SOLN: 500.0000 mL | Freq: Once | INTRAVENOUS | Status: DC

## 2021-07-31 NOTE — Progress Notes (Signed)
[facility] History and Physical   Primary Care Physician:  [name], MD   Reason for Procedure:   Hx polyps and family hx colon cancer  Plan:    colonoscopy     HPI: Donald Schwartz is a 55 y.o. male here for surveillance colonoscopy  Last exam 2019 w/ traditional ssa, ssp anmd tubular adenoma  FHx 2 grandfathers and 2 uncles w/ CRCA   Past Medical History:  Diagnosis Date   Allergy    Arthritis    Elevated hemoglobin A1c    Elevated LDL cholesterol level    Hx of colonic polyp 11/20/2014   Hyperlipidemia    Hypertension    Hypogonadism male    Neuromuscular disorder (HCC)    Trigeminar neuralgia   Sleep apnea    wears CPAP   Vitamin D deficiency     Past Surgical History:  Procedure Laterality Date   c5-6 fusion     2012   COLONOSCOPY  01/30/2018   Dr.Lean Jaeger   HIP FRACTURE SURGERY Right 1986   avulsion/chip fracture ?stress fracture   POLYPECTOMY      Prior to Admission medications   Medication Sig Start Date End Date Taking? Authorizing Provider  Ascorbic Acid (VITAMIN C) 500 MG CAPS Take 1 capsule by mouth daily.   Yes [provider]  aspirin 81 MG chewable tablet Chew 1 tablet (81 mg total) by mouth daily. 03/20/18  Yes [name], MD  Cholecalciferol (VITAMIN D PO) Take 10,000 Units by mouth daily.    Yes [provider]  diltiazem (CARDIZEM) 120 MG tablet Take 1 tablet by mouth once daily for blood pressure 07/10/21  Yes Corbett, Caryl Pina, NP  fluticasone (FLONASE) 50 MCG/ACT nasal spray USE ONE SPRAY EACH NOSTRIL TWICE DAILY 09/23/13  Yes Vladimir Crofts, PA-C  Lecithin 1200 MG CAPS Take by mouth daily.   Yes [provider]  lisinopril (ZESTRIL) 40 MG tablet Take 1 tablet by mouth once daily for blood pressure 07/10/21  Yes Liane Comber, NP  Magnesium 250 MG TABS Take 500 mg by mouth 2 (two) times a day.    Yes [provider]  Multiple Vitamins-Minerals (MULTIVITAMIN MEN PO) Take by  mouth daily.   Yes [provider]  Omega-3 Fatty Acids (FISH OIL) 1000 MG CAPS Take 1,200 mg by mouth. Take two tablets in the morning and two tablets in the evening   Yes [provider]  oxcarbazepine (TRILEPTAL) 600 MG tablet Take 1 tablet (600 mg total) by mouth 2 (two) times daily. 02/19/21  Yes Jaffe, Adam R, DO  rosuvastatin (CRESTOR) 10 MG tablet TAKE 1 TABLET BY MOUTH ONCE DAILY FOR CHOLESTEROL 11/26/20  Yes Liane Comber, NP  torsemide (DEMADEX) 20 MG tablet Take  1 tablet  1  to 2 x /day for Edema 06/30/21  Yes [name], MD  zinc gluconate 50 MG tablet Take 50 mg by mouth daily.   Yes [provider]  doxycycline (VIBRAMYCIN) 100 MG capsule Take 1 capsule 2 x /day with meals for Infection 06/30/21   [name], MD  meloxicam (MOBIC) 15 MG tablet Take  1/2 to 1 tablet  Daily  with Food  for Pain /Inflammation & try limit to 5 days /week to Avoid Kidney Damage 12/19/20   [name], MD  Semaglutide,0.25 or 0.5MG /DOS, (OZEMPIC, 0.25 OR 0.5 MG/DOSE,) 2 MG/1.5ML SOPN INJECT 0.5MG  WEEKLY INTO SKIN 07/30/21   Liane Comber, NP  sildenafil (VIAGRA) 100 MG tablet 1/2-1 pill as needed  1 hour before intercourse, can get with good RX from [facility] 06/02/17   Vladimir Crofts, PA-C  valACYclovir (VALTREX) 1000 MG tablet Take 1 tablet Daily to Prevent Fever Blisters Patient taking differently: as needed. Take 1 tablet Daily to Prevent Fever Blisters 09/19/19   [name], MD    Current Outpatient Medications  Medication Sig Dispense Refill   Ascorbic Acid (VITAMIN C) 500 MG CAPS Take 1 capsule by mouth daily.     aspirin 81 MG chewable tablet Chew 1 tablet (81 mg total) by mouth daily.     Cholecalciferol (VITAMIN D PO) Take 10,000 Units by mouth daily.      diltiazem (CARDIZEM) 120 MG tablet Take 1 tablet by mouth once daily for blood pressure 90 tablet 1   fluticasone (FLONASE) 50 MCG/ACT nasal spray USE ONE SPRAY EACH NOSTRIL TWICE DAILY 16  g 3   Lecithin 1200 MG CAPS Take by mouth daily.     lisinopril (ZESTRIL) 40 MG tablet Take 1 tablet by mouth once daily for blood pressure 90 tablet 1   Magnesium 250 MG TABS Take 500 mg by mouth 2 (two) times a day.      Multiple Vitamins-Minerals (MULTIVITAMIN MEN PO) Take by mouth daily.     Omega-3 Fatty Acids (FISH OIL) 1000 MG CAPS Take 1,200 mg by mouth. Take two tablets in the morning and two tablets in the evening     oxcarbazepine (TRILEPTAL) 600 MG tablet Take 1 tablet (600 mg total) by mouth 2 (two) times daily. 60 tablet 5   rosuvastatin (CRESTOR) 10 MG tablet TAKE 1 TABLET BY MOUTH ONCE DAILY FOR CHOLESTEROL 90 tablet 0   torsemide (DEMADEX) 20 MG tablet Take  1 tablet  1  to 2 x /day for Edema 180 tablet 1   zinc gluconate 50 MG tablet Take 50 mg by mouth daily.     doxycycline (VIBRAMYCIN) 100 MG capsule Take 1 capsule 2 x /day with meals for Infection 60 capsule 0   meloxicam (MOBIC) 15 MG tablet Take  1/2 to 1 tablet  Daily  with Food  for Pain /Inflammation & try limit to 5 days /week to Avoid Kidney Damage 90 tablet 1   Semaglutide,0.25 or 0.5MG /DOS, (OZEMPIC, 0.25 OR 0.5 MG/DOSE,) 2 MG/1.5ML SOPN INJECT 0.5MG  WEEKLY INTO SKIN 6 mL 1   sildenafil (VIAGRA) 100 MG tablet 1/2-1 pill as needed 1 hour before intercourse, can get with good RX from [facility] 10 tablet 2   valACYclovir (VALTREX) 1000 MG tablet Take 1 tablet Daily to Prevent Fever Blisters (Patient taking differently: as needed. Take 1 tablet Daily to Prevent Fever Blisters) 90 tablet 3   Current Facility-Administered Medications  Medication Dose Route Frequency Provider Last Rate Last Admin   0.9 %  sodium chloride infusion  500 mL Intravenous Once Gatha Mayer, MD        Allergies as of 07/31/2021 - Review Complete 07/31/2021  Allergen Reaction Noted   Toprol xl [metoprolol tartrate]  10/14/2013    Family History  Problem Relation Age of Onset   Prostate cancer Father    Colon cancer Maternal Uncle     Prostate cancer Maternal Uncle    Colon cancer Paternal Uncle    Colon cancer Maternal Grandfather    Colon cancer Paternal Grandfather    Stomach cancer Neg Hx    Esophageal cancer Neg Hx    Rectal cancer Neg Hx     Social History   Socioeconomic History  Marital status: Married    Spouse name: Not on file   Number of children: 0   Years of education: Not on file   Highest education level: Some college, no degree  Occupational History    Employer: [employer]  Tobacco Use   Smoking status: Former    Types: Cigarettes    Quit date: 09/20/1989    Years since quitting: 31.8   Smokeless tobacco: Never  Vaping Use   Vaping Use: Never used  Substance and Sexual Activity   Alcohol use: No    Alcohol/week: 0.0 standard drinks   Drug use: No   Sexual activity: Yes    Partners: Female  Other Topics Concern   Not on file  Social History Narrative   Pt is married he lives with spouse   Has no children   Some college education    Right handed   Drinks decaf tea, no coffee, soda sometimes    Social Determinants of Radio broadcast assistant Strain: Not on file  Food Insecurity: Not on file  Transportation Needs: Not on file  Physical Activity: Not on file  Stress: Not on file  Social Connections: Not on file  Intimate Partner Violence: Not on file    Review of Systems:  All other review of systems negative except as mentioned in the HPI.  Physical Exam: Vital signs BP 125/77   Pulse 66   Temp 98.4 F (36.9 C) (Temporal)   Resp 10   Ht 6\' 1"  (1.854 m)   Wt 262 lb (118.8 kg)   SpO2 100%   BMI 34.57 kg/m   General:   Alert,  Well-developed, well-nourished, pleasant and cooperative in NAD Lungs:  Clear throughout to auscultation.   Heart:  Regular rate and rhythm; no murmurs, clicks, rubs,  or gallops. Abdomen:  Soft, nontender and nondistended. Normal bowel sounds.   Neuro/Psych:  Alert and cooperative. Normal mood and affect. A and O x  3   @Britania Shreeve  Simonne Maffucci, MD, [facility] [phone] (pager) 07/31/2021 9:40 AM@

## 2021-07-31 NOTE — Progress Notes (Signed)
Called to room to assist during endoscopic procedure.  Patient ID and intended procedure confirmed with present staff. Received instructions for my participation in the procedure from the performing physician.  

## 2021-07-31 NOTE — Progress Notes (Signed)
VS completed by DT.  Pt's states no medical or surgical changes since previsit or office visit.  

## 2021-07-31 NOTE — Patient Instructions (Addendum)
Three tiny polyps removed. Diverticulosis seen again.  I will let you know pathology results and when to have another routine colonoscopy by mail and/or My Chart.  I appreciate the opportunity to care for you. Gatha Mayer, MD, FACG   YOU HAD AN ENDOSCOPIC PROCEDURE TODAY: Refer to the procedure report and other information in the discharge instructions given to you for any specific questions about what was found during the examination. If this information does not answer your questions, please call [place] office at [phone] to clarify.   YOU SHOULD EXPECT: Some feelings of bloating in the abdomen. Passage of more gas than usual. Walking can help get rid of the air that was put into your GI tract during the procedure and reduce the bloating. If you had a lower endoscopy (such as a colonoscopy or flexible sigmoidoscopy) you may notice spotting of blood in your stool or on the toilet paper. Some abdominal soreness may be present for a day or two, also.  DIET: Your first meal following the procedure should be a light meal and then it is ok to progress to your normal diet. A half-sandwich or bowl of soup is an example of a good first meal. Heavy or fried foods are harder to digest and may make you feel nauseous or bloated. Drink plenty of fluids but you should avoid alcoholic beverages for 24 hours. If you had a esophageal dilation, please see attached instructions for diet.    ACTIVITY: Your care partner should take you home directly after the procedure. You should plan to take it easy, moving slowly for the rest of the day. You can resume normal activity the day after the procedure however YOU SHOULD NOT DRIVE, use power tools, machinery or perform tasks that involve climbing or major physical exertion for 24 hours (because of the sedation medicines used during the test).   SYMPTOMS TO REPORT IMMEDIATELY: A gastroenterologist can be reached at any hour. Please call [phone]  for any of  the following symptoms:  Following lower endoscopy (colonoscopy, flexible sigmoidoscopy) Excessive amounts of blood in the stool  Significant tenderness, worsening of abdominal pains  Swelling of the abdomen that is new, acute  Fever of 100 or higher   FOLLOW UP:  If any biopsies were taken you will be contacted by phone or by letter within the next 1-3 weeks. Call [phone]  if you have not heard about the biopsies in 3 weeks.  Please also call with any specific questions about appointments or follow up tests.

## 2021-07-31 NOTE — Progress Notes (Signed)
PT taken to PACU. Monitors in place. VSS. Report given to RN. 

## 2021-07-31 NOTE — Op Note (Signed)
[name] Procedure Date: 07/31/2021 9:25 AM MRN: [id number] Endoscopist: Gatha Mayer , MD Age: 55 Referring MD:  Date of Birth: 27-Jun-1966 Gender: Male Account #: [id number] Procedure:                Colonoscopy Indications:              Surveillance: Personal history of adenomatous                            polyps on last colonoscopy 3 years ago Medicines:                Propofol per Anesthesia, Monitored Anesthesia Care Procedure:                Pre-Anesthesia Assessment:                           - Prior to the procedure, a History and Physical                            was performed, and patient medications and                            allergies were reviewed. The patient's tolerance of                            previous anesthesia was also reviewed. The risks                            and benefits of the procedure and the sedation                            options and risks were discussed with the patient.                            All questions were answered, and informed consent                            was obtained. Prior Anticoagulants: The patient has                            taken no previous anticoagulant or antiplatelet                            agents. ASA Grade Assessment: III - A patient with                            severe systemic disease. After reviewing the risks                            and benefits, the patient was deemed in                            satisfactory condition to undergo the procedure.  After obtaining informed consent, the colonoscope                            was passed under direct vision. Throughout the                            procedure, the patient's blood pressure, pulse, and                            oxygen saturations were monitored continuously. The                            CF [id number] #[id number] was introduced through the anus                             and advanced to the the cecum, identified by                            appendiceal orifice and ileocecal valve. The                            colonoscopy was somewhat difficult due to                            significant looping. Successful completion of the                            procedure was aided by applying abdominal pressure.                            The patient tolerated the procedure well. The                            quality of the bowel preparation was adequate. The                            bowel preparation used was Miralax via split dose                            instruction. The ileocecal valve, appendiceal                            orifice, and rectum were photographed. Scope In: 9:50:27 AM Scope Out: 10:12:42 AM Scope Withdrawal Time: 0 hours 16 minutes 28 seconds  Total Procedure Duration: 0 hours 22 minutes 15 seconds  Findings:                 The perianal and digital rectal examinations were                            normal. Pertinent negatives include normal prostate                            (size, shape, and consistency).  Three sessile polyps were found in the rectum,                            sigmoid colon and ascending colon. The polyps were                            diminutive in size. These polyps were removed with                            a cold snare. Resection and retrieval were                            complete. Verification of patient identification                            for the specimen was done. Estimated blood loss was                            minimal.                           Multiple small and large-mouthed diverticula were                            found in the sigmoid colon and descending colon.                           The exam was otherwise without abnormality on                            direct and retroflexion views. Complications:            No immediate complications. Estimated  Blood Loss:     Estimated blood loss was minimal. Impression:               - Three diminutive polyps in the rectum, in the                            sigmoid colon and in the ascending colon, removed                            with a cold snare. Resected and retrieved.                           - Severe diverticulosis in the sigmoid colon and in                            the descending colon.                           - The examination was otherwise normal on direct                            and retroflexion views.                           -  Personal history of colonic polyps. 10/2014 -                            traditional ssp                           01/2018 traditional ssa, ssp and a tubular adenoma                           - Family Hx CRCA 2 grandparents, 2 uncles Recommendation:           - Patient has a contact number available for                            emergencies. The signs and symptoms of potential                            delayed complications were discussed with the                            patient. Return to normal activities tomorrow.                            Written discharge instructions were provided to the                            patient.                           - Resume previous diet.                           - Continue present medications.                           - Await pathology results.                           - Repeat colonoscopy is recommended for                            surveillance. The colonoscopy date will be                            determined after pathology results from today's                            exam become available for review.                           - Stick to close f/u given personal polyp hx, FHx                            and prep today. Needs different or extra prep next  time (though MiraLax was ok 2019?) ? better diet                            restriction prior to colonoscopy Gatha Mayer, MD 07/31/2021 10:20:40 AM This report has been signed electronically.

## 2021-08-04 ENCOUNTER — Telehealth: Payer: Self-pay

## 2021-08-04 ENCOUNTER — Encounter: Payer: Self-pay | Admitting: Internal Medicine

## 2021-08-04 NOTE — Telephone Encounter (Signed)
  Follow up Call-  Call back number 07/31/2021  Post procedure Call Back phone  # [phone]  Permission to leave phone message Yes  Some recent data might be hidden     Patient questions:  Do you have a fever, pain , or abdominal swelling? No. Pain Score  0 *  Have you tolerated food without any problems? Yes.    Have you been able to return to your normal activities? Yes.    Do you have any questions about your discharge instructions: Diet   No. Medications  No. Follow up visit  No.  Do you have questions or concerns about your Care? No.  Actions: * If pain score is 4 or above: No action needed, pain <4.  Have you developed a fever since your procedure? No   2.   Have you had an respiratory symptoms (SOB or cough) since your procedure? No   3.   Have you tested positive for COVID 19 since your procedure no   4.   Have you had any family members/close contacts diagnosed with the COVID 19 since your procedure?  No    If yes to any of these questions please route to Joylene John, RN and Joella Prince, RN

## 2021-08-05 ENCOUNTER — Other Ambulatory Visit: Payer: Self-pay | Admitting: Adult Health

## 2021-08-05 DIAGNOSIS — E782 Mixed hyperlipidemia: Secondary | ICD-10-CM

## 2021-08-21 ENCOUNTER — Other Ambulatory Visit: Payer: Self-pay | Admitting: Neurology

## 2021-08-25 NOTE — Progress Notes (Signed)
NEUROLOGY FOLLOW UP OFFICE NOTE  Donald Schwartz [id number]  Assessment/Plan:   Right-sided trigeminal neuralgia  Oxcarbazepine 600mg  twice daily Follow up 8 months  Subjective:  Donald Schwartz is a 55 year old right-handed man with hypertension, CKD stage II, hyperlipidemia and OSA who follows up for right-sided trigeminal neuralgia   UPDATE: Current medication: oxcarbazepine 600mg  BID (900mg  twice daily caused significant hyponatremia)   He had flare up from July to October.  It was dull but would cause pain if he ate or brushed his teeth on the right.  The only thing he can think of that triggered it may have been the shingles shot.   06/05/2021 LABS:  CBC with WBC 5.9, HGB 14, HCT 42.8, PLT 330; CMP with Na 136, K 4.5, Cl 101, CO2 27, Ca 9.4, glucose 110, BUN 13, Cr 1.16, GGR 73, t bili 0.4, ALP 82, AST 30, ALT 25.   HISTORY:  Beginning in January 2016, he started having pain involving the right side of the face.  It started in the right eye, then involving the right top of the head, right side of nose and right upper teeth.  It is a paroxysmal shooting pain lasting a few seconds at a time and occurring several times daily.  It is triggered by lightly touching the cheek, eating or feeling of water running over his head.  Sometimes there is slight tingling but usually not.  Nothing relieves it.  It aborts spontaneously.  There is no numbness or facial weakness.  He was evaluated by the eye doctor and dentist with normal exams.   Prior medications, Lyrica.  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  His insurance would not cover baclofen.  PAST MEDICAL HISTORY: Past Medical History:  Diagnosis Date   Allergy    Arthritis    Elevated hemoglobin A1c    Elevated LDL cholesterol level    Hx of colonic polyp 11/20/2014   Hyperlipidemia    Hypertension    Hypogonadism male    Neuromuscular disorder (HCC)    Trigeminar neuralgia   Sleep apnea     wears CPAP   Vitamin D deficiency     MEDICATIONS: Current Outpatient Medications on File Prior to Visit  Medication Sig Dispense Refill   Ascorbic Acid (VITAMIN C) 500 MG CAPS Take 1 capsule by mouth daily.     aspirin 81 MG chewable tablet Chew 1 tablet (81 mg total) by mouth daily.     Cholecalciferol (VITAMIN D PO) Take 10,000 Units by mouth daily.      diltiazem (CARDIZEM) 120 MG tablet Take 1 tablet by mouth once daily for blood pressure 90 tablet 1   doxycycline (VIBRAMYCIN) 100 MG capsule Take 1 capsule 2 x /day with meals for Infection 60 capsule 0   fluticasone (FLONASE) 50 MCG/ACT nasal spray USE ONE SPRAY EACH NOSTRIL TWICE DAILY 16 g 3   Lecithin 1200 MG CAPS Take by mouth daily.     lisinopril (ZESTRIL) 40 MG tablet Take 1 tablet by mouth once daily for blood pressure 90 tablet 1   Magnesium 250 MG TABS Take 500 mg by mouth 2 (two) times a day.      meloxicam (MOBIC) 15 MG tablet Take  1/2 to 1 tablet  Daily  with Food  for Pain /Inflammation & try limit to 5 days /week to Avoid Kidney Damage 90 tablet 1   Multiple Vitamins-Minerals (MULTIVITAMIN MEN PO) Take by mouth daily.  Omega-3 Fatty Acids (FISH OIL) 1000 MG CAPS Take 1,200 mg by mouth. Take two tablets in the morning and two tablets in the evening     oxcarbazepine (TRILEPTAL) 600 MG tablet Take 1 tablet (600 mg total) by mouth 2 (two) times daily. 60 tablet 5   rosuvastatin (CRESTOR) 10 MG tablet TAKE 1 TABLET BY MOUTH ONCE DAILY FOR CHOLESTEROL 90 tablet 1   Semaglutide,0.25 or 0.5MG /DOS, (OZEMPIC, 0.25 OR 0.5 MG/DOSE,) 2 MG/1.5ML SOPN INJECT 0.5MG  WEEKLY INTO SKIN 6 mL 1   sildenafil (VIAGRA) 100 MG tablet 1/2-1 pill as needed 1 hour before intercourse, can get with good RX from [facility] 10 tablet 2   torsemide (DEMADEX) 20 MG tablet Take  1 tablet  1  to 2 x /day for Edema 180 tablet 1   valACYclovir (VALTREX) 1000 MG tablet Take 1 tablet Daily to Prevent Fever Blisters (Patient taking differently: as  needed. Take 1 tablet Daily to Prevent Fever Blisters) 90 tablet 3   zinc gluconate 50 MG tablet Take 50 mg by mouth daily.     No current facility-administered medications on file prior to visit.    ALLERGIES: Allergies  Allergen Reactions   Toprol Xl [Metoprolol Tartrate]     ED    FAMILY HISTORY: Family History  Problem Relation Age of Onset   Prostate cancer Father    Colon cancer Maternal Uncle    Prostate cancer Maternal Uncle    Colon cancer Paternal Uncle    Colon cancer Maternal Grandfather    Colon cancer Paternal Grandfather    Stomach cancer Neg Hx    Esophageal cancer Neg Hx    Rectal cancer Neg Hx       Objective:  Blood pressure 107/64, pulse 84, height 6\' 1"  (1.854 m), weight 273 lb 3.2 oz (123.9 kg), SpO2 97 %. General: No acute distress.  Patient appears well-groomed.   Head:  Normocephalic/atraumatic Eyes:  Fundi examined but not visualized Neck: supple, no paraspinal tenderness, full range of motion Heart:  Regular rate and rhythm Lungs:  Clear to auscultation bilaterally Back: No paraspinal tenderness Neurological Exam: alert and oriented to person, place, and time.  Speech fluent and not dysarthric, language intact.  CN II-XII intact. Bulk and tone normal, muscle strength 5/5 throughout.  Sensation to light touch intact.  Deep tendon reflexes 2+ throughout, toes downgoing.  Finger to nose testing intact.  Gait normal, Romberg negative.   Metta Clines, DO  CC: [name], MD

## 2021-08-26 ENCOUNTER — Other Ambulatory Visit: Payer: Self-pay

## 2021-08-26 ENCOUNTER — Encounter: Payer: Self-pay | Admitting: Neurology

## 2021-08-26 ENCOUNTER — Ambulatory Visit: Payer: BC Managed Care – PPO | Admitting: Neurology

## 2021-08-26 VITALS — BP 107/64 | HR 84 | Ht 73.0 in | Wt 273.2 lb

## 2021-08-26 DIAGNOSIS — G500 Trigeminal neuralgia: Secondary | ICD-10-CM | POA: Diagnosis not present

## 2021-08-26 MED ORDER — OXCARBAZEPINE 600 MG PO TABS: 600.0000 mg | ORAL_TABLET | Freq: Two times a day (BID) | ORAL | 1 refills | Status: DC

## 2021-08-26 NOTE — Patient Instructions (Signed)
Refilled oxcarbazepine 600mg  twice daily Follow up 8 months

## 2021-12-29 ENCOUNTER — Encounter: Payer: Self-pay | Admitting: Internal Medicine

## 2021-12-29 ENCOUNTER — Ambulatory Visit (INDEPENDENT_AMBULATORY_CARE_PROVIDER_SITE_OTHER): Payer: BC Managed Care – PPO | Admitting: Internal Medicine

## 2021-12-29 VITALS — BP 130/72 | HR 77 | Temp 97.9°F | Resp 16 | Ht 73.0 in | Wt 273.2 lb

## 2021-12-29 DIAGNOSIS — Z79899 Other long term (current) drug therapy: Secondary | ICD-10-CM

## 2021-12-29 DIAGNOSIS — R7309 Other abnormal glucose: Secondary | ICD-10-CM

## 2021-12-29 DIAGNOSIS — I10 Essential (primary) hypertension: Secondary | ICD-10-CM

## 2021-12-29 DIAGNOSIS — E559 Vitamin D deficiency, unspecified: Secondary | ICD-10-CM

## 2021-12-29 DIAGNOSIS — E782 Mixed hyperlipidemia: Secondary | ICD-10-CM

## 2021-12-29 DIAGNOSIS — G500 Trigeminal neuralgia: Secondary | ICD-10-CM

## 2021-12-29 NOTE — Progress Notes (Addendum)
? ?Future Appointments  ?Date Time Provider Department  ?12/29/2021  4:00 PM [name], MD [facility]  ?04/05/2022  8:50 AM Pieter Partridge, DO [facility]  ?06/07/2022 10:00 AM [name], MD [facility]  ? ? ?History of Present Illness: ? ? ?    This very nice 56 y.o.  MWM presents for 6 month follow up with HTN, HLD, Pre-Diabetes and Vitamin D Deficiency.  Patient has long antecedent hx/o Trigeminal  Neuralgia. Patient also is on CPAP for OSA with improved sleep hygiene.  ? ? ?    Patient is treated for HTN (2002)  & BP has been controlled at home. Today?s BP is at goal - 130/72. Patient has had no complaints of any cardiac type chest pain, palpitations, dyspnea / orthopnea / PND, dizziness, claudication, or dependent edema. ? ? ?    Hyperlipidemia is controlled with diet & meds. Patient denies myalgias or other med SE?s. Last Lipids were at  goal ? ?Lab Results  ?Component Value Date  ? CHOL 106 06/05/2021  ? HDL 47 06/05/2021  ? LDLCALC 44 06/05/2021  ? TRIG 72 06/05/2021  ? CHOLHDL 2.3 06/05/2021  ? ? ? ?Also, the patient has history of PreDiabetes (A1c 6.3% /2008) and has had no symptoms of reactive hypoglycemia, diabetic polys, paresthesias or visual blurring.  Last A1c was near goal : ? ?Lab Results  ?Component Value Date  ? HGBA1C 5.7 (H) 06/05/2021  ?  ? ?                                                 Further, the patient also has history of Vitamin D Deficiency ("31" /2008) and supplements vitamin D without any suspected side-effects. Last vitamin D was at goal : ? ?Lab Results  ?Component Value Date  ? VD25OH 92 06/05/2021  ? ? ? ?Current Outpatient Medications on File Prior to Visit  ?Medication Sig  ? VITAMIN C 500 MG CAPS Take 1 capsule  daily.  ? aspirin 81 MG chewable tablet Chew 1 tablet  daily.  ? VITAMIN D Take 10,000 Units daily.   ? diltiazem  120 MG tablet Take 1 tablet daily   ? FLONASE  nasal spray USE ONE SPRAY EACH NOSTRIL TWICE DAILY  ? Lecithin 1200 MG CAPS Take  daily.  ?  lisinopril 40 MG tablet Take 1 tablet daily for blood pressure  ? Magnesium 250 MG TABS Take 500 mg  2  times a day.   ? meloxicam 15 MG tablet Take  1/2 to 1 tablet  Daily    ? MultiVit Norlene Campbell (MEN  Take daily.  ? Omega-3 FISH OIL  1,200 mg  Take Take 2 tablets- morning and 2 tablets -evening  ? oxcarbazepine 600 MG tablet Take 1 tablet 2  times daily.  ? rosuvastatin 10 MG tablet TAKE 1 TABLET DAILY   ? Semaglutide                                                                                                                                                     /  OZEMPIC 0.5 MG/DOSE - 2 MG/1.5ML SOPN INJECT 0.5  MG WEEKLY INTO SKIN  ? sildenafil 100 MG tablet 1/2-1 pill as needed 1 hour before XXXX  ? torsemide 20 MG tablet Take  1 tablet  1  to 2 x /day for Edema  ? valACYclovir 1000 MG t\ Take 1 tablet Daily as needed.  ? zinc 50 MG tablet Take  daily.  ? ? ? ? ?Allergies  ?Allergen Reactions  ? Toprol Xl [Metoprolol Tartrate]   ?  ED  ? ? ? ?PMHx:   ?Past Medical History:  ?Diagnosis Date  ? Allergy   ? Arthritis   ? Elevated hemoglobin A1c   ? Elevated LDL cholesterol level   ? Hx of colonic polyp 11/20/2014  ? Hyperlipidemia   ? Hypertension   ? Hypogonadism male   ? Neuromuscular disorder ([place])   ? Trigeminar neuralgia  ? Sleep apnea   ? wears CPAP  ? Vitamin D deficiency   ? ? ? ?Immunization History  ?Administered Date(s) Administered  ? Influenza Inj Mdck Quad  06/02/2017, 06/28/2018, 07/23/2019  ? Influenza 07/10/2014  ? Influenza 07/19/2016  ? PFIZER SARS-COV-2 Vacc 01/03/2020, 01/28/2020  ? PPD Test 04/18/2019, 05/23/2020, 06/05/2021  ? Pneumococcal-23 09/20/2009  ? Td 09/20/2005  ? Tdap 02/03/2016  ? ? ? ?Past Surgical History:  ?Procedure Laterality Date  ? c5-6 fusion    ? 2012  ? COLONOSCOPY  01/30/2018  ? Dr.Gessner  ? HIP FRACTURE SURGERY Right 1986  ? avulsion/chip fracture ?stress fracture  ? POLYPECTOMY    ? ? ?FHx:    Reviewed / unchanged ? ?SHx:    Reviewed / unchanged  ? ?Systems  Review: ? ?Constitutional: Denies fever, chills, wt changes, headaches, insomnia, fatigue, night sweats, change in appetite. ?Eyes: Denies redness, blurred vision, diplopia, discharge, itchy, watery eyes.  ?ENT: Denies discharge, congestion, post nasal drip, epistaxis, sore throat, earache, hearing loss, dental pain, tinnitus, vertigo, sinus pain, snoring.  ?CV: Denies chest pain, palpitations, irregular heartbeat, syncope, dyspnea, diaphoresis, orthopnea, PND, claudication or edema. ?Respiratory: denies cough, dyspnea, DOE, pleurisy, hoarseness, laryngitis, wheezing.  ?Gastrointestinal: Denies dysphagia, odynophagia, heartburn, reflux, water brash, abdominal pain or cramps, nausea, vomiting, bloating, diarrhea, constipation, hematemesis, melena, hematochezia  or hemorrhoids. ?Genitourinary: Denies dysuria, frequency, urgency, nocturia, hesitancy, discharge, hematuria or flank pain. ?Musculoskeletal: Denies arthralgias, myalgias, stiffness, jt. swelling, pain, limping or strain/sprain.  ?Skin: Denies pruritus, rash, hives, warts, acne, eczema or change in skin lesion(s). ?Neuro: No weakness, tremor, incoordination, spasms, paresthesia or pain. ?Psychiatric: Denies confusion, memory loss or sensory loss. ?Endo: Denies change in weight, skin or hair change.  ?Heme/Lymph: No excessive bleeding, bruising or enlarged lymph nodes. ? ?Physical Exam ? ?BP 130/72   Pulse 77   Temp 97.9 ?F (36.6 ?C)   Resp 16   Ht '6\' 1"'$  (1.854 m)   Wt 273 lb 3.2 oz (123.9 kg)   SpO2 95%   BMI 36.04 kg/m?  ? ?Appears  well nourished, well groomed  and in no distress. ? ?Eyes: PERRLA, EOMs, conjunctiva no swelling or erythema. ?Sinuses: No frontal/maxillary tenderness ?ENT/Mouth: EAC's clear, TM's nl w/o erythema, bulging. Nares clear w/o erythema, swelling, exudates. Oropharynx clear without erythema or exudates. Oral hygiene is good. Tongue normal, non obstructing. Hearing intact.  ?Neck: Supple. Thyroid not palpable. Car 2+/2+ without  bruits, nodes or JVD. ?Chest: Respirations nl with BS clear & equal w/o rales, rhonchi, wheezing or stridor.  ?Cor: Heart sounds normal w/ regular rate and rhythm without sig.  murmurs, gallops, clicks or rubs. Peripheral pulses normal and equal  without edema.  ?Abdomen: Soft & bowel sounds normal. Non-tender w/o guarding, rebound, hernias, masses or organomegaly.  ?Lymphatics: Unremarkable.  ?Musculoskeletal: Full ROM all peripheral extremities, joint stability, 5/5 strength and normal gait.  ?Skin: Warm, dry without exposed rashes, lesions or ecchymosis apparent.  ?Neuro: Cranial nerves intact, reflexes equal bilaterally. Sensory-motor testing grossly intact. Tendon reflexes grossly intact.  ?Pysch: Alert & oriented x 3.  Insight and judgement nl & appropriate. No ideations. ? ?Assessment and Plan: ? ?1 . Essential hypertension ? ?- Continue medication, monitor blood pressure at home.  ?- Continue DASH diet.  Reminder to go to the ER if any CP,  ?SOB, nausea, dizziness, severe HA, changes vision/speech. ?   ?- Continue diet/meds, exercise,& lifestyle modifications.  ?- Continue monitor periodic cholesterol/liver & renal functions  ? ?- CBC with Differential/Platelet ?- COMPLETE METABOLIC PANEL WITH GFR ?- Magnesium ?- TSH ? ?2. Hyperlipidemia, mixed ? ?- Lipid panel ?- TSH ? ?3. Abnormal glucose ? ?- Continue diet, exercise  ?- Lifestyle modifications.  ?- Monitor appropriate labs ?   ?- Hemoglobin A1c ?- Insulin, random ? ?4. Vitamin D deficiency ? ?- Continue supplementation ?  ?- VITAMIN D 25 Hydroxy  ? ?5. Trigeminal neuralgia of right side of face ? ? ?6. Medication management ? ?- CBC with Differential/Platelet ?- COMPLETE METABOLIC PANEL WITH GFR ?- Magnesium ?- Lipid panel ?- TSH ?- Hemoglobin A1c ?- Insulin, random ?- VITAMIN D 25 Hydroxy  ? ? ? ?      Discussed  regular exercise, BP monitoring, weight control to achieve/maintain BMI less than 25 and discussed med and SE's. Recommended labs to assess and  monitor clinical status with further disposition pending results of labs.  I discussed the assessment and treatment plan with the patient. The patient was provided an opportunity to ask questions and all were answered. Terie Purser

## 2021-12-29 NOTE — Patient Instructions (Signed)

## 2021-12-30 LAB — COMPLETE METABOLIC PANEL WITH GFR
AG Ratio: 1.6 (calc) (ref 1.0–2.5)
ALT: 30 U/L (ref 9–46)
AST: 30 U/L (ref 10–35)
Albumin: 4.7 g/dL (ref 3.6–5.1)
Alkaline phosphatase (APISO): 78 U/L (ref 35–144)
BUN/Creatinine Ratio: 11 (calc) (ref 6–22)
BUN: 15 mg/dL (ref 7–25)
CO2: 29 mmol/L (ref 20–32)
Calcium: 9.5 mg/dL (ref 8.6–10.3)
Chloride: 96 mmol/L — ABNORMAL LOW (ref 98–110)
Creat: 1.36 mg/dL — ABNORMAL HIGH (ref 0.70–1.30)
Globulin: 3 g/dL (calc) (ref 1.9–3.7)
Glucose, Bld: 92 mg/dL (ref 65–99)
Potassium: 4.3 mmol/L (ref 3.5–5.3)
Sodium: 135 mmol/L (ref 135–146)
Total Bilirubin: 0.4 mg/dL (ref 0.2–1.2)
Total Protein: 7.7 g/dL (ref 6.1–8.1)
eGFR: 61 mL/min/{1.73_m2} (ref >=60)

## 2021-12-30 LAB — LIPID PANEL
Cholesterol: 103 mg/dL (ref <200)
HDL: 50 mg/dL (ref >=40)
LDL Cholesterol (Calc): 38 mg/dL (calc)
Non-HDL Cholesterol (Calc): 53 mg/dL (calc) (ref <130)
Total CHOL/HDL Ratio: 2.1 (calc) (ref <5.0)
Triglycerides: 66 mg/dL (ref <150)

## 2021-12-30 LAB — CBC WITH DIFFERENTIAL/PLATELET
Absolute Monocytes: 830 cells/uL (ref 200–950)
Basophils Absolute: 63 cells/uL (ref 0–200)
Basophils Relative: 0.8 %
Eosinophils Absolute: 126 cells/uL (ref 15–500)
Eosinophils Relative: 1.6 %
HCT: 39.9 % (ref 38.5–50.0)
Hemoglobin: 13.3 g/dL (ref 13.2–17.1)
Lymphs Abs: 1849 cells/uL (ref 850–3900)
MCH: 28.2 pg (ref 27.0–33.0)
MCHC: 33.3 g/dL (ref 32.0–36.0)
MCV: 84.5 fL (ref 80.0–100.0)
MPV: 8.9 fL (ref 7.5–12.5)
Monocytes Relative: 10.5 %
Neutro Abs: 5032 cells/uL (ref 1500–7800)
Neutrophils Relative %: 63.7 %
Platelets: 341 10*3/uL (ref 140–400)
RBC: 4.72 10*6/uL (ref 4.20–5.80)
RDW: 15 % (ref 11.0–15.0)
Total Lymphocyte: 23.4 %
WBC: 7.9 10*3/uL (ref 3.8–10.8)

## 2021-12-30 LAB — TSH: TSH: 1.78 mIU/L (ref 0.40–4.50)

## 2021-12-30 LAB — HEMOGLOBIN A1C
Hgb A1c MFr Bld: 6.2 % of total Hgb — ABNORMAL HIGH (ref <5.7)
Mean Plasma Glucose: 131 mg/dL
eAG (mmol/L): 7.3 mmol/L

## 2021-12-30 LAB — MAGNESIUM: Magnesium: 2.2 mg/dL (ref 1.5–2.5)

## 2021-12-30 LAB — VITAMIN D 25 HYDROXY (VIT D DEFICIENCY, FRACTURES): Vit D, 25-Hydroxy: 70 ng/mL (ref 30–100)

## 2021-12-30 LAB — INSULIN, RANDOM: Insulin: 39.6 u[IU]/mL — ABNORMAL HIGH

## 2021-12-30 MED ORDER — OZEMPIC (1 MG/DOSE) 4 MG/3ML ~~LOC~~ SOPN: PEN_INJECTOR | SUBCUTANEOUS | 0 refills | Status: DC

## 2021-12-30 NOTE — Addendum Note (Signed)
Addended by: [name] on: 12/30/2021 12:20 AM ? ? Modules accepted: Orders ? ?

## 2022-01-19 ENCOUNTER — Other Ambulatory Visit: Payer: Self-pay | Admitting: Adult Health

## 2022-01-19 DIAGNOSIS — E782 Mixed hyperlipidemia: Secondary | ICD-10-CM

## 2022-01-19 DIAGNOSIS — E66812 Obesity, class 2: Secondary | ICD-10-CM

## 2022-02-20 ENCOUNTER — Emergency Department (HOSPITAL_COMMUNITY)
Admission: EM | Admit: 2022-02-20 | Discharge: 2022-02-20 | Disposition: A | Discharge status: Home / Self Care | Payer: BC Managed Care – PPO | Attending: Emergency Medicine | Admitting: Emergency Medicine

## 2022-02-20 ENCOUNTER — Other Ambulatory Visit: Payer: Self-pay

## 2022-02-20 ENCOUNTER — Encounter (HOSPITAL_COMMUNITY): Payer: Self-pay

## 2022-02-20 DIAGNOSIS — Z7982 Long term (current) use of aspirin: Secondary | ICD-10-CM | POA: Insufficient documentation

## 2022-02-20 DIAGNOSIS — R55 Syncope and collapse: Secondary | ICD-10-CM

## 2022-02-20 DIAGNOSIS — I951 Orthostatic hypotension: Secondary | ICD-10-CM | POA: Insufficient documentation

## 2022-02-20 DIAGNOSIS — Z79899 Other long term (current) drug therapy: Secondary | ICD-10-CM | POA: Insufficient documentation

## 2022-02-20 LAB — BASIC METABOLIC PANEL
Anion gap: 11 (ref 5–15)
BUN: 13 mg/dL (ref 6–20)
CO2: 21 mmol/L — ABNORMAL LOW (ref 22–32)
Calcium: 8.4 mg/dL — ABNORMAL LOW (ref 8.9–10.3)
Chloride: 104 mmol/L (ref 98–111)
Creatinine, Ser: 1.71 mg/dL — ABNORMAL HIGH (ref 0.61–1.24)
GFR, Estimated: 46 mL/min — ABNORMAL LOW (ref >60)
Glucose, Bld: 139 mg/dL — ABNORMAL HIGH (ref 70–99)
Potassium: 3.7 mmol/L (ref 3.5–5.1)
Sodium: 136 mmol/L (ref 135–145)

## 2022-02-20 LAB — CBC WITH DIFFERENTIAL/PLATELET
Abs Immature Granulocytes: 0.03 10*3/uL (ref 0.00–0.07)
Basophils Absolute: 0 10*3/uL (ref 0.0–0.1)
Basophils Relative: 1 %
Eosinophils Absolute: 0.2 10*3/uL (ref 0.0–0.5)
Eosinophils Relative: 3 %
HCT: 34.5 % — ABNORMAL LOW (ref 39.0–52.0)
Hemoglobin: 11.3 g/dL — ABNORMAL LOW (ref 13.0–17.0)
Immature Granulocytes: 0 %
Lymphocytes Relative: 15 %
Lymphs Abs: 1.2 10*3/uL (ref 0.7–4.0)
MCH: 30 pg (ref 26.0–34.0)
MCHC: 32.8 g/dL (ref 30.0–36.0)
MCV: 91.5 fL (ref 80.0–100.0)
Monocytes Absolute: 0.6 10*3/uL (ref 0.1–1.0)
Monocytes Relative: 8 %
Neutro Abs: 5.8 10*3/uL (ref 1.7–7.7)
Neutrophils Relative %: 73 %
Platelets: 314 10*3/uL (ref 150–400)
RBC: 3.77 MIL/uL — ABNORMAL LOW (ref 4.22–5.81)
RDW: 15.7 % — ABNORMAL HIGH (ref 11.5–15.5)
WBC: 7.9 10*3/uL (ref 4.0–10.5)
nRBC: 0 % (ref 0.0–0.2)

## 2022-02-20 LAB — TROPONIN I (HIGH SENSITIVITY): Troponin I (High Sensitivity): 4 ng/L (ref <18)

## 2022-02-20 MED ORDER — SODIUM CHLORIDE 0.9 % IV BOLUS: 1000.0000 mL | Freq: Once | INTRAVENOUS | Status: DC

## 2022-02-20 MED ORDER — SODIUM CHLORIDE 0.9 % IV BOLUS
1000.0000 mL | Freq: Once | INTRAVENOUS | Status: AC
  Administered 2022-02-20: 1000 mL via INTRAVENOUS

## 2022-02-20 NOTE — Discharge Instructions (Addendum)
Your blood work and blood pressure today suggested that you may be dehydrated.  This is likely from use of your diuretic torsemide, and perhaps not drinking enough water at home.  I recommend that you stop taking the torsemide for now.  Continue drinking lots of water at home, until your urine looks like clear.  We will also reduce your blood pressure medication the lisinopril from 40 mg to 20 mg daily.  Please keep the morning diary and log each day of your blood pressure.  You should bring this diary with you to the doctor's office.  Please call your doctor's office to schedule a recheck of your vital signs, as well as your kidney function and basic blood work in 1 week.  Finally, reach out to the [place] or your primary care provider's office about scheduling an echocardiogram.  This would be a routine screening of your heart to make sure that there are no structural problems with your heart.  If your doctor the [place] cannot schedule this image, you can make an appointment with the cardiology group at the heart care number provided above.  Try to stay out of the heat, keep yourself hydrated.  Do not overexert yourself for the next 2 weeks until you are rechecked by your doctor's office.

## 2022-02-20 NOTE — ED Provider Notes (Signed)
[facility] EMERGENCY DEPARTMENT Provider Note   CSN: [id number] Arrival date & time: 02/20/22  1353     History  Chief Complaint  Patient presents with   Syncope    Donald Schwartz is a 56 y.o. male.  56 year old male with prior medical history as detailed below presents for evaluation.  Patient reports that he was outside working in the heat.  Today's temperature is right around 90 degrees.  Patient began to feel fatigued and lightheaded.  He went inside and took a break and drink fluids.  He felt better.  He then proceeded to go back outside to finish his task.  At that point he again began to feel lightheaded and dizzy.  He apparently then had a brief syncopal event in front of his wife.  Patient's wife reports that he was unresponsive for approximately 30 seconds to a minute.  EMS reports the patient was hypotensive and dizzy with attempted standing on their initial evaluation.  Patient was given IV fluids by EMS.  Patient reports that he is feeling better now.  Patient does have history of hyponatremia - reportedly secondary to use of oxcarbazepine at higher dose.  The history is provided by the patient and medical records.  Near Syncope This is a new problem. The current episode started less than 1 hour ago. The problem occurs rarely. The problem has not changed since onset.Pertinent negatives include no chest pain, no abdominal pain, no headaches and no shortness of breath. Nothing aggravates the symptoms. Nothing relieves the symptoms.      Home Medications Prior to Admission medications   Medication Sig Start Date End Date Taking? Authorizing Provider  Ascorbic Acid (VITAMIN C) 500 MG CAPS Take 1 capsule by mouth daily.    [provider]  aspirin 81 MG chewable tablet Chew 1 tablet (81 mg total) by mouth daily. 03/20/18   [name], MD  Cholecalciferol (VITAMIN D PO) Take 10,000 Units by mouth daily.     [provider]  diltiazem  (CARDIZEM) 120 MG tablet Take 1 tablet by mouth once daily for blood pressure 01/20/22   Liane Comber, NP  fluticasone [facility]) 50 MCG/ACT nasal spray USE ONE SPRAY EACH NOSTRIL TWICE DAILY 09/23/13   Vladimir Crofts, PA-C  Lecithin 1200 MG CAPS Take by mouth daily. Patient not taking: Reported on 12/29/2021    [provider]  lisinopril (ZESTRIL) 40 MG tablet Take 1 tablet by mouth once daily for blood pressure 01/20/22   Liane Comber, NP  Magnesium 250 MG TABS Take 500 mg by mouth 2 (two) times a day.     [provider]  meloxicam (MOBIC) 15 MG tablet Take  1/2 to 1 tablet  Daily  with Food  for Pain /Inflammation & try limit to 5 days /week to Avoid Kidney Damage 12/19/20   [name], MD  Multiple Vitamins-Minerals (MULTIVITAMIN MEN PO) Take by mouth daily.    [provider]  Omega-3 Fatty Acids (FISH OIL) 1000 MG CAPS Take 1,200 mg by mouth. Take two tablets in the morning and two tablets in the evening    [provider]  oxcarbazepine (TRILEPTAL) 600 MG tablet Take 1 tablet (600 mg total) by mouth 2 (two) times daily. 08/26/21   Tomi Likens, Adam R, DO  rosuvastatin (CRESTOR) 10 MG tablet TAKE 1 TABLET BY MOUTH ONCE DAILY FOR CHOLESTEROL 01/20/22   Liane Comber, NP  Semaglutide, 1 MG/DOSE, (OZEMPIC, 1 MG/DOSE,) 4 MG/3ML SOPN Inject 1 mg (  0.75 ml)     into the Skin      Once Weekly     for Diabetes 12/30/21   [name], MD  sildenafil (VIAGRA) 100 MG tablet 1/2-1 pill as needed 1 hour before intercourse, can get with good RX from [employer] 06/02/17   Vladimir Crofts, PA-C  torsemide (DEMADEX) 20 MG tablet Take  1 tablet  1  to 2 x /day for Edema Patient not taking: Reported on 12/29/2021 06/30/21   [name], MD  valACYclovir (VALTREX) 1000 MG tablet Take 1 tablet Daily to Prevent Fever Blisters Patient taking differently: as needed. Take 1 tablet Daily to Prevent Fever Blisters 09/19/19   [name], MD  zinc gluconate 50 MG tablet  Take 50 mg by mouth daily.    [provider]      Allergies    Toprol xl [metoprolol tartrate]    Review of Systems   Review of Systems  Respiratory:  Negative for shortness of breath.   Cardiovascular:  Positive for near-syncope. Negative for chest pain.  Gastrointestinal:  Negative for abdominal pain.  Neurological:  Negative for headaches.  All other systems reviewed and are negative.  Physical Exam Updated Vital Signs There were no vitals taken for this visit. Physical Exam Vitals and nursing note reviewed.  Constitutional:      General: He is not in acute distress.    Appearance: Normal appearance. He is well-developed.  HENT:     Head: Normocephalic and atraumatic.  Eyes:     Conjunctiva/sclera: Conjunctivae normal.     Pupils: Pupils are equal, round, and reactive to light.  Cardiovascular:     Rate and Rhythm: Normal rate and regular rhythm.     Heart sounds: Normal heart sounds.  Pulmonary:     Effort: Pulmonary effort is normal. No respiratory distress.     Breath sounds: Normal breath sounds.  Abdominal:     General: There is no distension.     Palpations: Abdomen is soft.     Tenderness: There is no abdominal tenderness.  Musculoskeletal:        General: No deformity. Normal range of motion.     Cervical back: Normal range of motion and neck supple.     Comments: Superficial abrasion noted to the anterior aspect of right knee.  No joint instability noted of the right knee.  No significant tenderness with palpation of the right knee joint.  Skin:    General: Skin is warm and dry.  Neurological:     General: No focal deficit present.     Mental Status: He is alert and oriented to person, place, and time. Mental status is at baseline.     Cranial Nerves: No cranial nerve deficit.     Sensory: No sensory deficit.     Motor: No weakness.    ED Results / Procedures / Treatments   Labs (all labs ordered are listed, but only abnormal results are  displayed) Labs Reviewed  CBC WITH DIFFERENTIAL/PLATELET - Abnormal; Notable for the following components:      Result Value   RBC 3.77 (*)    Hemoglobin 11.3 (*)    HCT 34.5 (*)    RDW 15.7 (*)    All other components within normal limits  BASIC METABOLIC PANEL - Abnormal; Notable for the following components:   CO2 21 (*)    Glucose, Bld 139 (*)    Creatinine, Ser 1.71 (*)    Calcium 8.4 (*)  GFR, Estimated 46 (*)    All other components within normal limits  TROPONIN I (HIGH SENSITIVITY)    EKG EKG Interpretation  Date/Time:  Saturday February 20 2022 14:03:59 EDT Ventricular Rate:  90 PR Interval:  191 QRS Duration: 81 QT Interval:  358 QTC Calculation: 438 R Axis:   70 Text Interpretation: Sinus rhythm Low voltage, precordial leads Confirmed by Dene Gentry [phone]) on 02/20/2022 2:04:55 PM  Radiology No results found.  Procedures Procedures    Medications Ordered in ED Medications  sodium chloride 0.9 % bolus 1,000 mL (has no administration in time range)    ED Course/ Medical Decision Making/ A&P                           Medical Decision Making Amount and/or Complexity of Data Reviewed Labs: ordered.    Medical Screen Complete  This patient presented to the ED with complaint of syncope, lightheadedness.  This complaint involves an extensive number of treatment options. The initial differential diagnosis includes, but is not limited to, dehydration, hyponatremia, metabolic abnormality, AKI, etc.  This presentation is: Acute, Chronic, Self-Limited, Previously Undiagnosed, Uncertain Prognosis, Complicated, Systemic Symptoms, and Threat to Life/Bodily Function  Patient is presenting after apparent syncopal event.  Patient is reporting that he was outside working in high heat and high humidity when he passed out.  Patient feels improved after removal for high ambient temperatures to clear setting.  Patient feels improved with IV fluids given by  EMS.  Patient hypotensive on arrival.  This is improved with fluids.   Pending re-evaluation and disposition signed out to oncoming EDP.    Co morbidities that complicated the patient's evaluation  History of hyponatremia   Additional history obtained:  Additional history obtained from EMS and Family External records from outside sources obtained and reviewed including prior ED visits and prior Inpatient records.    Lab Tests:  I ordered and personally interpreted labs.  The pertinent results include: CBC, BMP   Cardiac Monitoring:  The patient was maintained on a cardiac monitor.  I personally viewed and interpreted the cardiac monitor which showed an underlying rhythm of: NSR   Medicines ordered:  I ordered medication including IV fluids for suspected dehydration Reevaluation of the patient after these medicines showed that the patient: improved   Problem List / ED Course:  Syncope   Reevaluation:  After the interventions noted above, I reevaluated the patient and found that they have: improved   Disposition:  After consideration of the diagnostic results and the patients response to treatment, I feel that the patent would benefit from completion of ED workup.          Final Clinical Impression(s) / ED Diagnoses Final diagnoses:  Near syncope  Orthostatic hypotension    Rx / DC Orders ED Discharge Orders     None         Valarie Merino, MD 02/22/22 1036

## 2022-02-20 NOTE — ED Triage Notes (Signed)
Pt came in via POV d/t a syncopal episode that his wife witnessed. Pt states he felt lightheaded so he guided himself to the ground & leaned against the wall & then he lost consciousness. Wife stated that he had been c/o feeling tired & dizzy for about a week. EMS reports his initial BP was 70/40 then he was dizzy when he stood. While en route to ED he was given a 1L NS in a 18 g PIV in Rt hand. Upon arrival to ED he was A/Ox4, 84/70, 90 bpm, CBG 145.

## 2022-02-20 NOTE — ED Provider Notes (Signed)
Patient received as signout from Dr Francia Greaves EDP.  Briefly, this is a 56 yo male presenting with syncope, working in high heat today outside.  He and his wife reports that it was high heat humidity, he was sweating outside, then began to feel lightheaded, had near syncope for perhaps a minute or 2, although his wife is present reports he did not completely lose consciousness, but was mumbling and seemed confused afterwards.  EMS reported initially had hypotension in the field and was dizzy when trying to stand up and he was given a liter of fluid.  He now in the ER feels back to normal.  He does take lisinopril 40 mg daily for blood pressure, reports his average blood pressure is 962 systolic.  He is also on torsemide 20 mg daily for leg swelling and has been taking his daily for months.  He reports to me he is also donated 2 units of blood last week.  Labs today show some elevation of his creatinine, also a mild hemoglobin drop, which I suspect is consistent with both overdiuresis, dehydration, and also blood donation, which coupled with his orthostasis, is consistent with heat exhaustion and dehydration as a cause of his lightheadedness.  I have a lower suspicion at this time for ACS or pulmonary embolism.  His blood pressure has improved with fluids.  He is mentating normally.  I discussed with him and his wife discontinuing torsemide for now, as it is only being taken for extremity edema.  I advised to continue to drink water aggressively to keep himself hydrated.  We will also cut in half his lisinopril dose to 20 mg daily, and have him check his blood pressure daily, follow-up with his doctor as an outpatient.  I did advise that he talk to his PCP in about an outpatient echocardiogram given this was his first episode of near syncope versus syncope, and he will do so.  He also follows with the [facility] reports he has a stress test scheduled as a routine outpatient cardiac work-up.  *  Orthostatic  vital signs reassessed and normal after IV fluids.  Patient is able to ambulate steadily.  Okay for discharge    Wyvonnia Dusky, MD 02/20/22 [phone]

## 2022-02-23 ENCOUNTER — Ambulatory Visit (INDEPENDENT_AMBULATORY_CARE_PROVIDER_SITE_OTHER): Payer: BC Managed Care – PPO | Admitting: Internal Medicine

## 2022-02-23 ENCOUNTER — Encounter: Payer: Self-pay | Admitting: Internal Medicine

## 2022-02-23 VITALS — BP 118/72 | HR 90 | Temp 96.9°F | Resp 16 | Ht 73.0 in | Wt 265.8 lb

## 2022-02-23 DIAGNOSIS — Z79899 Other long term (current) drug therapy: Secondary | ICD-10-CM

## 2022-02-23 DIAGNOSIS — N179 Acute kidney failure, unspecified: Secondary | ICD-10-CM

## 2022-02-23 DIAGNOSIS — D6489 Other specified anemias: Secondary | ICD-10-CM | POA: Diagnosis not present

## 2022-02-23 DIAGNOSIS — T671XXA Heat syncope, initial encounter: Secondary | ICD-10-CM

## 2022-02-23 NOTE — Progress Notes (Signed)
Future Appointments  Date Time Provider Department  03/31/2022  2:30 PM Darrol Jump, NP [facility]  04/05/2022  8:50 AM Pieter Partridge, DO [facility]  07/08/2022 11:00 AM [name], MD [facility]    History of Present Illness:     The patient is very nice 56 y.o. MWM  with HTN, HLD, Pre-Diabetes, Trigeminal  Neuralgia, OSA/CPAP  & Vitamin D Deficiency who was evaluated  /treated in the ER 3 days ago for Presyncope/Syncope after working outside becoming  "overheated", but Hgb was decreased  most likely dilutional from IVF & patient also relate donating blood 1-2 weeks ago .  Troponin was negative & EKG was Normal. He is apparently already scheduled for a cardiac stress test at the [place].  He was advised to stop his diuretic (torsemide) and cut his Lisinopril 40 mg to 1/2 tab and actually he has held it since the ER visit as BP monitoring has been normal .    GFR had dropped from  81-88 down to   46 .    Patient denied any premonitory HA, palpitations, CP or dyspnea.  Medications     Semaglutide, 1 MG/DOSE, (OZEMPIC, 1 MG/DOSE,) 4 MG/3ML SOPN, Inject 1 mg (0.75 ml)     into the Skin      Once Weekly     for Diabetes    diltiazem (CARDIZEM) 120 MG tablet, Take 1 tablet daily - holding   lisinopril (ZESTRIL) 40 MG tablet, Take 1 tablet  daily - holding   rosuvastatin  10 MG tablet, TAKE 1 TABLET  DAILY    sildenafil (VIAGRA) 100 MG tablet, 1/2-1 pill as needed daily    torsemide  20 MG tablet, Take  1 tablet  1  to 2 x /day for Edema (Patient not taking: Reported on 12/29/2021)    FLONASE nasal spray, USE ONE SPRAY EACH NOSTRIL TWICE DAILY   aspirin 81 MG tablet, Chew 1 tablet daily.   meloxicam 15 MG tablet, Take  1/2 to 1 tablet  Daily     VITAMIN C 500 MG CAPS, Take 1 capsule daily.   VITAMIN D , Take 10,000 Units  daily.    Magnesium 250 MG TABS, Take 500 mg 2 times a day.    Multiple Vitamins-Minerals , Take daily.   Omega-3 FISH OIL , Take 1,200 mg b. Take two tablets in the  morning and two tablets in the evening   oxcarbazepine  600 MG tablet, Take 1 tablet  2 times daily.   valACYclovir 1000 MG tablet, Take 1 tablet Daily to Prevent Fever Blisters (taking  as needed)   zinc 50 MG tablet, Take  daily.  Problem list He has Hypertension; Hyperlipidemia; Other abnormal glucose (prediabetes); Vitamin D deficiency; OSA on CPAP; CKD (chronic kidney disease) stage 2, GFR 60-89 ml/min; Hx of colonic polyp + FHx CRCA; Medication management; Trigeminal neuralgia of right side of face; Morbid obesity ([place]) - BMI 30+ with OSA; and Fatty liver on their problem list.   Observations/Objective:  BP 118/72   Pulse 90   Temp (!) 96.9 F (36.1 C)   Resp 16   Ht '6\' 1"'$  (1.854 m)   Wt 265 lb 12.8 oz (120.6 kg)   SpO2 98%   BMI 35.07 kg/m   Postural         Sitting BP 126/81     P  79            &  Standing      BP 122/76      P  87  HEENT - WNL. Neck - supple.  Chest - Clear equal BS. Cor - Nl HS. RRR w/o sig MGR. PP 1(+). No edema. MS- FROM w/o deformities.  Gait Nl. Neuro -  Nl w/o focal abnormalities.  Assessment and Plan:  1. Heat syncope, initial encounter  - CBC with Differential/Platelet - COMPLETE METABOLIC PANEL WITH GFR  2. AKI (acute kidney injury) ([place])  - COMPLETE METABOLIC PANEL WITH GFR  3. Medication management  - CBC with Differential/Platelet - COMPLETE METABOLIC PANEL WITH GFR   Follow Up Instructions:      I discussed the assessment and treatment plan with the patient. The patient was provided an opportunity to ask questions and all were answered. The patient agreed with the plan and demonstrated an understanding of the instructions.       The patient was advised to call back or seek an in-person evaluation if the symptoms worsen or if the condition fails to improve as anticipated.   Kirtland Bouchard, MD

## 2022-02-23 NOTE — Patient Instructions (Signed)
Syncope, Adult  Syncope refers to a condition in which a person temporarily loses consciousness. Syncope may also be called fainting or passing out. It is caused by a sudden decrease in blood flow to the brain. This can happen for a variety of reasons. Most causes of syncope are not dangerous. It can be triggered by things such as needle sticks, seeing blood, pain, or intense emotion. However, syncope can also be a sign of a serious medical problem, such as a heart abnormality. Other causes can include dehydration, migraines, or taking medicines that lower blood pressure. Your health care provider may do tests to find the reason why you are having syncope. If you faint, get medical help right away. Call your local emergency services (911 in the [place] Follow these instructions at home: Pay attention to any changes in your symptoms. Take these actions to stay safe and to help relieve your symptoms: Knowing when you may be about to faint Signs that you may be about to faint include: Feeling dizzy, weak, light-headed, or like the room is spinning. Feeling nauseous. Seeing spots or seeing all white or all black in your field of vision. Having cold, clammy skin or feeling warm and sweaty. Hearing ringing in the ears (tinnitus). If you start to feel like you might faint, sit or lie down right away. If sitting, put your head down between your legs. If lying down, raise (elevate) your feet above the level of your heart. Breathe deeply and steadily. Wait until all the symptoms have passed. Have someone stay with you until you feel stable. Medicines Take over-the-counter and prescription medicines only as told by your health care provider. If you are taking blood pressure or heart medicine, get up slowly and take several minutes to sit and then stand. This can reduce dizziness and decrease the risk of syncope. Lifestyle Do not drive, use machinery, or play sports until your health care provider says it is  okay. Do not drink alcohol. Do not use any products that contain nicotine or tobacco. These products include cigarettes, chewing tobacco, and vaping devices, such as e-cigarettes. If you need help quitting, ask your health care provider. Avoid hot tubs and saunas. General instructions Talk with your health care provider about your symptoms. You may need to have testing to understand the cause of your syncope. Drink enough fluid to keep your urine pale yellow. Avoid prolonged standing. If you must stand for a long time, do movements such as: Moving your legs. Crossing your legs. Flexing and stretching your leg muscles. Squatting. Keep all follow-up visits. This is important. Contact a health care provider if: You have episodes of near fainting. Get help right away if: You faint. You hit your head or are injured after fainting. You have any of these symptoms that may indicate trouble with your heart: Fast or irregular heartbeats (palpitations). Unusual pain in your chest, abdomen, or back. Shortness of breath. You have a seizure. You have a severe headache. You are confused. You have vision problems. You have severe weakness or trouble walking. You are bleeding from your mouth or rectum, or you have black or tarry stool. These symptoms may represent a serious problem that is an emergency. Do not wait to see if your symptoms will go away. Get medical help right away. Call your local emergency services (911 in the [place] Do not drive yourself to the hospital. Summary Syncope refers to a condition in which a person temporarily loses consciousness. Syncope may also be called   fainting or passing out. It is caused by a sudden decrease in blood flow to the brain. Signs that you may be about to faint include dizziness, feeling light-headed, feeling nauseous, sudden vision changes, or cold, clammy skin. Even though most causes of syncope are not dangerous, syncope can be a sign of a serious  medical problem. Get help right away if you faint. If you start to feel like you might faint, sit or lie down right away. If sitting, put your head down between your legs. If lying down, raise (elevate) your feet above the level of your heart.

## 2022-02-24 LAB — CBC WITH DIFFERENTIAL/PLATELET
Absolute Monocytes: 884 cells/uL (ref 200–950)
Basophils Absolute: 47 cells/uL (ref 0–200)
Basophils Relative: 0.7 %
Eosinophils Absolute: 261 cells/uL (ref 15–500)
Eosinophils Relative: 3.9 %
HCT: 36 % — ABNORMAL LOW (ref 38.5–50.0)
Hemoglobin: 11.9 g/dL — ABNORMAL LOW (ref 13.2–17.1)
Lymphs Abs: 1702 cells/uL (ref 850–3900)
MCH: 29.4 pg (ref 27.0–33.0)
MCHC: 33.1 g/dL (ref 32.0–36.0)
MCV: 88.9 fL (ref 80.0–100.0)
MPV: 8.9 fL (ref 7.5–12.5)
Monocytes Relative: 13.2 %
Neutro Abs: 3806 cells/uL (ref 1500–7800)
Neutrophils Relative %: 56.8 %
Platelets: 390 10*3/uL (ref 140–400)
RBC: 4.05 10*6/uL — ABNORMAL LOW (ref 4.20–5.80)
RDW: 15.1 % — ABNORMAL HIGH (ref 11.0–15.0)
Total Lymphocyte: 25.4 %
WBC: 6.7 10*3/uL (ref 3.8–10.8)

## 2022-02-24 LAB — COMPLETE METABOLIC PANEL WITH GFR
AG Ratio: 1.5 (calc) (ref 1.0–2.5)
ALT: 29 U/L (ref 9–46)
AST: 30 U/L (ref 10–35)
Albumin: 4.5 g/dL (ref 3.6–5.1)
Alkaline phosphatase (APISO): 76 U/L (ref 35–144)
BUN: 12 mg/dL (ref 7–25)
CO2: 25 mmol/L (ref 20–32)
Calcium: 9.8 mg/dL (ref 8.6–10.3)
Chloride: 98 mmol/L (ref 98–110)
Creat: 1.26 mg/dL (ref 0.70–1.30)
Globulin: 3 g/dL (calc) (ref 1.9–3.7)
Glucose, Bld: 94 mg/dL (ref 65–99)
Potassium: 4.1 mmol/L (ref 3.5–5.3)
Sodium: 137 mmol/L (ref 135–146)
Total Bilirubin: 0.3 mg/dL (ref 0.2–1.2)
Total Protein: 7.5 g/dL (ref 6.1–8.1)
eGFR: 67 mL/min/{1.73_m2} (ref >=60)

## 2022-02-24 NOTE — Progress Notes (Signed)
<><><><><><><><><><><><><><><><><><><> <><><><><><><><><><><><><><><><><><><>  -    Labs  All OK - Please continue meds same  <><><><><><><><><><><><><><><><><><><> <><><><><><><><><><><><><><><><><><><>

## 2022-03-09 ENCOUNTER — Other Ambulatory Visit: Payer: Self-pay | Admitting: Neurology

## 2022-03-10 NOTE — Telephone Encounter (Signed)
Given enough until appt July with Dr. Tomi Likens

## 2022-03-31 ENCOUNTER — Ambulatory Visit (INDEPENDENT_AMBULATORY_CARE_PROVIDER_SITE_OTHER): Payer: BC Managed Care – PPO | Admitting: Nurse Practitioner

## 2022-03-31 ENCOUNTER — Other Ambulatory Visit: Payer: Self-pay | Admitting: Internal Medicine

## 2022-03-31 ENCOUNTER — Encounter: Payer: Self-pay | Admitting: Nurse Practitioner

## 2022-03-31 VITALS — BP 108/72 | HR 76 | Temp 97.5°F | Ht 73.0 in | Wt 264.6 lb

## 2022-03-31 DIAGNOSIS — I10 Essential (primary) hypertension: Secondary | ICD-10-CM

## 2022-03-31 DIAGNOSIS — R239 Unspecified skin changes: Secondary | ICD-10-CM

## 2022-03-31 DIAGNOSIS — Z79899 Other long term (current) drug therapy: Secondary | ICD-10-CM

## 2022-03-31 DIAGNOSIS — D6489 Other specified anemias: Secondary | ICD-10-CM | POA: Diagnosis not present

## 2022-03-31 DIAGNOSIS — T671XXA Heat syncope, initial encounter: Secondary | ICD-10-CM

## 2022-03-31 DIAGNOSIS — N179 Acute kidney failure, unspecified: Secondary | ICD-10-CM | POA: Diagnosis not present

## 2022-03-31 MED ORDER — OZEMPIC (2 MG/DOSE) 8 MG/3ML ~~LOC~~ SOPN: PEN_INJECTOR | SUBCUTANEOUS | 1 refills | Status: DC

## 2022-03-31 NOTE — Progress Notes (Signed)
FOLLOW UP  Assessment & Plan  1. Primary hypertension Controlled, slightly hypotensive in office today--Asymptomatic. No additional medication changes at this time. Continue NOT taking Lisinopril, Torsemide, Diltiazem Continue to monitor BP- if >130/90 consistently contact office. Continue compression stockings for BLE edema. Continue to follow with [facility] 05/2022 Hospital suggest echocardiogram - has not been completed. Discuss Stress Test. EKG normal in the past.   2. Heat syncope, initial encounter Resolved.  3. AKI (acute kidney injury) ([place]) Resolved. Continue to stay well hydrated.  4. Anemia due to heat Resolved.  5. Recent skin changes Possible seborrheic keratosis. Has not seen Derm since 10/2019. Refer back to Dermatology for further review and evaluation.  -Ambulatory referral to Dermatology.  6. Medication management All medications discussed and reviewed in full. All questions and concerns regarding medications addressed.     Over 20 minutes of exam, counseling, chart review, and critical decision making was performed.    Future Appointments  Date Time Provider [place]  04/05/2022  8:50 AM Pieter Partridge, DO [facility] None  07/08/2022 11:00 AM [name], MD [facility] None    ----------------------------------------------------------------------------------------------------------------------  HPI  56 y.o. male  presents for 1 month follow up after being treated in the ER on  02/20/22 for near syncopal episode.  He did not completely lose consciousness, but was mumbling and seemed confused afterwards.  EMS reported initially had hypotension and was administered 1L fluid.  ER blood work revealed elevation in creatinine and mild Hb drop which was suspected to be due to both over diuresis, dehydration and blood donation to which he report giving 1-2 weeks prior.  During last month OV he was asked to stop torsemide and to cut in half his lisinopril  dose to 20 mg daily from 40 mg daily. He is no longer taking torsemide, lisinopril or diltiazem.  He is checking at home and average reading is ~137/82.    He was asked to follow up with a outpatient echocardiogram but follows with [facility].  He has had follow up with [place] hospital within the last month to which he reports only and EKG was completed. States this was normal.  It was noted that he was also supposed to have a stress test but he reports this has not yet been completed.  [facility] and [facility].  Reports this was normal.  He will have f/u on 06/01/22.    He denies chest pain, shortness of breath, dizziness, syncope.  He has a place on his right cheek that has "re-appeared."  States that he had spot there once removed by dermatology.  Review of notes states seborrheic keratosis.  He is due to a skin check.   Current Medications:  Current Outpatient Medications on File Prior to Visit  Medication Sig   Ascorbic Acid (VITAMIN C) 500 MG CAPS Take 1 capsule by mouth daily.   aspirin 81 MG chewable tablet Chew 1 tablet (81 mg total) by mouth daily.   Cholecalciferol (VITAMIN D PO) Take 10,000 Units by mouth daily.    diltiazem (CARDIZEM) 120 MG tablet Take 1 tablet by mouth once daily for blood pressure   fluticasone (FLONASE) 50 MCG/ACT nasal spray USE ONE SPRAY EACH NOSTRIL TWICE DAILY   lisinopril (ZESTRIL) 40 MG tablet Take 1 tablet by mouth once daily for blood pressure   Magnesium 250 MG TABS Take 500 mg by mouth 2 (two) times a day.    meloxicam (MOBIC) 15 MG  tablet Take  1/2 to 1 tablet  Daily  with Food  for Pain /Inflammation & try limit to 5 days /week to Avoid Kidney Damage   Multiple Vitamins-Minerals (MULTIVITAMIN MEN PO) Take by mouth daily.   Omega-3 Fatty Acids (FISH OIL) 1000 MG CAPS Take 1,200 mg by mouth. Take two tablets in the morning and two tablets in the evening   oxcarbazepine (TRILEPTAL) 600 MG tablet Take 1 tablet by mouth  twice daily   rosuvastatin (CRESTOR) 10 MG tablet TAKE 1 TABLET BY MOUTH ONCE DAILY FOR CHOLESTEROL   Semaglutide, 1 MG/DOSE, (OZEMPIC, 1 MG/DOSE,) 4 MG/3ML SOPN Inject 1 mg (0.75 ml)     into the Skin      Once Weekly     for Diabetes   sildenafil (VIAGRA) 100 MG tablet 1/2-1 pill as needed 1 hour before intercourse, can get with good RX from [employer]   torsemide (DEMADEX) 20 MG tablet Take  1 tablet  1  to 2 x /day for Edema   valACYclovir (VALTREX) 1000 MG tablet Take 1 tablet Daily to Prevent Fever Blisters (Patient taking differently: as needed. Take 1 tablet Daily to Prevent Fever Blisters)   zinc gluconate 50 MG tablet Take 50 mg by mouth daily.   No current facility-administered medications on file prior to visit.     Allergies:  Allergies  Allergen Reactions   Toprol Xl [Metoprolol Tartrate]     ED     Medical History:  Past Medical History:  Diagnosis Date   Allergy    Arthritis    Elevated hemoglobin A1c    Elevated LDL cholesterol level    Hx of colonic polyp 11/20/2014   Hyperlipidemia    Hypertension    Hypogonadism male    Neuromuscular disorder (HCC)    Trigeminar neuralgia   Sleep apnea    wears CPAP   Vitamin D deficiency    Family history- Reviewed and unchanged Social history- Reviewed and unchanged   Review of Systems: All review systems reviewed and negative except for pertinent positives in history of present illness.  Physical Exam: BP 108/72   Pulse 76   Temp (!) 97.5 F (36.4 C)   Ht '6\' 1"'$  (1.854 m)   Wt 264 lb 9.6 oz (120 kg)   SpO2 96%   BMI 34.91 kg/m  Wt Readings from Last 3 Encounters:  03/31/22 264 lb 9.6 oz (120 kg)  02/23/22 265 lb 12.8 oz (120.6 kg)  12/29/21 273 lb 3.2 oz (123.9 kg)   General Appearance: Well nourished, in no apparent distress. Eyes: PERRLA, EOMs, conjunctiva no swelling or erythema Sinuses: No Frontal/maxillary tenderness ENT/Mouth: Ext aud canals clear, TMs without erythema, bulging. No erythema,  swelling, or exudate on post pharynx.  Tonsils not swollen or erythematous. Hearing normal.  Neck: Supple, thyroid normal.  Respiratory: Respiratory effort normal, BS equal bilaterally without rales, rhonchi, wheezing or stridor.  Cardio: RRR with no MRGs. Brisk peripheral pulses without edema.  Abdomen: Soft, + BS.  Non tender, no guarding, rebound, hernias, masses. Lymphatics: Non tender without lymphadenopathy.  Musculoskeletal: Full ROM, 5/5 strength, Normal gait Skin: Right cheek just above beard line with slightly raised, irregularly shaped skin colored lesion.  Pin size dark black circular center.   Neuro: Cranial nerves intact. No cerebellar symptoms.  Psych: Awake and oriented X 3, normal affect, Insight and Judgment appropriate.    Darrol Jump, NP 2:40 PM [facility]

## 2022-03-31 NOTE — Patient Instructions (Signed)
Seborrheic Keratosis A seborrheic keratosis is a common, noncancerous (benign) skin growth. These growths are velvety, waxy, rough, tan, brown, or black spots that appear on the skin. These skin growths can be flat or raised, and scaly. What are the causes? The cause of this condition is not known. What increases the risk? You are more likely to develop this condition if you: Have a family history of seborrheic keratosis. Are 50 or older. Are pregnant. Have had estrogen replacement therapy. What are the signs or symptoms? Symptoms of this condition include growths on the face, chest, shoulders, back, or other areas. These growths: Are usually painless, but may become irritated and itchy. Can be yellow, brown, black, or other colors. Are slightly raised or have a flat surface. Are sometimes rough or wart-like in texture. Are often velvety or waxy on the surface. Are round or oval-shaped. Often occur in groups, but may occur as a single growth. How is this diagnosed? This condition is diagnosed with a medical history and physical exam. A sample of the growth may be tested (skin biopsy). You may need to see a skin specialist (dermatologist). How is this treated? Treatment is not usually needed for this condition, unless the growths are irritated or bleed often. You may also choose to have the growths removed if you do not like their appearance. Most commonly, these growths are treated with a procedure in which liquid nitrogen is applied to "freeze" off the growth (cryosurgery). They may also be burned off with electricity (electrocautery) or removed by scraping (curettage). Follow these instructions at home: Watch your growth for any changes. Keep all follow-up visits as told by your health care provider. This is important. Do not scratch or pick at the growth or growths. This can cause them to become irritated or infected. Contact a health care provider if: You suddenly have many new  growths. Your growth bleeds, itches, or hurts. Your growth suddenly becomes larger or changes color. Summary A seborrheic keratosis is a common, noncancerous (benign) skin growth. Treatment is not usually needed for this condition, unless the growths are irritated or bleed often. Watch your growth for any changes. Contact a health care provider if you suddenly have many new growths or your growth suddenly becomes larger or changes color. Keep all follow-up visits as told by your health care provider. This is important. This information is not intended to replace advice given to you by your health care provider. Make sure you discuss any questions you have with your health care provider. Document Revised: 07/01/2021 Document Reviewed: 07/01/2021 Elsevier Patient Education  2023 Elsevier Inc.  

## 2022-04-02 NOTE — Progress Notes (Unsigned)
NEUROLOGY FOLLOW UP OFFICE NOTE  NAVON KOTOWSKI [id number]  Assessment/Plan:     Right-sided trigeminal neuralgia   Oxcarbazepine '600mg'$  twice daily Follow up in 12 months   Subjective:  Flora Ratz is a 56 year old right-handed man with hypertension, CKD stage II, hyperlipidemia and OSA who follows up for right-sided trigeminal neuralgia   UPDATE: Current medication: oxcarbazepine '600mg'$  BID ('900mg'$  twice daily caused significant hyponatremia)   No flares.    Last month experiencing near syncope.  Found to have anemia (had given blood the prior week) and dehydration  Keeping hydrated now.  Off the diuretic and on antihypertensive only as needed. Feeling better.  No recurrence.  02/23/2022 LABS: CMP with Na 137, K 4.1, Cl 98, CO2 25, Ca 9.8, glucose 94, BUN 12, Cr 1.26, GFR 67, t bili 0.3, ALP 76, AST 30, ALT 29; CBC with WBC 6.7, HGB 11.9, HCT 36, PLT 390    HISTORY:  Beginning in January 2016, he started having pain involving the right side of the face.  It started in the right eye, then involving the right top of the head, right side of nose and right upper teeth.  It is a paroxysmal shooting pain lasting a few seconds at a time and occurring several times daily.  It is triggered by lightly touching the cheek, eating or feeling of water running over his head.  Sometimes there is slight tingling but usually not.  Nothing relieves it.  It aborts spontaneously.  There is no numbness or facial weakness.  He was evaluated by the eye doctor and dentist with normal exams.   Prior medications, Lyrica.  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  His insurance would not cover baclofen.  PAST MEDICAL HISTORY: Past Medical History:  Diagnosis Date   Allergy    Arthritis    Elevated hemoglobin A1c    Elevated LDL cholesterol level    Hx of colonic polyp 11/20/2014   Hyperlipidemia    Hypertension    Hypogonadism male    Neuromuscular disorder  (HCC)    Trigeminar neuralgia   Sleep apnea    wears CPAP   Vitamin D deficiency     MEDICATIONS: Current Outpatient Medications on File Prior to Visit  Medication Sig Dispense Refill   Ascorbic Acid (VITAMIN C) 500 MG CAPS Take 1 capsule by mouth daily.     aspirin 81 MG chewable tablet Chew 1 tablet (81 mg total) by mouth daily.     Cholecalciferol (VITAMIN D PO) Take 10,000 Units by mouth daily.      diltiazem (CARDIZEM) 120 MG tablet Take 1 tablet by mouth once daily for blood pressure 90 tablet 1   fluticasone (FLONASE) 50 MCG/ACT nasal spray USE ONE SPRAY EACH NOSTRIL TWICE DAILY 16 g 3   lisinopril (ZESTRIL) 40 MG tablet Take 1 tablet by mouth once daily for blood pressure 90 tablet 1   Magnesium 250 MG TABS Take 500 mg by mouth 2 (two) times a day.      meloxicam (MOBIC) 15 MG tablet Take  1/2 to 1 tablet  Daily  with Food  for Pain /Inflammation & try limit to 5 days /week to Avoid Kidney Damage 90 tablet 1   Multiple Vitamins-Minerals (MULTIVITAMIN MEN PO) Take by mouth daily.     Omega-3 Fatty Acids (FISH OIL) 1000 MG CAPS Take 1,200 mg by mouth. Take two tablets in the morning and two tablets in the evening  oxcarbazepine (TRILEPTAL) 600 MG tablet Take 1 tablet by mouth twice daily 60 tablet 0   rosuvastatin (CRESTOR) 10 MG tablet TAKE 1 TABLET BY MOUTH ONCE DAILY FOR CHOLESTEROL 90 tablet 1   Semaglutide, 2 MG/DOSE, (OZEMPIC, 2 MG/DOSE,) 8 MG/3ML SOPN Inject 2 mg into the Skin every 7 days for Diabetes  ( Dx:  e11.29 ) 9 mL 1   sildenafil (VIAGRA) 100 MG tablet 1/2-1 pill as needed 1 hour before intercourse, can get with good RX from [employer] 10 tablet 2   torsemide (DEMADEX) 20 MG tablet Take  1 tablet  1  to 2 x /day for Edema 180 tablet 1   valACYclovir (VALTREX) 1000 MG tablet Take 1 tablet Daily to Prevent Fever Blisters (Patient taking differently: as needed. Take 1 tablet Daily to Prevent Fever Blisters) 90 tablet 3   zinc gluconate 50 MG tablet Take 50 mg by  mouth daily.     No current facility-administered medications on file prior to visit.    ALLERGIES: Allergies  Allergen Reactions   Toprol Xl [Metoprolol Tartrate]     ED    FAMILY HISTORY: Family History  Problem Relation Age of Onset   Prostate cancer Father    Colon cancer Maternal Uncle    Prostate cancer Maternal Uncle    Colon cancer Paternal Uncle    Colon cancer Maternal Grandfather    Colon cancer Paternal Grandfather    Stomach cancer Neg Hx    Esophageal cancer Neg Hx    Rectal cancer Neg Hx       Objective:  Blood pressure 129/74, pulse 87, height '6\' 1"'$  (1.854 m), weight 264 lb 3.2 oz (119.8 kg), SpO2 96 %. General: No acute distress.  Patient appears well-groomed.      Metta Clines, DO  CC: [name], MD

## 2022-04-05 ENCOUNTER — Encounter: Payer: Self-pay | Admitting: Neurology

## 2022-04-05 ENCOUNTER — Ambulatory Visit: Payer: BC Managed Care – PPO | Admitting: Neurology

## 2022-04-05 VITALS — BP 129/74 | HR 87 | Ht 73.0 in | Wt 264.2 lb

## 2022-04-05 DIAGNOSIS — G500 Trigeminal neuralgia: Secondary | ICD-10-CM | POA: Diagnosis not present

## 2022-04-05 MED ORDER — OXCARBAZEPINE 600 MG PO TABS: 600.0000 mg | ORAL_TABLET | Freq: Two times a day (BID) | ORAL | 11 refills | Status: AC

## 2022-05-03 ENCOUNTER — Telehealth: Payer: Self-pay

## 2022-05-03 NOTE — Telephone Encounter (Signed)
Appeal sent to [employer] [place] for the patient's Ozempic previously denied.. The patient has been notified of the appeal.

## 2022-06-07 ENCOUNTER — Encounter: Payer: BC Managed Care – PPO | Admitting: Internal Medicine

## 2022-07-07 NOTE — Patient Instructions (Addendum)
Due to recent changes in healthcare laws, you may see the results of your imaging and laboratory studies on MyChart before your provider has had a chance to review them.  We understand that in some cases there may be results that are confusing or concerning to you. Not all laboratory results come back in the same time frame and the provider may be waiting for multiple results in order to interpret others.  Please give us 48 hours in order for your provider to thoroughly review all the results before contacting the office for clarification of your results.  ° °++++++++++++++++++++++++++++++++++++++ ° Vit D  & °Vit C 1,000 mg   °are recommended to help protect  °against the Covid-19 and other Corona viruses.  ° ° Also it's recommended  °to take  °Zinc 50 mg  °to help  °protect against the Covid-19   °and best place to get ° is also on [employer]  °and don't pay more than 6-8 cents /pill !  °=============================== °Coronavirus (COVID-19) Are you at risk? ° °Are you at risk for the Coronavirus (COVID-19)? ° °To be considered HIGH RISK for Coronavirus (COVID-19), you have to meet the following criteria: ° °Traveled to [place], [place], [place], [place] or [place]; or in the [place] to [place], [place], [place]  °or [place]; and have fever, cough, and shortness of breath within the last 2 weeks of travel OR °Been in close contact with a person diagnosed with COVID-19 within the last 2 weeks and have  °fever, cough,and shortness of breath ° °IF YOU DO NOT MEET THESE CRITERIA, YOU ARE CONSIDERED LOW RISK FOR COVID-19. ° °What to do if you are HIGH RISK for COVID-19? ° °If you are having a medical emergency, call 911. °Seek medical care right away. Before you go to a doctor’s office, urgent care or emergency department, ° call ahead and tell them about your recent travel, contact with someone diagnosed with COVID-19  ° and your symptoms.  °You should receive instructions from your physician’s office  regarding next steps of care.  °When you arrive at healthcare provider, tell the healthcare staff immediately you have returned from  °visiting [place], [place], [place], [place] or [place]; or traveled in the [place] to [place], [place],  °[place] or [place] in the last two weeks or you have been in close contact with a person diagnosed with  °COVID-19 in the last 2 weeks.   °Tell the health care staff about your symptoms: fever, cough and shortness of breath. °After you have been seen by a medical provider, you will be either: °Tested for (COVID-19) and discharged home on quarantine except to seek medical care if  °symptoms worsen, and asked to  °Stay home and avoid contact with others until you get your results (4-5 days)  °Avoid travel on public transportation if possible (such as bus, train, or airplane) or °Sent to the Emergency Department by EMS for evaluation, COVID-19 testing  and  °possible admission depending on your condition and test results. ° °What to do if you are LOW RISK for COVID-19? ° °Reduce your risk of any infection by using the same precautions used for avoiding the common cold or flu:  °Wash your hands often with soap and warm water for at least 20 seconds.  If soap and water are not readily available,  °use an alcohol-based hand sanitizer with at least 60% alcohol.  °If coughing or sneezing, cover your mouth and nose by coughing   or sneezing into the elbow areas of your shirt or coat, ° into a tissue or into your sleeve (not your hands). °Avoid shaking hands with others and consider head nods or verbal greetings only. °Avoid touching your eyes, nose, or mouth with unwashed hands.  °Avoid close contact with people who are sick. °Avoid places or events with large numbers of people in one location, like concerts or sporting events. °Carefully consider travel plans you have or are making. °If you are planning any travel outside or inside the [place], visit the CDC’s Travelers’ Health  webpage for the latest health notices. °If you have some symptoms but not all symptoms, continue to monitor at home and seek medical attention  °if your symptoms worsen. °If you are having a medical emergency, call 911. °>>>>>>>>>>>>>>>>>>>>>>>>>>>> °Preventive Care for Adults ° °A healthy lifestyle and preventive care can promote health and wellness. Preventive health guidelines for men include the following key practices: °A routine yearly physical is a good way to check with your health care provider about your health and preventative screening. It is a chance to share any concerns and updates on your health and to receive a thorough exam. °Visit your dentist for a routine exam and preventative care every 6 months. Brush your teeth twice a day and floss once a day. Good oral hygiene prevents tooth decay and gum disease. °The frequency of eye exams is based on your age, health, family medical history, use of contact lenses, and other factors. Follow your health care provider's recommendations for frequency of eye exams. °Eat a healthy diet. Foods such as vegetables, fruits, whole grains, low-fat dairy products, and lean protein foods contain the nutrients you need without too many calories. Decrease your intake of foods high in solid fats, added sugars, and salt. Eat the right amount of calories for you. Get information about a proper diet from your health care provider, if necessary. °Regular physical exercise is one of the most important things you can do for your health. Most adults should get at least 150 minutes of moderate-intensity exercise (any activity that increases your heart rate and causes you to sweat) each week. In addition, most adults need muscle-strengthening exercises on 2 or more days a week. °Maintain a healthy weight. The body mass index (BMI) is a screening tool to identify possible weight problems. It provides an estimate of body fat based on height and weight. Your health care provider can  find your BMI and can help you achieve or maintain a healthy weight. For adults 20 years and older: °A BMI below 18.5 is considered underweight. °A BMI of 18.5 to 24.9 is normal. °A BMI of 25 to 29.9 is considered overweight. °A BMI of 30 and above is considered obese. °Maintain normal blood lipids and cholesterol levels by exercising and minimizing your intake of saturated fat. Eat a balanced diet with plenty of fruit and vegetables. Blood tests for lipids and cholesterol should begin at age 20 and be repeated every 5 years. If your lipid or cholesterol levels are high, you are over 50, or you are at high risk for heart disease, you may need your cholesterol levels checked more frequently. Ongoing high lipid and cholesterol levels should be treated with medicines if diet and exercise are not working. °If you smoke, find out from your health care provider how to quit. If you do not use tobacco, do not start. °Lung cancer screening is recommended for adults aged 55-80 years who are at high risk for   developing lung cancer because of a history of smoking. A yearly low-dose CT scan of the lungs is recommended for people who have at least a 30-pack-year history of smoking and are a current smoker or have quit within the past 15 years. A pack year of smoking is smoking an average of 1 pack of cigarettes a day for 1 year (for example: 1 pack a day for 30 years or 2 packs a day for 15 years). Yearly screening should continue until the smoker has stopped smoking for at least 15 years. Yearly screening should be stopped for people who develop a health problem that would prevent them from having lung cancer treatment. °If you choose to drink alcohol, do not have more than 2 drinks per day. One drink is considered to be 12 ounces (355 mL) of beer, 5 ounces (148 mL) of wine, or 1.5 ounces (44 mL) of liquor. °Avoid use of street drugs. Do not share needles with anyone. Ask for help if you need support or instructions about  stopping the use of drugs. °High blood pressure causes heart disease and increases the risk of stroke. Your blood pressure should be checked at least every 1-2 years. Ongoing high blood pressure should be treated with medicines, if weight loss and exercise are not effective. °If you are 45-79 years old, ask your health care provider if you should take aspirin to prevent heart disease. °Diabetes screening involves taking a blood sample to check your fasting blood sugar level. This should be done once every 3 years, after age 45, if you are within normal weight and without risk factors for diabetes. Testing should be considered at a younger age or be carried out more frequently if you are overweight and have at least 1 risk factor for diabetes. °Colorectal cancer can be detected and often prevented. Most routine colorectal cancer screening begins at the age of 50 and continues through age 75. However, your health care provider may recommend screening at an earlier age if you have risk factors for colon cancer. On a yearly basis, your health care provider may provide home test kits to check for hidden blood in the stool. Use of a small camera at the end of a tube to directly examine the colon (sigmoidoscopy or colonoscopy) can detect the earliest forms of colorectal cancer. Talk to your health care provider about this at age 50, when routine screening begins. Direct exam of the colon should be repeated every 5-10 years through age 75, unless early forms of precancerous polyps or small growths are found. ° Talk with your health care provider about prostate cancer screening. °Testicular cancer screening isrecommended for adult males. Screening includes self-exam, a health care provider exam, and other screening tests. Consult with your health care provider about any symptoms you have or any concerns you have about testicular cancer. °Use sunscreen. Apply sunscreen liberally and repeatedly throughout the day. You should  seek shade when your shadow is shorter than you. Protect yourself by wearing long sleeves, pants, a wide-brimmed hat, and sunglasses year round, whenever you are outdoors. °Once a month, do a whole-body skin exam, using a mirror to look at the skin on your back. Tell your health care provider about new moles, moles that have irregular borders, moles that are larger than a pencil eraser, or moles that have changed in shape or color. °Stay current with required vaccines (immunizations). °Influenza vaccine. All adults should be immunized every year. °Tetanus, diphtheria, and acellular pertussis (Td, Tdap) vaccine. An   adult who has not previously received Tdap or who does not know his vaccine status should receive 1 dose of Tdap. This initial dose should be followed by tetanus and diphtheria toxoids (Td) booster doses every 10 years. Adults with an unknown or incomplete history of completing a 3-dose immunization series with Td-containing vaccines should begin or complete a primary immunization series including a Tdap dose. Adults should receive a Td booster every 10 years. °Varicella vaccine. An adult without evidence of immunity to varicella should receive 2 doses or a second dose if he has previously received 1 dose. °Human papillomavirus (HPV) vaccine. Males aged 13-21 years who have not received the vaccine previously should receive the 3-dose series. Males aged 22-26 years may be immunized. Immunization is recommended through the age of 26 years for any male who has sex with males and did not get any or all doses earlier. Immunization is recommended for any person with an immunocompromised condition through the age of 26 years if he did not get any or all doses earlier. During the 3-dose series, the second dose should be obtained 4-8 weeks after the first dose. The third dose should be obtained 24 weeks after the first dose and 16 weeks after the second dose. °Zoster vaccine. One dose is recommended for adults  aged 60 years or older unless certain conditions are present. ° °PREVNAR  - Pneumococcal 13-valent conjugate (PCV13) vaccine. When indicated, a person who is uncertain of his immunization history and has no record of immunization should receive the PCV13 vaccine. An adult aged 19 years or older who has certain medical conditions and has not been previously immunized should receive 1 dose of PCV13 vaccine. This PCV13 should be followed with a dose of pneumococcal polysaccharide (PPSV23) vaccine. The PPSV23 vaccine dose should be obtained at least 1 r more year(s) after the dose of PCV13 vaccine. An adult aged 19 years or older who has certain medical conditions and previously received 1 or more doses of PPSV23 vaccine should receive 1 dose of PCV13. The PCV13 vaccine dose should be obtained 1 or more years after the last PPSV23 vaccine dose. ° °PNEUMOVAX - Pneumococcal polysaccharide (PPSV23) vaccine. When PCV13 is also indicated, PCV13 should be obtained first. All adults aged 65 years and older should be immunized. An adult younger than age 65 years who has certain medical conditions should be immunized. Any person who resides in a nursing home or long-term care facility should be immunized. An adult smoker should be immunized. People with an immunocompromised condition and certain other conditions should receive both PCV13 and PPSV23 vaccines. People with human immunodeficiency virus (HIV) infection should be immunized as soon as possible after diagnosis. Immunization during chemotherapy or radiation therapy should be avoided. Routine use of PPSV23 vaccine is not recommended for American Indians, [place] Natives, or people younger than 65 years unless there are medical conditions that require PPSV23 vaccine. When indicated, people who have unknown immunization and have no record of immunization should receive PPSV23 vaccine. One-time revaccination 5 years after the first dose of PPSV23 is recommended for people  aged 19-64 years who have chronic kidney failure, nephrotic syndrome, asplenia, or immunocompromised conditions. People who received 1-2 doses of PPSV23 before age 65 years should receive another dose of PPSV23 vaccine at age 65 years or later if at least 5 years have passed since the previous dose. Doses of PPSV23 are not needed for people immunized with PPSV23 at or after age 65 years. ° °Hepatitis A vaccine.   Adults who wish to be protected from this disease, have certain high-risk conditions, work with hepatitis A-infected animals, work in hepatitis A research labs, or travel to or work in countries with a high rate of hepatitis A should be immunized. Adults who were previously unvaccinated and who anticipate close contact with an international adoptee during the first 60 days after arrival in the [place] from a country with a high rate of hepatitis A should be immunized. ° °Hepatitis B vaccine. Adults should be immunized if they wish to be protected from this disease, have certain high-risk conditions, may be exposed to blood or other infectious body fluids, are household contacts or sex partners of hepatitis B positive people, are clients or workers in certain care facilities, or travel to or work in countries with a high rate of hepatitis B. ° °Preventive Service / Frequency ° °Ages 40 to 64 °Blood pressure check. °Lipid and cholesterol check °Lung cancer screening. / Every year if you are aged 55-80 years and have a 30-pack-year history of smoking and currently smoke or have quit within the past 15 years. Yearly screening is stopped once you have quit smoking for at least 15 years or develop a health problem that would prevent you from having lung cancer treatment. °Fecal occult blood test (FOBT) of stool. / Every year beginning at age 50 and continuing until age 75. You may not have to do this test if you get a colonoscopy every 10 years. °Flexible sigmoidoscopy** or colonoscopy.** / Every 5 years for  a flexible sigmoidoscopy or every 10 years for a colonoscopy beginning at age 50 and continuing until age 75. °Screening for abdominal aortic aneurysm (AAA)  by ultrasound is recommended for people who have history of high blood pressure or who are current or former smokers. °+++++++++++ °Recommend Adult Low Dose Aspirin or  °coated  Aspirin 81 mg daily  °To reduce risk of Colon Cancer 40 %, ° Skin Cancer 26 % ,  °Malignant Melanoma 46% ° and  °Pancreatic cancer 60% °++++++++++++++++++++ °Vitamin D goal ° is between 70-100.  °Please make sure that you are taking your Vitamin D as directed.  °It is very important as a natural anti-inflammatory  °helping hair, skin, and nails, as well as reducing stroke and heart attack risk.  °It helps your bones and helps with mood. °It also decreases numerous cancer risks so please take it as directed.  °Low Vit D is associated with a 200-300% higher risk for CANCER  °and 200-300% higher risk for HEART   ATTACK  &  STROKE.   °...................................... °It is also associated with higher death rate at younger ages,  °autoimmune diseases like Rheumatoid arthritis, Lupus, Multiple Sclerosis.    °Also many other serious conditions, like depression, Alzheimer's °Dementia, infertility, muscle aches, fatigue, fibromyalgia - just to name a few. °+++++++++++++++++++++ °Recommend the book "The END of DIETING" by Dr Joel Fuhrman  °& the book "The END of DIABETES " by Dr Joel Fuhrman °At [employer] - get book & Audio CD's  °  Being diabetic has a  300% increased risk for heart attack, stroke, cancer, and alzheimer- type vascular dementia. It is very important that you work harder with diet by avoiding all foods that are white. Avoid white rice (brown & wild rice is OK), white potatoes (sweetpotatoes in moderation is OK), White bread or wheat bread or anything made out of white flour like bagels, donuts, rolls, buns, biscuits, cakes, pastries, cookies, pizza crust, and pasta (made    from white flour & egg whites) - vegetarian pasta or spinach or wheat pasta is OK. Multigrain breads like Arnold's or [employer], or multigrain sandwich thins or flatbreads.  Diet, exercise and weight loss can reverse and cure diabetes in the early stages.  Diet, exercise and weight loss is very important in the control and prevention of complications of diabetes which affects every system in your body, ie. Brain - dementia/stroke, eyes - glaucoma/blindness, heart - heart attack/heart failure, kidneys - dialysis, stomach - gastric paralysis, intestines - malabsorption, nerves - severe painful neuritis, circulation - gangrene & loss of a leg(s), and finally cancer and Alzheimers. ° °  I recommend avoid fried & greasy foods,  sweets/candy, white rice (brown or wild rice or Quinoa is OK), white potatoes (sweet potatoes are OK) - anything made from white flour - bagels, doughnuts, rolls, buns, biscuits,white and wheat breads, pizza crust and traditional pasta made of white flour & egg white(vegetarian pasta or spinach or wheat pasta is OK).  Multi-grain bread is OK - like multi-grain flat bread or sandwich thins. Avoid alcohol in excess. Exercise is also important. ° °  Eat all the vegetables you want - avoid meat, especially red meat and dairy - especially cheese.  Cheese is the most concentrated form of trans-fats which is the worst thing to clog up our arteries. Veggie cheese is OK which can be found in the fresh produce section at [employer] or [employer] or [employer] ° °++++++++++++++++++++++ °DASH Eating Plan ° °DASH stands for "Dietary Approaches to Stop Hypertension."  ° °The DASH eating plan is a healthy eating plan that has been shown to reduce high blood pressure (hypertension). Additional health benefits may include reducing the risk of type 2 diabetes mellitus, heart disease, and stroke. The DASH eating plan may also help with weight loss. °WHAT DO I NEED TO KNOW ABOUT THE DASH EATING PLAN? °For  the DASH eating plan, you will follow these general guidelines: °Choose foods with a percent daily value for sodium of less than 5% (as listed on the food label). °Use salt-free seasonings or herbs instead of table salt or sea salt. °Check with your health care provider or pharmacist before using salt substitutes. °Eat lower-sodium products, often labeled as "lower sodium" or "no salt added." °Eat fresh foods. °Eat more vegetables, fruits, and low-fat dairy products. °Choose whole grains. Look for the word "whole" as the first word in the ingredient list. °Choose fish  °Limit sweets, desserts, sugars, and sugary drinks. °Choose heart-healthy fats. °Eat veggie cheese  °Eat more home-cooked food and less restaurant, buffet, and fast food. °Limit fried foods. °Cook foods using methods other than frying. °Limit canned vegetables. If you do use them, rinse them well to decrease the sodium. °When eating at a restaurant, ask that your food be prepared with less salt, or no salt if possible. °                  °   WHAT FOODS CAN I EAT? °Read Dr Joel Fuhrman's books on The End of Dieting & The End of Diabetes ° °Grains °Whole grain or whole wheat bread. Brown rice. Whole grain or whole wheat pasta. Quinoa, bulgur, and whole grain cereals. Low-sodium cereals. Corn or whole wheat flour tortillas. Whole grain cornbread. Whole grain crackers. Low-sodium crackers. ° °Vegetables °Fresh or frozen vegetables (raw, steamed, roasted, or grilled). Low-sodium or reduced-sodium tomato and vegetable juices. Low-sodium or reduced-sodium tomato sauce and paste. Low-sodium or reduced-sodium canned vegetables.  ° °  Fruits All fresh, canned (in natural juice), or frozen fruits.  Protein Products  All fish and seafood.  Dried beans, peas, or lentils. Unsalted nuts and seeds. Unsalted canned beans.  Dairy Low-fat dairy products, such as skim or 1% milk, 2% or reduced-fat cheeses, low-fat ricotta or cottage cheese, or plain low-fat yogurt.  Low-sodium or reduced-sodium cheeses.  Fats and Oils Tub margarines without trans fats. Light or reduced-fat mayonnaise and salad dressings (reduced sodium). Avocado. Safflower, olive, or canola oils. Natural peanut or almond butter.  Other Unsalted popcorn and pretzels. The items listed above may not be a complete list of recommended foods or beverages. Contact your dietitian for more options.  +++++++++++++++++++  WHAT FOODS ARE NOT RECOMMENDED? Grains/ White flour or wheat flour White bread. White pasta. White rice. Refined cornbread. Bagels and croissants. Crackers that contain trans fat.  Vegetables  Creamed or fried vegetables. Vegetables in a . Regular canned vegetables. Regular canned tomato sauce and paste. Regular tomato and vegetable juices.  Fruits Dried fruits. Canned fruit in light or heavy syrup. Fruit juice.  Meat and Other Protein Products Meat in general - RED meat & White meat.  Fatty cuts of meat. Ribs, chicken wings, all processed meats as bacon, sausage, bologna, salami, fatback, hot dogs, bratwurst and packaged luncheon meats.  Dairy Whole or 2% milk, cream, half-and-half, and cream cheese. Whole-fat or sweetened yogurt. Full-fat cheeses or blue cheese. Non-dairy creamers and whipped toppings. Processed cheese, cheese spreads, or cheese curds.  Condiments Onion and garlic salt, seasoned salt, table salt, and sea salt. Canned and packaged gravies. Worcestershire sauce. Tartar sauce. Barbecue sauce. Teriyaki sauce. Soy sauce, including reduced sodium. Steak sauce. Fish sauce. Oyster sauce. Cocktail sauce. Horseradish. Ketchup and mustard. Meat flavorings and tenderizers. Bouillon cubes. Hot sauce. Tabasco sauce. Marinades. Taco seasonings. Relishes.  Fats and Oils Butter, stick margarine, lard, shortening and bacon fat. Coconut, palm kernel, or palm oils. Regular salad dressings.  Pickles and olives. Salted popcorn and pretzels.  The items listed above may not  be a complete list of foods and beverages to avoid.  ================================================  Fatty Liver Disease  The liver converts food into energy, removes toxic material from the blood, makes important proteins, and absorbs necessary vitamins from food. Fatty liver disease occurs when too much fat has built up in your liver cells. Fatty liver disease is also called hepatic steatosis. In many cases, fatty liver disease does not cause symptoms or problems. It is often diagnosed when tests are being done for other reasons. However, over time, fatty liver can cause inflammation that may lead to more serious liver problems, such as scarring of the liver (cirrhosis) and liver failure. Fatty liver is associated with insulin resistance, increased body fat, high blood pressure (hypertension), and high cholesterol. These are features of metabolic syndrome and increase your risk for stroke, diabetes, and heart disease. What are the causes? This condition may be caused by components of metabolic syndrome: Obesity. Insulin resistance. High cholesterol. Other causes: Alcohol abuse. Poor nutrition. Cushing syndrome. Pregnancy. Certain drugs. Poisons. Some viral infections. What increases the risk? You are more likely to develop this condition if you: Abuse alcohol. Are overweight. Have diabetes. Have hepatitis. Have a high triglyceride level. Are pregnant. What are the signs or symptoms? Fatty liver disease often does not cause symptoms. If symptoms do develop, they can include: Fatigue and weakness. Weight loss. Confusion. Nausea, vomiting, or abdominal pain. Yellowing of your skin and the white parts of your eyes (jaundice). Itchy  skin. How is this diagnosed? This condition may be diagnosed by: A physical exam and your medical history. Blood tests. Imaging tests, such as an ultrasound, CT scan, or MRI. A liver biopsy. A small sample of liver tissue is removed using a  needle. The sample is then looked at under a microscope. How is this treated? Fatty liver disease is often caused by other health conditions. Treatment for fatty liver may involve medicines and lifestyle changes to manage conditions such as: Alcoholism. High cholesterol. Diabetes. Being overweight or obese. Follow these instructions at home:  Do not drink alcohol. If you have trouble quitting, ask your health care provider how to safely quit with the help of medicine or a supervised program. This is important to keep your condition from getting worse. Eat a healthy diet as told by your health care provider. Ask your health care provider about working with a dietitian to develop an eating plan. Exercise regularly. This can help you lose weight and control your cholesterol and diabetes. Talk to your health care provider about an exercise plan and which activities are best for you. Take over-the-counter and prescription medicines only as told by your health care provider. Keep all follow-up visits. This is important. Contact a health care provider if: You have trouble controlling your: Blood sugar. This is especially important if you have diabetes. Cholesterol. Drinking of alcohol. Get help right away if: You have abdominal pain. You have jaundice. You have nausea and are vomiting. You vomit blood or material that looks like coffee grounds. You have stools that are black, tar-like, or bloody. Summary Fatty liver disease develops when too much fat builds up in the cells of your liver. Fatty liver disease often causes no symptoms or problems. However, over time, fatty liver can cause inflammation that may lead to more serious liver problems, such as scarring of the liver (cirrhosis). You are more likely to develop this condition if you abuse alcohol, are pregnant, are overweight, have diabetes, have hepatitis, or have high triglyceride or cholesterol levels. Contact your health care provider  if you have trouble controlling your blood sugar, cholesterol, or drinking of alcohol. This information is not intended to replace advice given to you by your health care provider. Make sure you discuss any questions you have with your health care provider. Document Revised: 06/19/2020 Document Reviewed: 06/19/2020 Elsevier Patient Education  [place].

## 2022-07-07 NOTE — Progress Notes (Signed)
Annual  Screening/Preventative Visit  & Comprehensive Evaluation & Examination  Future Appointments  Date Time Provider Department  07/08/2022                       cpe 11:00 AM [name], MD [facility]  04/06/2023  8:50 AM Pieter Partridge, DO [facility]  07/14/2023                       cpe 11:00 AM [name], MD [facility]              This very nice 56 y.o. MWM  with HTN, HLD, diet controlled  DM and Vitamin D Deficiency  presents for a Screening /Preventative Visit & comprehensive evaluation and management of multiple medical co-morbidities.   Patient has OSA on CPAP with improved Restorative Sleep. Patient is followed by Dr Tomi Likens for chronic Trigeminal Neuralgia.        Today patient c/o approx 6-8 week prodrome of RLQ pains. No weight loss, N/V ,or change in BMs, hematochezia or Melena.        HTN predates circa 2002. Patient's BP has been controlled at home.  Today's BP is at goal - 130/80. Patient denies any cardiac symptoms as chest pain, palpitations, shortness of breath, dizziness or ankle swelling.       Patient's hyperlipidemia is controlled with diet and  Rosuvastatin. Patient denies myalgias or other medication SE's. Last lipids were at goal:  Lab Results  Component Value Date   CHOL 103 12/29/2021   HDL 50 12/29/2021   LDLCALC 38 12/29/2021   TRIG 66 12/29/2021   CHOLHDL 2.1 12/29/2021           Patient has Morbid Obesity (BMI 35+) and consequent PreDiabetes  (A1c 6.3% /2008) and then diet controlled diabetes. Patient denies reactive hypoglycemic symptoms, visual blurring, diabetic polys or paresthesias. Last A1c was not at goal:   Lab Results  Component Value Date   HGBA1C 6.2 (H) 12/29/2021         Patient has been treated in the past with parenteral replacement  and has been on  Testosteronewith improved stamina & sense of well being, but apparently stopped it 2 years ago .         Finally, patient has history of Vitamin D Deficiency  ("31" /2008) and last vitamin D was at goal:   Lab Results  Component Value Date   VD25OH 42 12/29/2021         Current Outpatient Medications on File Prior to Visit  Medication Sig   VITAMIN C 500 MG CAPS Take 1 capsule daily.   aspirin 81 MG Chew 1 tablet  daily.   VITAMIN D 10,000 Units   Take daily.    diltiazem 120 MG tablet Take  1 tablet  Daily  for BP   doxycycline 100 MG capsule Take  1 capsule  2 x /day  with meals for  skin Infection   FLONASE   nasal spray USE ONE SPRAY EACH NOSTRIL TWICE DAILY   furosemide 40 MG tablet Take  1 tablet  Daily  for Fluid Retention    Lecithin 1200 MG CAPS Take by mouth daily.   lisinopril  40 MG tablet Take 1 tablet once daily for blood pressure   Magnesium 250 MG TABS Take 500 mg times a day.    meloxicam (15 MG tablet Take  1/2 to 1 tablet  Daily  with  Food    Multiple Vitamins-Minerals  Take  daily.   Omega-3  FISH OIL 1000 MG  Take two tablets 2 x /day    oxcarbazepine (TRILEPTAL) 600 MG Take 1 tablet 2  times daily.   rosuvastatin  10 MG tablet TAKE 1 TABLET  ONCE DAILY    Semaglutide,0.25 or 0.'5MG'$ /DOS 2 MG/1.5ML  Inject 0.5 mg  weekly into skin   sildenafil (VIAGRA) 100 MG tablet 1/2-1 pill as needed 1 hour before XXXX   valACYclovir (VALTREX) 1000 MG tablet Take 1 tablet Daily to Prevent Fever Blisters   zinc 50 MG tablet Take 50 mg by mouth daily.    Allergies  Allergen Reactions   Toprol Xl [Metoprolol Tartrate]     ED     Past Medical History:  Diagnosis Date   Allergy    Arthritis    Elevated hemoglobin A1c    Elevated LDL cholesterol level    Hx of colonic polyp 11/20/2014   Hypertension    Hypogonadism male    Neuromuscular disorder (HCC)    Trigeminar neuralgia   Sleep apnea    wears CPAP   Vitamin D deficiency      Health Maintenance  Topic Date Due   COVID-19 Vaccine (3 - Pfizer risk series) 02/25/2020   INFLUENZA VACCINE  04/20/2021   Zoster Vaccines- Shingrix (2 of 2) 07/23/2021   TETANUS/TDAP   02/02/2026   Hepatitis C Screening  Completed   HIV Screening  Completed   HPV VACCINES  Aged Out     Immunization History  Administered Date(s) Administered   Influenza Inj Mdck Quad  06/02/2017, 06/28/2018, 07/23/2019   Influenza  07/10/2014   Influenza 07/19/2016   PFIZER SARS-COV-2 Vacc 01/03/2020, 01/28/2020   PPD Test 04/18/2019, 05/23/2020   Pneumococcal - 23 09/20/2009   Td 09/20/2005   Tdap 02/03/2016   Colon - 01/30/2018 - Dr Carlean Purl - polyps  Last Colon  - 07/31/2021 - Dr Carlean Purl - Polyps -Recc 5 yr  f/u due Nov 2027   Past Surgical History:  Procedure Laterality Date   c5-6 fusion     2012   COLONOSCOPY     [place] Right 1986   avulsion/chip fracture/ stress fracture     Family History  Problem Relation Age of Onset   Colon cancer Maternal Uncle    Prostate cancer Maternal Uncle    Colon cancer Paternal Uncle    Colon cancer Maternal Grandfather    Colon cancer Paternal Grandfather    Prostate cancer Father    Stomach cancer Neg Hx    Esophageal cancer Neg Hx     Social History   Socioeconomic History   Marital status: Married    Spouse name: Not on file   Number of children: 0   Highest education level: Some college,  Occupational History    Employer: [employer]  Tobacco Use   Smoking status: Former    Types: Cigarettes    Quit date: 09/20/1989    Years since quitting: 31.7   Smokeless tobacco: Never  Vaping Use   Vaping Use: Never used  Substance and Sexual Activity   Alcohol use: No    Alcohol/week: 0.0 standard drinks   Drug use: No   Sexual activity: Yes    Partners: Female     ROS Constitutional: Denies fever, chills, weight loss/gain, headaches, insomnia,  night sweats or change in appetite. Does c/o fatigue. Eyes: Denies redness, blurred vision, diplopia, discharge, itchy or watery eyes.  ENT: Denies discharge, congestion, post nasal drip, epistaxis, sore throat, earache, hearing loss, dental pain,  Tinnitus, Vertigo, Sinus pain or snoring.  Cardio: Denies chest pain, palpitations, irregular heartbeat, syncope, dyspnea, diaphoresis, orthopnea, PND, claudication or edema Respiratory: denies cough, dyspnea, DOE, pleurisy, hoarseness, laryngitis or wheezing.  Gastrointestinal: Denies dysphagia, heartburn, reflux, water brash, pain, cramps, nausea, vomiting, bloating, diarrhea, constipation, hematemesis, melena, hematochezia, jaundice or hemorrhoids Genitourinary: Denies dysuria, frequency, urgency, nocturia, hesitancy, discharge, hematuria or flank pain Musculoskeletal: Denies arthralgia, myalgia, stiffness, Jt. Swelling, pain, limp or strain/sprain. Denies Falls. Skin: Denies puritis, rash, hives, warts, acne, eczema or change in skin lesion Neuro: No weakness, tremor, incoordination, spasms, paresthesia or pain Psychiatric: Denies confusion, memory loss or sensory loss. Denies Depression. Endocrine: Denies change in weight, skin, hair change, nocturia, and paresthesia, diabetic polys, visual blurring or hyper / hypo glycemic episodes.  Heme/Lymph: No excessive bleeding, bruising or enlarged lymph nodes.   Physical Exam  BP 130/80   Pulse 75   Temp 97.6 F (36.4 C)   Resp 16   Ht '6\' 1"'$  (1.854 m)   Wt 274 lb 9.6 oz (124.6 kg)   SpO2 99%   BMI 36.23 kg/m   General Appearance: Well nourished and well groomed and in no apparent distress.  Eyes: PERRLA, EOMs, conjunctiva no swelling or erythema, normal fundi and vessels. Sinuses: No frontal/maxillary tenderness ENT/Mouth: EACs patent / TMs  nl. Nares clear without erythema, swelling, mucoid exudates. Oral hygiene is good. No erythema, swelling, or exudate. Tongue normal, non-obstructing. Tonsils not swollen or erythematous. Hearing normal.  Neck: Supple, thyroid not palpable. No bruits, nodes or JVD. Respiratory: Respiratory effort normal.  BS equal and clear bilateral without rales, rhonci, wheezing or stridor. Cardio: Heart sounds  are normal with regular rate and rhythm and no murmurs, rubs or gallops. Peripheral pulses are normal and equal bilaterally without edema. No aortic or femoral bruits. Chest: symmetric with normal excursions and percussion.  Abdomen: Soft, with Nl bowel sounds. Nontender, no guarding, rebound, hernias, masses, or organomegaly.  Lymphatics: Non tender without lymphadenopathy.  Musculoskeletal: Full ROM all peripheral extremities, joint stability, 5/5 strength, and normal gait. Skin: Warm and dry without rashes, cyanosis, clubbing or  ecchymosis.  Neuro: Cranial nerves intact, reflexes equal bilaterally. Normal muscle tone, no cerebellar symptoms. Sensation intact.  Pysch: Alert and oriented X 3 with normal affect, insight and judgment appropriate.   Assessment and Plan  1. Annual Preventative/Screening Exam    2. Essential hypertension  - CBC with Differential/Platelet - COMPLETE METABOLIC PANEL WITH GFR - Magnesium - TSH - EKG 12-Lead - Urinalysis, Routine w reflex microscopic - Microalbumin / creatinine urine ratio  3. Right lower quadrant abdominal pain  - CT Abdomen Pelvis Wo Contrast; Future  4. Hyperlipidemia associated with type 2 diabetes mellitus (HCC)  - Lipid panel - TSH - EKG 12-Lead  5. Controlled type 2 diabetes mellitus with stage 1 chronic  kidney disease, without long-term current use of insulin (HCC)  - Hemoglobin A1c - Insulin, random - EKG 12-Lead  6. Vitamin D deficiency  - VITAMIN D 25 Hydroxy   7. Testosterone deficiency  - Testosterone  8. Trigeminal neuralgia of right side of face   9. Prostate cancer screening  - PSA  10. Screening-pulmonary TB  - TB Skin Test  11. Screening for colorectal cancer  - POC Hemoccult Bld/Stl - [place], RETROPERITNL ABD,  LTD  12. Screening for heart disease  - EKG 12-Lead  13. FH: hypertension  - EKG  12-Lead - [place], RETROPERITNL ABD,  LTD  14. Former smoker  - EKG 12-Lead - [place], RETROPERITNL  ABD,  LTD  15. Screening for AAA (aortic abdominal aneurysm)  - [place], RETROPERITNL ABD,  LTD  16. Fatigue, unspecified type  - Testosterone - Iron, Total/Total Iron Binding Cap - Vitamin B12  17. Need for immunization against influenza  - Flu Vaccine QUAD 6+ mos PF IM (Fluarix Quad PF)  18. Medication management  - CBC with Differential/Platelet - COMPLETE METABOLIC PANEL WITH GFR - Magnesium - Lipid panel - TSH - Hemoglobin A1c - Insulin, random - VITAMIN D 25 Hydroxy  - Urinalysis, Routine w reflex microscopic - Microalbumin / creatinine urine ratio           Patient was counseled in prudent diet, weight control to achieve/maintain BMI less than 25, BP monitoring, regular exercise and medications as discussed.  Discussed med effects and SE's. Routine screening labs and tests as requested with regular follow-up as recommended. Over 40 minutes of exam, counseling, chart review and high complex critical decision making was performed   Kirtland Bouchard, MD

## 2022-07-07 NOTE — Progress Notes (Incomplete)
Annual  Screening/Preventative Visit  & Comprehensive Evaluation & Examination  Future Appointments  Date Time Provider Department  07/08/2022                       cpe 11:00 AM [name], MD [facility]  04/06/2023  8:50 AM Pieter Partridge, DO [facility]  07/14/2023                       cpe 11:00 AM [name], MD [facility]              This very nice 56 y.o. MWM  with HTN, HLD, diet controlled  DM and Vitamin D Deficiency  presents for a Screening /Preventative Visit & comprehensive evaluation and management of multiple medical co-morbidities.   Patient has OSA on CPAP with improved Restorative Sleep. Patient is followed by Dr Tomi Likens for chronic Trigeminal Neuralgia.        HTN predates circa 2002. Patient's BP has been controlled at home.  Today's BP is at goal -114/72. Patient denies any cardiac symptoms as chest pain, palpitations, shortness of breath, dizziness or ankle swelling.       Patient's hyperlipidemia is controlled with diet and  Rosuvastatin. Patient denies myalgias or other medication SE's. Last lipids were at goal:  Lab Results  Component Value Date   CHOL 103 12/29/2021   HDL 50 12/29/2021   LDLCALC 38 12/29/2021   TRIG 66 12/29/2021   CHOLHDL 2.1 12/29/2021           Patient has Morbid Obesity (BMI 35+) and consequent PreDiabetes  (A1c 6.3% /2008) and then diet controlled diabetes. Patient denies reactive hypoglycemic symptoms, visual blurring, diabetic polys or paresthesias. Last A1c was not at goal:   Lab Results  Component Value Date   HGBA1C 6.2 (H) 12/29/2021         Patient has been treated in the past with parenteral replacement  and has been on  Testosteronewith improved stamina & sense of well being, but apparently stopped it 2 years ago .         Finally, patient has history of Vitamin D Deficiency ("31" /2008) and last vitamin D was at goal:   Lab Results  Component Value Date   VD25OH 47 12/29/2021          Current  Outpatient Medications on File Prior to Visit  Medication Sig  . VITAMIN C 500 MG CAPS Take 1 capsule daily.  Marland Kitchen aspirin 81 MG Chew 1 tablet  daily.  Marland Kitchen VITAMIN D 10,000 Units   Take daily.   Marland Kitchen diltiazem 120 MG tablet Take  1 tablet  Daily  for BP  . doxycycline 100 MG capsule Take  1 capsule  2 x /day  with meals for  skin Infection  . FLONASE   nasal spray USE ONE SPRAY EACH NOSTRIL TWICE DAILY  . furosemide 40 MG tablet Take  1 tablet  Daily  for Fluid Retention   . Lecithin 1200 MG CAPS Take by mouth daily.  Marland Kitchen lisinopril  40 MG tablet Take 1 tablet once daily for blood pressure  . Magnesium 250 MG TABS Take 500 mg times a day.   . meloxicam (15 MG tablet Take  1/2 to 1 tablet  Daily  with Food   . Multiple Vitamins-Minerals  Take  daily.  . Omega-3  FISH OIL 1000 MG  Take two tablets 2 x /day   .  oxcarbazepine (TRILEPTAL) 600 MG Take 1 tablet 2  times daily.  . rosuvastatin  10 MG tablet TAKE 1 TABLET  ONCE DAILY   . Semaglutide,0.25 or 0.'5MG'$ /DOS 2 MG/1.5ML  Inject 0.5 mg  weekly into skin  . sildenafil (VIAGRA) 100 MG tablet 1/2-1 pill as needed 1 hour before XXXX  . valACYclovir (VALTREX) 1000 MG tablet Take 1 tablet Daily to Prevent Fever Blisters  . zinc 50 MG tablet Take 50 mg by mouth daily.    Allergies  Allergen Reactions  . Toprol Xl [Metoprolol Tartrate]     ED     Past Medical History:  Diagnosis Date  . Allergy   . Arthritis   . Elevated hemoglobin A1c   . Elevated LDL cholesterol level   . Hx of colonic polyp 11/20/2014  . Hypertension   . Hypogonadism male   . Neuromuscular disorder (HCC)    Trigeminar neuralgia  . Sleep apnea    wears CPAP  . Vitamin D deficiency      Health Maintenance  Topic Date Due  . COVID-19 Vaccine (3 - Pfizer risk series) 02/25/2020  . INFLUENZA VACCINE  04/20/2021  . Zoster Vaccines- Shingrix (2 of 2) 07/23/2021  . TETANUS/TDAP  02/02/2026  . Hepatitis C Screening  Completed  . HIV Screening  Completed  . HPV VACCINES   Aged Out     Immunization History  Administered Date(s) Administered  . Influenza Inj Mdck Quad  06/02/2017, 06/28/2018, 07/23/2019  . Influenza  07/10/2014  . Influenza 07/19/2016  . PFIZER SARS-COV-2 Vacc 01/03/2020, 01/28/2020  . PPD Test 04/18/2019, 05/23/2020  . Pneumococcal - 23 09/20/2009  . Td 09/20/2005  . Tdap 02/03/2016   Colon - 01/30/2018 - Dr Carlean Purl - polyps  Last Colon  - 07/31/2021 - Dr Carlean Purl - Polyps -Recc 5 yr  f/u due Nov 2027   Past Surgical History:  Procedure Laterality Date  . c5-6 fusion     2012  . COLONOSCOPY    . HIP FRACTURE SURGERY Right 1986   avulsion/chip fracture/ stress fracture     Family History  Problem Relation Age of Onset  . Colon cancer Maternal Uncle   . Prostate cancer Maternal Uncle   . Colon cancer Paternal Uncle   . Colon cancer Maternal Grandfather   . Colon cancer Paternal Grandfather   . Prostate cancer Father   . Stomach cancer Neg Hx   . Esophageal cancer Neg Hx     Social History   Socioeconomic History  . Marital status: Married    Spouse name: Not on file  . Number of children: 0  . Highest education level: Some college,  Occupational History    Employer: [employer]  Tobacco Use  . Smoking status: Former    Types: Cigarettes    Quit date: 09/20/1989    Years since quitting: 31.7  . Smokeless tobacco: Never  Vaping Use  . Vaping Use: Never used  Substance and Sexual Activity  . Alcohol use: No    Alcohol/week: 0.0 standard drinks  . Drug use: No  . Sexual activity: Yes    Partners: Female     ROS Constitutional: Denies fever, chills, weight loss/gain, headaches, insomnia,  night sweats or change in appetite. Does c/o fatigue. Eyes: Denies redness, blurred vision, diplopia, discharge, itchy or watery eyes.  ENT: Denies discharge, congestion, post nasal drip, epistaxis, sore throat, earache, hearing loss, dental pain, Tinnitus, Vertigo, Sinus pain or snoring.  Cardio: Denies chest  pain,  palpitations, irregular heartbeat, syncope, dyspnea, diaphoresis, orthopnea, PND, claudication or edema Respiratory: denies cough, dyspnea, DOE, pleurisy, hoarseness, laryngitis or wheezing.  Gastrointestinal: Denies dysphagia, heartburn, reflux, water brash, pain, cramps, nausea, vomiting, bloating, diarrhea, constipation, hematemesis, melena, hematochezia, jaundice or hemorrhoids Genitourinary: Denies dysuria, frequency, urgency, nocturia, hesitancy, discharge, hematuria or flank pain Musculoskeletal: Denies arthralgia, myalgia, stiffness, Jt. Swelling, pain, limp or strain/sprain. Denies Falls. Skin: Denies puritis, rash, hives, warts, acne, eczema or change in skin lesion Neuro: No weakness, tremor, incoordination, spasms, paresthesia or pain Psychiatric: Denies confusion, memory loss or sensory loss. Denies Depression. Endocrine: Denies change in weight, skin, hair change, nocturia, and paresthesia, diabetic polys, visual blurring or hyper / hypo glycemic episodes.  Heme/Lymph: No excessive bleeding, bruising or enlarged lymph nodes.   Physical Exam  There were no vitals taken for this visit.  General Appearance: Well nourished and well groomed and in no apparent distress.  Eyes: PERRLA, EOMs, conjunctiva no swelling or erythema, normal fundi and vessels. Sinuses: No frontal/maxillary tenderness ENT/Mouth: EACs patent / TMs  nl. Nares clear without erythema, swelling, mucoid exudates. Oral hygiene is good. No erythema, swelling, or exudate. Tongue normal, non-obstructing. Tonsils not swollen or erythematous. Hearing normal.  Neck: Supple, thyroid not palpable. No bruits, nodes or JVD. Respiratory: Respiratory effort normal.  BS equal and clear bilateral without rales, rhonci, wheezing or stridor. Cardio: Heart sounds are normal with regular rate and rhythm and no murmurs, rubs or gallops. Peripheral pulses are normal and equal bilaterally without edema. No aortic or femoral  bruits. Chest: symmetric with normal excursions and percussion.  Abdomen: Soft, with Nl bowel sounds. Nontender, no guarding, rebound, hernias, masses, or organomegaly.  Lymphatics: Non tender without lymphadenopathy.  Musculoskeletal: Full ROM all peripheral extremities, joint stability, 5/5 strength, and normal gait. Skin: Warm and dry without rashes, cyanosis, clubbing or  ecchymosis.  Neuro: Cranial nerves intact, reflexes equal bilaterally. Normal muscle tone, no cerebellar symptoms. Sensation intact.  Pysch: Alert and oriented X 3 with normal affect, insight and judgment appropriate.   Assessment and Plan  1. Annual Preventative/Screening Exam          Patient was counseled in prudent diet, weight control to achieve/maintain BMI less than 25, BP monitoring, regular exercise and medications as discussed.  Discussed med effects and SE's. Routine screening labs and tests as requested with regular follow-up as recommended. Over 40 minutes of exam, counseling, chart review and high complex critical decision making was performed   Kirtland Bouchard, MD

## 2022-07-08 ENCOUNTER — Encounter: Payer: Self-pay | Admitting: Internal Medicine

## 2022-07-08 ENCOUNTER — Ambulatory Visit (INDEPENDENT_AMBULATORY_CARE_PROVIDER_SITE_OTHER): Payer: BC Managed Care – PPO | Admitting: Internal Medicine

## 2022-07-08 VITALS — BP 130/80 | HR 75 | Temp 97.6°F | Resp 16 | Ht 73.0 in | Wt 274.6 lb

## 2022-07-08 DIAGNOSIS — I10 Essential (primary) hypertension: Secondary | ICD-10-CM | POA: Diagnosis not present

## 2022-07-08 DIAGNOSIS — N401 Enlarged prostate with lower urinary tract symptoms: Secondary | ICD-10-CM

## 2022-07-08 DIAGNOSIS — Z130 Encounter for screening for diseases of the blood and blood-forming organs and certain disorders involving the immune mechanism: Secondary | ICD-10-CM

## 2022-07-08 DIAGNOSIS — Z1389 Encounter for screening for other disorder: Secondary | ICD-10-CM | POA: Diagnosis not present

## 2022-07-08 DIAGNOSIS — Z8249 Family history of ischemic heart disease and other diseases of the circulatory system: Secondary | ICD-10-CM

## 2022-07-08 DIAGNOSIS — Z23 Encounter for immunization: Secondary | ICD-10-CM | POA: Diagnosis not present

## 2022-07-08 DIAGNOSIS — R1031 Right lower quadrant pain: Secondary | ICD-10-CM

## 2022-07-08 DIAGNOSIS — Z131 Encounter for screening for diabetes mellitus: Secondary | ICD-10-CM | POA: Diagnosis not present

## 2022-07-08 DIAGNOSIS — Z111 Encounter for screening for respiratory tuberculosis: Secondary | ICD-10-CM | POA: Diagnosis not present

## 2022-07-08 DIAGNOSIS — Z136 Encounter for screening for cardiovascular disorders: Secondary | ICD-10-CM

## 2022-07-08 DIAGNOSIS — Z0001 Encounter for general adult medical examination with abnormal findings: Secondary | ICD-10-CM

## 2022-07-08 DIAGNOSIS — Z79899 Other long term (current) drug therapy: Secondary | ICD-10-CM

## 2022-07-08 DIAGNOSIS — E1169 Type 2 diabetes mellitus with other specified complication: Secondary | ICD-10-CM

## 2022-07-08 DIAGNOSIS — Z13220 Encounter for screening for lipoid disorders: Secondary | ICD-10-CM | POA: Diagnosis not present

## 2022-07-08 DIAGNOSIS — Z87891 Personal history of nicotine dependence: Secondary | ICD-10-CM | POA: Diagnosis not present

## 2022-07-08 DIAGNOSIS — E559 Vitamin D deficiency, unspecified: Secondary | ICD-10-CM | POA: Diagnosis not present

## 2022-07-08 DIAGNOSIS — I700 Atherosclerosis of aorta: Secondary | ICD-10-CM

## 2022-07-08 DIAGNOSIS — Z1329 Encounter for screening for other suspected endocrine disorder: Secondary | ICD-10-CM | POA: Diagnosis not present

## 2022-07-08 DIAGNOSIS — Z0000 Encounter for general adult medical examination without abnormal findings: Secondary | ICD-10-CM

## 2022-07-08 DIAGNOSIS — Z1211 Encounter for screening for malignant neoplasm of colon: Secondary | ICD-10-CM

## 2022-07-08 DIAGNOSIS — E349 Endocrine disorder, unspecified: Secondary | ICD-10-CM

## 2022-07-08 DIAGNOSIS — Z125 Encounter for screening for malignant neoplasm of prostate: Secondary | ICD-10-CM | POA: Diagnosis not present

## 2022-07-08 DIAGNOSIS — R5383 Other fatigue: Secondary | ICD-10-CM

## 2022-07-08 DIAGNOSIS — G500 Trigeminal neuralgia: Secondary | ICD-10-CM

## 2022-07-08 DIAGNOSIS — E1122 Type 2 diabetes mellitus with diabetic chronic kidney disease: Secondary | ICD-10-CM

## 2022-07-08 DIAGNOSIS — R350 Frequency of micturition: Secondary | ICD-10-CM | POA: Diagnosis not present

## 2022-07-09 ENCOUNTER — Ambulatory Visit (HOSPITAL_COMMUNITY)
Admission: RE | Admit: 2022-07-09 | Discharge: 2022-07-09 | Disposition: A | Discharge status: Home / Self Care | Payer: BC Managed Care – PPO | Source: Ambulatory Visit | Attending: Internal Medicine | Admitting: Internal Medicine

## 2022-07-09 ENCOUNTER — Other Ambulatory Visit: Payer: Self-pay | Admitting: Internal Medicine

## 2022-07-09 DIAGNOSIS — R1031 Right lower quadrant pain: Secondary | ICD-10-CM | POA: Insufficient documentation

## 2022-07-09 LAB — CBC WITH DIFFERENTIAL/PLATELET
Absolute Monocytes: 625 cells/uL (ref 200–950)
Basophils Absolute: 42 cells/uL (ref 0–200)
Basophils Relative: 0.8 %
Eosinophils Absolute: 260 cells/uL (ref 15–500)
Eosinophils Relative: 4.9 %
HCT: 37.8 % — ABNORMAL LOW (ref 38.5–50.0)
Hemoglobin: 12 g/dL — ABNORMAL LOW (ref 13.2–17.1)
Lymphs Abs: 1267 cells/uL (ref 850–3900)
MCH: 25.6 pg — ABNORMAL LOW (ref 27.0–33.0)
MCHC: 31.7 g/dL — ABNORMAL LOW (ref 32.0–36.0)
MCV: 80.6 fL (ref 80.0–100.0)
MPV: 9.6 fL (ref 7.5–12.5)
Monocytes Relative: 11.8 %
Neutro Abs: 3106 cells/uL (ref 1500–7800)
Neutrophils Relative %: 58.6 %
Platelets: 311 10*3/uL (ref 140–400)
RBC: 4.69 10*6/uL (ref 4.20–5.80)
RDW: 16.3 % — ABNORMAL HIGH (ref 11.0–15.0)
Total Lymphocyte: 23.9 %
WBC: 5.3 10*3/uL (ref 3.8–10.8)

## 2022-07-09 LAB — LIPID PANEL
Cholesterol: 116 mg/dL (ref <200)
HDL: 42 mg/dL (ref >=40)
LDL Cholesterol (Calc): 52 mg/dL (calc)
Non-HDL Cholesterol (Calc): 74 mg/dL (calc) (ref <130)
Total CHOL/HDL Ratio: 2.8 (calc) (ref <5.0)
Triglycerides: 137 mg/dL (ref <150)

## 2022-07-09 LAB — COMPLETE METABOLIC PANEL WITH GFR
AG Ratio: 1.5 (calc) (ref 1.0–2.5)
ALT: 19 U/L (ref 9–46)
AST: 26 U/L (ref 10–35)
Albumin: 4.2 g/dL (ref 3.6–5.1)
Alkaline phosphatase (APISO): 64 U/L (ref 35–144)
BUN: 12 mg/dL (ref 7–25)
CO2: 29 mmol/L (ref 20–32)
Calcium: 9.3 mg/dL (ref 8.6–10.3)
Chloride: 101 mmol/L (ref 98–110)
Creat: 1.16 mg/dL (ref 0.70–1.30)
Globulin: 2.8 g/dL (calc) (ref 1.9–3.7)
Glucose, Bld: 159 mg/dL — ABNORMAL HIGH (ref 65–99)
Potassium: 4.1 mmol/L (ref 3.5–5.3)
Sodium: 138 mmol/L (ref 135–146)
Total Bilirubin: 0.4 mg/dL (ref 0.2–1.2)
Total Protein: 7 g/dL (ref 6.1–8.1)
eGFR: 73 mL/min/{1.73_m2} (ref >=60)

## 2022-07-09 LAB — HEMOGLOBIN A1C
Hgb A1c MFr Bld: 6.4 % of total Hgb — ABNORMAL HIGH (ref <5.7)
Mean Plasma Glucose: 137 mg/dL
eAG (mmol/L): 7.6 mmol/L

## 2022-07-09 LAB — PSA: PSA: 0.59 ng/mL (ref <=4.00)

## 2022-07-09 LAB — IRON, TOTAL/TOTAL IRON BINDING CAP
%SAT: 11 % (calc) — ABNORMAL LOW (ref 20–48)
Iron: 42 ug/dL — ABNORMAL LOW (ref 50–180)
TIBC: 399 mcg/dL (calc) (ref 250–425)

## 2022-07-09 LAB — URINALYSIS, ROUTINE W REFLEX MICROSCOPIC
Bilirubin Urine: NEGATIVE
Glucose, UA: NEGATIVE
Hgb urine dipstick: NEGATIVE
Ketones, ur: NEGATIVE
Leukocytes,Ua: NEGATIVE
Nitrite: NEGATIVE
Protein, ur: NEGATIVE
Specific Gravity, Urine: 1.022 (ref 1.001–1.035)
pH: 6.5 (ref 5.0–8.0)

## 2022-07-09 LAB — MICROALBUMIN / CREATININE URINE RATIO
Creatinine, Urine: 210 mg/dL (ref 20–320)
Microalb Creat Ratio: 4 mcg/mg creat (ref <30)
Microalb, Ur: 0.8 mg/dL

## 2022-07-09 LAB — TESTOSTERONE: Testosterone: 642 ng/dL (ref 250–827)

## 2022-07-09 LAB — VITAMIN B12: Vitamin B-12: 845 pg/mL (ref 200–1100)

## 2022-07-09 LAB — MAGNESIUM: Magnesium: 2.2 mg/dL (ref 1.5–2.5)

## 2022-07-09 LAB — TSH: TSH: 1.69 mIU/L (ref 0.40–4.50)

## 2022-07-09 LAB — VITAMIN D 25 HYDROXY (VIT D DEFICIENCY, FRACTURES): Vit D, 25-Hydroxy: 77 ng/mL (ref 30–100)

## 2022-07-09 LAB — INSULIN, RANDOM: Insulin: 370.3 u[IU]/mL — ABNORMAL HIGH

## 2022-07-09 MED ORDER — IOHEXOL 9 MG/ML PO SOLN
ORAL | Status: AC
  Filled 2022-07-09: qty 1000

## 2022-07-09 MED ORDER — IOHEXOL 9 MG/ML PO SOLN
1000.0000 mL | ORAL | Status: AC
  Administered 2022-07-09: 1000 mL via ORAL

## 2022-07-09 NOTE — Progress Notes (Signed)
<><><><><><><><><><><><><><><><><><><><><><><><><><><><><><><><><> <><><><><><><><><><><><><><><><><><><><><><><><><><><><><><><><><> -   Test results slightly outside the reference range are not unusual. If there is anything important, I will review this with you,  otherwise it is considered normal test values.  If you have further questions,  please do not hesitate to contact me at the office or via My Chart.  <><><><><><><><><><><><><><><><><><><><><><><><><><><><><><><><><> <><><><><><><><><><><><><><><><><><><><><><><><><><><><><><><><><>  -  Glucose was 159 MG%  - is too high                     ( Ideal or Goal is less than 120 mg% ) & A1c = 6.4% Also too high & essentially is in the Diabetic Range                     (  Ideal or Goal is less than 5.7% )  - Also Insulin level is very elevated at 370.3  ( Normal is less than 20  !  )  Which  shows insulin resistance - a sign of early diabetes and associated with   a 300 % greater risk for heart attacks, strokes, cancer & Alzheimer type vascular dementia   - All this can be cured  and prevented with losing weight  !  - get Dr Fara Olden Fuhrman's book 'the End of Diabetes" and "the End of Dieting"  and   learn how to eat healthy to lose weight & cure your Diabetes  &    add many years of good health to your life.  <><><><><><><><><><><><><><><><><><><><><><><><><><><><><><><><><> <><><><><><><><><><><><><><><><><><><><><><><><><><><><><><><><><>  - Vitamin B12  level is great , But  - Iron levels are very Low - Recommend take an OTC Iron supplement  and    - Eat more Veggies with Iron as Carrots, Beets , all leafy green veggies as   Spinach, Collards,  Turnip - Mustard or Mixed Greens, Kale, Asparagus,   Broccoli, Brussel Sprouts, Green Beans / peas, Soybeans, Lentils, Sweet Potatoes <><><><><><><><><><><><><><><><><><><><><><><><><><><><><><><><><> <><><><><><><><><><><><><><><><><><><><><><><><><><><><><><><><><>  - Vitamin  D = 77 - Great  !    Please continue same dose  <><><><><><><><><><><><><><><><><><><><><><><><><><><><><><><><><>  - Testosterone level is Normal  <><><><><><><><><><><><><><><><><><><><><><><><><><><><><><><><><>  - PSA - Low - Great !  No Prostate Cancer  <><><><><><><><><><><><><><><><><><><><><><><><><><><><><><><><><>  - It's 6 pm Fri evening & CT scan report is still not reported out <><><><><><><><><><><><><><><><><><><><><><><><><><><><><><><><><> <><><><><><><><><><><><><><><><><><><><><><><><><><><><><><><><><>

## 2022-07-10 ENCOUNTER — Encounter: Payer: Self-pay | Admitting: Internal Medicine

## 2022-07-11 ENCOUNTER — Other Ambulatory Visit: Payer: Self-pay | Admitting: Internal Medicine

## 2022-07-11 DIAGNOSIS — K5792 Diverticulitis of intestine, part unspecified, without perforation or abscess without bleeding: Secondary | ICD-10-CM

## 2022-07-11 MED ORDER — METRONIDAZOLE 500 MG PO TABS: ORAL_TABLET | ORAL | 1 refills | Status: DC

## 2022-07-11 MED ORDER — CIPROFLOXACIN HCL 500 MG PO TABS: ORAL_TABLET | ORAL | 1 refills | Status: DC

## 2022-07-11 NOTE — Progress Notes (Signed)
<><><><><><><><><><><><><><><><><><><><><><><><><><><><><><><><><> <><><><><><><><><><><><><><><><><><><><><><><><><><><><><><><><><>  -   Abdominal CT scan show diverticulitis of the Large Intestine or Colon .   So I sent in 2 Rx's  for 10 days for 2 different Antibiotics refills)     Diverticulitis  Diverticulitis is infection or inflammation of small pouches (diverticula) in the colon that form due to a condition called diverticulosis. Diverticula can trap stool (feces) and bacteria, causing infection and inflammation. Diverticulitis may cause severe stomach pain and diarrhea. It may lead to tissue damage in the colon that causes bleeding or blockage. The diverticula may also burst (rupture) and cause infected stool to enter other areas of the abdomen. What are the causes? This condition is caused by stool becoming trapped in the diverticula, which allows bacteria to grow in the diverticula. This leads to inflammation and infection. What increases the risk? You are more likely to develop this condition if you have diverticulosis. The risk increases if you:  Are overweight or obese.  Do not get enough exercise.  Drink alcohol.  Use tobacco products.  Eat a diet that has a lot of red meat such as beef, pork, or lamb.  Eat a diet that does not include enough fiber. High-fiber foods include fruits, vegetables, beans, nuts, and whole grains.  Are over 57 years of age. What are the signs or symptoms? Symptoms of this condition may include:  Pain and tenderness in the abdomen. The pain is normally located on the left side of the abdomen, but it may occur in other areas.  Fever and chills.  Nausea.  Vomiting.  Cramping.  Bloating.  Changes in bowel routines.  Blood in your stool. How is this diagnosed? This condition is diagnosed based on:  Your medical history.  A physical exam.  Tests to make sure there is nothing else causing your condition. These tests may  include:  o Blood tests.  o Urine tests.  o CT scan of the abdomen. How is this treated? Most cases of this condition are mild and can be treated at home. Treatment may include:  Taking over-the-counter pain medicines.  Following a clear liquid diet.  Taking antibiotic medicines by mouth.  Resting. More severe cases may need to be treated at a hospital. Treatment may include:  Not eating or drinking.  Taking prescription pain medicine.  Receiving antibiotic medicines through an IV.  Receiving fluids and nutrition through an IV.  Surgery. When your condition is under control, your health care provider may recommend that you have a colonoscopy. This is an exam to look at the entire large intestine. During the exam, a lubricated, bendable tube is inserted into the anus and then passed into the rectum, colon, and other parts of the large intestine. A colonoscopy can show how severe your diverticula are and whether something else may be causing your symptoms. Follow these instructions at home: Medicines  Take over-the-counter and prescription medicines only as told by your health care provider. These include fiber supplements, probiotics, and stool softeners.  If you were prescribed an antibiotic medicine, take it as told by your health care provider. Do not stop taking the antibiotic even if you start to feel better.  Ask your health care provider if the medicine prescribed to you requires you to avoid driving or using machinery. Eating and drinking   Follow a full liquid diet or another diet as directed by your health care provider.  After your symptoms improve, your health care provider may tell you to change your diet. He  or she may recommend that you eat a diet that contains at least 25 grams (25 g) of fiber daily. Fiber makes it easier to pass stool. Healthy sources of fiber include:  o Berries. One cup contains 4-8 grams of fiber.  o Beans or lentils. One-half cup contains  5-8 grams of fiber.  o Green vegetables. One cup contains 4 grams of fiber.  Avoid eating red meat. General instructions  Do not use any products that contain nicotine or tobacco, such as cigarettes, e-cigarettes, and chewing tobacco. If you need help quitting, ask your health care provider.  Exercise for at least 30 minutes, 3 times each week. You should exercise hard enough to raise your heart rate and break a sweat.  Keep all follow-up visits as told by your health care provider. This is important. You may need to have a colonoscopy. Contact a health care provider if:  Your pain does not improve.  Your bowel movements do not return to normal. Get help right away if:  Your pain gets worse.  Your symptoms do not get better with treatment.  Your symptoms suddenly get worse.  You have a fever.  You vomit more than one time.  You have stools that are bloody, black, or tarry. Summary  Diverticulitis is infection or inflammation of small pouches (diverticula) in the colon that form due to a condition called diverticulosis. Diverticula can trap stool (feces) and bacteria, causing infection and inflammation.  You are at higher risk for this condition if you have diverticulosis and you eat a diet that does not include enough fiber.  Most cases of this condition are mild and can be treated at home. More severe cases may need to be treated at a hospital.  When your condition is under control, your health care provider may recommend that you have an exam called a colonoscopy. This exam can show how severe your diverticula are and whether something else may be causing your symptoms.  <><><><><><><><><><><><><><><><><><><><><><><><><><><><><><><><><> <><><><><><><><><><><><><><><><><><><><><><><><><><><><><><><><><>  -  Nothing bad showed up like a cancer , But it did show a " Fatty Liver " and   The only treatment for that is Weight Loss !   Fatty Liver Disease  The liver  converts food into energy, removes toxic material from the blood, makes important proteins, and absorbs necessary vitamins from food. Fatty liver disease occurs when too much fat has built up in your liver cells. Fatty liver disease is also called hepatic steatosis. In many cases, fatty liver disease does not cause symptoms or problems. It is often diagnosed when tests are being done for other reasons. However, over time, fatty liver can cause inflammation that may lead to more serious liver problems, such as scarring of the liver (cirrhosis) and liver failure. Fatty liver is associated with insulin resistance, increased body fat, high blood pressure (hypertension), and high cholesterol. These are features of metabolic syndrome and increase your risk for stroke, diabetes, and heart disease. What are the causes? This condition may be caused by components of metabolic syndrome:  Obesity.  Insulin resistance.  High cholesterol. Other causes:  Alcohol abuse.  Poor nutrition.  Cushing syndrome.  Pregnancy.  Certain drugs.  Poisons.  Some viral infections. What increases the risk? You are more likely to develop this condition if you:  Abuse alcohol.  Are overweight.  Have diabetes.  Have hepatitis.  Have a high triglyceride level.  Are pregnant. What are the signs or symptoms? Fatty liver disease often does not cause symptoms. If  symptoms do develop, they can include:  Fatigue and weakness.  Weight loss.  Confusion.  Nausea, vomiting, or abdominal pain.  Yellowing of your skin and the white parts of your eyes (jaundice).  Itchy skin. How is this diagnosed? This condition may be diagnosed by:  A physical exam and your medical history.  Blood tests.  Imaging tests, such as an ultrasound, CT scan, or MRI.  A liver biopsy. A small sample of liver tissue is removed using a needle. The sample is then looked at under a microscope. How is this treated? Fatty liver  disease is often caused by other health conditions. Treatment for fatty liver may involve medicines and lifestyle changes to manage conditions such as:  Alcoholism.  High cholesterol.  Diabetes.  Being overweight or obese. Follow these instructions at home:   Do not drink alcohol. If you have trouble quitting, ask your health care provider how to safely quit with the help of medicine or a supervised program. This is important to keep your condition from getting worse.  Eat a healthy diet as told by your health care provider. Ask your health care provider about working with a dietitian to develop an eating plan.  Exercise regularly. This can help you lose weight and control your cholesterol and diabetes. Talk to your health care provider about an exercise plan and which activities are best for you.  Take over-the-counter and prescription medicines only as told by your health care provider.  Keep all follow-up visits. This is important. Contact a health care provider if:  You have trouble controlling your:  o Blood sugar. This is especially important if you have diabetes.  o Cholesterol.  o Drinking of alcohol. Get help right away if:  You have abdominal pain.  You have jaundice.  You have nausea and are vomiting.  You vomit blood or material that looks like coffee grounds.  You have stools that are black, tar-like, or bloody. Summary  Fatty liver disease develops when too much fat builds up in the cells of your liver.  Fatty liver disease often causes no symptoms or problems. However, over time, fatty liver can cause inflammation that may lead to more serious liver problems, such as scarring of the liver (cirrhosis).  You are more likely to develop this condition if you abuse alcohol, are pregnant, are overweight, have diabetes, have hepatitis, or have high triglyceride or cholesterol levels.  Contact your health care provider if you have trouble controlling your blood  sugar, cholesterol, or drinking of alcohol.

## 2022-09-01 ENCOUNTER — Other Ambulatory Visit: Payer: Self-pay

## 2022-09-01 DIAGNOSIS — E782 Mixed hyperlipidemia: Secondary | ICD-10-CM

## 2022-09-01 MED ORDER — ROSUVASTATIN CALCIUM 10 MG PO TABS: 10.0000 mg | ORAL_TABLET | Freq: Every day | ORAL | 1 refills | Status: DC

## 2022-10-11 ENCOUNTER — Ambulatory Visit (INDEPENDENT_AMBULATORY_CARE_PROVIDER_SITE_OTHER): Payer: BC Managed Care – PPO | Admitting: Nurse Practitioner

## 2022-10-11 ENCOUNTER — Encounter: Payer: Self-pay | Admitting: Nurse Practitioner

## 2022-10-11 VITALS — BP 122/72 | HR 72 | Temp 97.9°F | Ht 73.0 in | Wt 280.8 lb

## 2022-10-11 DIAGNOSIS — I10 Essential (primary) hypertension: Secondary | ICD-10-CM

## 2022-10-11 DIAGNOSIS — E785 Hyperlipidemia, unspecified: Secondary | ICD-10-CM

## 2022-10-11 DIAGNOSIS — E1169 Type 2 diabetes mellitus with other specified complication: Secondary | ICD-10-CM | POA: Diagnosis not present

## 2022-10-11 DIAGNOSIS — E559 Vitamin D deficiency, unspecified: Secondary | ICD-10-CM

## 2022-10-11 DIAGNOSIS — E1122 Type 2 diabetes mellitus with diabetic chronic kidney disease: Secondary | ICD-10-CM | POA: Diagnosis not present

## 2022-10-11 DIAGNOSIS — N181 Chronic kidney disease, stage 1: Secondary | ICD-10-CM

## 2022-10-11 DIAGNOSIS — K5792 Diverticulitis of intestine, part unspecified, without perforation or abscess without bleeding: Secondary | ICD-10-CM | POA: Diagnosis not present

## 2022-10-11 DIAGNOSIS — G4733 Obstructive sleep apnea (adult) (pediatric): Secondary | ICD-10-CM

## 2022-10-11 DIAGNOSIS — N182 Chronic kidney disease, stage 2 (mild): Secondary | ICD-10-CM

## 2022-10-11 DIAGNOSIS — Z79899 Other long term (current) drug therapy: Secondary | ICD-10-CM

## 2022-10-11 NOTE — Patient Instructions (Signed)

## 2022-10-11 NOTE — Progress Notes (Signed)
FOLLOW UP  Assessment & Plan  Primary hypertension Continue medication Discussed DASH (Dietary Approaches to Stop Hypertension) DASH diet is lower in sodium than a typical American diet. Cut back on foods that are high in saturated fat, cholesterol, and trans fats. Eat more whole-grain foods, fish, poultry, and nuts Remain active and exercise as tolerated daily.  Monitor BP at home-Call if greater than 130/80.  Check CMP/CBC   CKD Discussed how what you eat and drink can aide in kidney protection. Stay well hydrated. Avoid high salt foods. Avoid NSAIDS. Keep BP and BG well controlled.   Take medications as prescribed. Remain active and exercise as tolerated daily. Maintain weight.  Continue to monitor. Check CMP/GFR  Hyperlipidemia Discussed lifestyle modifications. Recommended diet heavy in fruits and veggies, omega 3's. Decrease consumption of animal meats, cheeses, and dairy products. Remain active and exercise as tolerated. Continue to monitor. Check lipids/TSH  Diverticulitis No recent flare Continue to monitor diet Continue to follow with GI, Dr. Wendall Stade UTD on Coloscopy   DMII Education: Reviewed 'ABCs' of diabetes management  Discussed goals to be met and/or maintained include A1C (<7) Blood pressure (<130/80) Cholesterol (LDL <70) Continue Eye Exam yearly  Continue Dental Exam Q6 mo Discussed dietary recommendations Discussed Physical Activity recommendations Foot exam UTD Check A1C  Vitamin D Deficiency Continue supplement Monitor levels  Medication management All medications discussed and reviewed in full. All questions and concerns regarding medications addressed.    OSA on CPAP Reports compliance Continue to monitor  Morbid obesity Continue [facility] via [facility] Discussed appropriate BMI Goal of losing 1 lb per month. Diet modification. Physical activity. Encouraged/praised to build confidence.   Trigeminal  Neuralgia Continue f/ut with Dr. Tomi Likens, Neurology  Orders Placed This Encounter  Procedures   CBC with Differential/Platelet   COMPLETE METABOLIC PANEL WITH GFR   Lipid panel   Hemoglobin A1c   VITAMIN D 25 Hydroxy (Vit-D Deficiency, Fractures)    Notify office for further evaluation and treatment, questions or concerns if any reported s/s fail to improve.   The patient was advised to call back or seek an in-person evaluation if any symptoms worsen or if the condition fails to improve as anticipated.   Further disposition pending results of labs. Discussed med's effects and SE's.    I discussed the assessment and treatment plan with the patient. The patient was provided an opportunity to ask questions and all were answered. The patient agreed with the plan and demonstrated an understanding of the instructions.  Discussed med's effects and SE's. Screening labs and tests as requested with regular follow-up as recommended.  I provided 25 minutes of face-to-face time during this encounter including counseling, chart review, and critical decision making was preformed.      Future Appointments  Date Time Provider [place]  10/11/2022  4:00 PM Darrol Jump, NP [facility] None  01/13/2023  4:00 PM [name], MD [facility] None  04/06/2023  8:50 AM Pieter Partridge, DO [facility] None  07/14/2023 11:00 AM [name], MD [facility] None    ----------------------------------------------------------------------------------------------------------------------   HPI 57 y.o. male  presents for 3 month follow up on hypertension, cholesterol, diabetes, weight and vitamin D deficiency.   Overall he reports feeling well.  He has no new concerns to present in clinic today.  He is also followed by a nutritionalist, recently referred by [facility].    BMI is Body mass index is 37.05 kg/m., he has been working on diet and exercise. Wt Readings from Last  3 Encounters:  10/11/22 280  lb 12.8 oz (127.4 kg)  07/08/22 274 lb 9.6 oz (124.6 kg)  04/05/22 264 lb 3.2 oz (119.8 kg)    His blood pressure has been controlled at home, today their BP is BP: 122/72  He does not workout. He denies chest pain, shortness of breath, dizziness.   He is on cholesterol medication Rosuvastatin and denies myalgias. His cholesterol is at goal. The cholesterol last visit was:   Lab Results  Component Value Date   CHOL 116 07/08/2022   HDL 42 07/08/2022   LDLCALC 52 07/08/2022   TRIG 137 07/08/2022   CHOLHDL 2.8 07/08/2022    He has been working on diet and exercise for prediabetes, and denies polydipsia and polyuria. Last A1C in the office was:  Lab Results  Component Value Date   HGBA1C 6.4 (H) 07/08/2022   Patient is on Vitamin D supplement.   Lab Results  Component Value Date   VD25OH 77 07/08/2022     HE has a hx of trigeminal neurologia onset age 26 yo.  He follows with Neurology every 6 months.  Denies any recent concerns.  He also has hx of sleep apnea.  Compliant with CPAP machine.  Reports well rested.   Has a hx of diverticulitis. Follows GI and UTD on Colonoscopy.  No recent flares.     Current Medications:  Current Outpatient Medications on File Prior to Visit  Medication Sig   Ascorbic Acid (VITAMIN C) 500 MG CAPS Take 1 capsule by mouth daily.   aspirin 81 MG chewable tablet Chew 1 tablet (81 mg total) by mouth daily.   Cholecalciferol (VITAMIN D PO) Take 10,000 Units by mouth daily.    ciprofloxacin (CIPRO) 500 MG tablet Take  1 tablet 2 x /day for Infection   fluticasone (FLONASE) 50 MCG/ACT nasal spray USE ONE SPRAY EACH NOSTRIL TWICE DAILY   Magnesium 250 MG TABS Take 500 mg by mouth 2 (two) times a day.    meloxicam (MOBIC) 15 MG tablet Take  1/2 to 1 tablet  Daily  with Food  for Pain /Inflammation & try limit to 5 days /week to Avoid Kidney Damage   metroNIDAZOLE (FLAGYL) 500 MG tablet Take  1 tablet  3 x /day  for Infection   Multiple Vitamins-Minerals  (MULTIVITAMIN MEN PO) Take by mouth daily.   Omega-3 Fatty Acids (FISH OIL) 1000 MG CAPS Take 1,200 mg by mouth. Take two tablets in the morning and two tablets in the evening   oxcarbazepine (TRILEPTAL) 600 MG tablet Take 1 tablet (600 mg total) by mouth 2 (two) times daily.   rosuvastatin (CRESTOR) 10 MG tablet Take 1 tablet (10 mg total) by mouth daily. for cholesterol.   Semaglutide, 2 MG/DOSE, (OZEMPIC, 2 MG/DOSE,) 8 MG/3ML SOPN Inject 2 mg into the Skin every 7 days for Diabetes  ( Dx:  e11.29 ) (Patient not taking: Reported on 07/08/2022)   sildenafil (VIAGRA) 100 MG tablet 1/2-1 pill as needed 1 hour before intercourse, can get with good RX from [employer]   torsemide (DEMADEX) 20 MG tablet Take  1 tablet  1  to 2 x /day for Edema   valACYclovir (VALTREX) 1000 MG tablet Take 1 tablet Daily to Prevent Fever Blisters (Patient taking differently: as needed. Take 1 tablet Daily to Prevent Fever Blisters)   zinc gluconate 50 MG tablet Take 50 mg by mouth daily.   No current facility-administered medications on file prior to visit.  Allergies:  Allergies  Allergen Reactions   Toprol Xl [Metoprolol Tartrate]     ED     Medical History:  Past Medical History:  Diagnosis Date   Allergy    Arthritis    Elevated hemoglobin A1c    Elevated LDL cholesterol level    Hx of colonic polyp 11/20/2014   Hyperlipidemia    Hypertension    Hypogonadism male    Neuromuscular disorder (HCC)    Trigeminar neuralgia   Sleep apnea    wears CPAP   Vitamin D deficiency    Family history- Reviewed and unchanged Social history- Reviewed and unchanged   Review of Systems: All review systems reviewed and negative except for pertinent positives in history of present illness.  Physical Exam: There were no vitals taken for this visit. Wt Readings from Last 3 Encounters:  07/08/22 274 lb 9.6 oz (124.6 kg)  04/05/22 264 lb 3.2 oz (119.8 kg)  03/31/22 264 lb 9.6 oz (120 kg)   General  Appearance: Well nourished, in no apparent distress. Eyes: PERRLA, EOMs, conjunctiva no swelling or erythema Sinuses: No Frontal/maxillary tenderness ENT/Mouth: Ext aud canals clear, TMs without erythema, bulging. No erythema, swelling, or exudate on post pharynx.  Tonsils not swollen or erythematous. Hearing normal.  Neck: Supple, thyroid normal.  Respiratory: Respiratory effort normal, BS equal bilaterally without rales, rhonchi, wheezing or stridor.  Cardio: RRR with no MRGs. Brisk peripheral pulses without edema.  Abdomen: Soft, + BS.  Non tender, no guarding, rebound, hernias, masses. Lymphatics: Non tender without lymphadenopathy.  Musculoskeletal: Full ROM, 5/5 strength, Normal gait Skin: Warm.  Appropriate color for ethnicity.  Neuro: Cranial nerves intact. No cerebellar symptoms.  Psych: Awake and oriented X 3, normal affect, Insight and Judgment appropriate.    Darrol Jump, NP 1:35 PM [facility]

## 2022-10-12 LAB — CBC WITH DIFFERENTIAL/PLATELET
Absolute Monocytes: 884 cells/uL (ref 200–950)
Basophils Absolute: 52 cells/uL (ref 0–200)
Basophils Relative: 0.8 %
Eosinophils Absolute: 273 cells/uL (ref 15–500)
Eosinophils Relative: 4.2 %
HCT: 41.5 % (ref 38.5–50.0)
Hemoglobin: 13.7 g/dL (ref 13.2–17.1)
Lymphs Abs: 1742 cells/uL (ref 850–3900)
MCH: 26.8 pg — ABNORMAL LOW (ref 27.0–33.0)
MCHC: 33 g/dL (ref 32.0–36.0)
MCV: 81.2 fL (ref 80.0–100.0)
MPV: 9.2 fL (ref 7.5–12.5)
Monocytes Relative: 13.6 %
Neutro Abs: 3549 cells/uL (ref 1500–7800)
Neutrophils Relative %: 54.6 %
Platelets: 304 10*3/uL (ref 140–400)
RBC: 5.11 10*6/uL (ref 4.20–5.80)
RDW: 15.6 % — ABNORMAL HIGH (ref 11.0–15.0)
Total Lymphocyte: 26.8 %
WBC: 6.5 10*3/uL (ref 3.8–10.8)

## 2022-10-12 LAB — LIPID PANEL
Cholesterol: 123 mg/dL (ref <200)
HDL: 45 mg/dL (ref >=40)
LDL Cholesterol (Calc): 57 mg/dL (calc)
Non-HDL Cholesterol (Calc): 78 mg/dL (calc) (ref <130)
Total CHOL/HDL Ratio: 2.7 (calc) (ref <5.0)
Triglycerides: 133 mg/dL (ref <150)

## 2022-10-12 LAB — COMPLETE METABOLIC PANEL WITH GFR
AG Ratio: 1.5 (calc) (ref 1.0–2.5)
ALT: 28 U/L (ref 9–46)
AST: 27 U/L (ref 10–35)
Albumin: 4.7 g/dL (ref 3.6–5.1)
Alkaline phosphatase (APISO): 75 U/L (ref 35–144)
BUN: 15 mg/dL (ref 7–25)
CO2: 29 mmol/L (ref 20–32)
Calcium: 10 mg/dL (ref 8.6–10.3)
Chloride: 100 mmol/L (ref 98–110)
Creat: 1.13 mg/dL (ref 0.70–1.30)
Globulin: 3.1 g/dL (calc) (ref 1.9–3.7)
Glucose, Bld: 115 mg/dL — ABNORMAL HIGH (ref 65–99)
Potassium: 4.3 mmol/L (ref 3.5–5.3)
Sodium: 139 mmol/L (ref 135–146)
Total Bilirubin: 0.2 mg/dL (ref 0.2–1.2)
Total Protein: 7.8 g/dL (ref 6.1–8.1)
eGFR: 75 mL/min/{1.73_m2} (ref >=60)

## 2022-10-12 LAB — HEMOGLOBIN A1C
Hgb A1c MFr Bld: 6.6 % of total Hgb — ABNORMAL HIGH (ref <5.7)
Mean Plasma Glucose: 143 mg/dL
eAG (mmol/L): 7.9 mmol/L

## 2022-10-12 LAB — VITAMIN D 25 HYDROXY (VIT D DEFICIENCY, FRACTURES): Vit D, 25-Hydroxy: 64 ng/mL (ref 30–100)

## 2022-11-15 ENCOUNTER — Other Ambulatory Visit: Payer: Self-pay | Admitting: Internal Medicine

## 2022-11-15 DIAGNOSIS — I10 Essential (primary) hypertension: Secondary | ICD-10-CM

## 2022-11-15 MED ORDER — DILTIAZEM HCL 120 MG PO TABS: ORAL_TABLET | ORAL | 3 refills | Status: AC

## 2023-01-13 ENCOUNTER — Ambulatory Visit (INDEPENDENT_AMBULATORY_CARE_PROVIDER_SITE_OTHER): Payer: BC Managed Care – PPO | Admitting: Internal Medicine

## 2023-01-13 ENCOUNTER — Encounter: Payer: Self-pay | Admitting: Internal Medicine

## 2023-01-13 VITALS — BP 130/80 | HR 73 | Temp 97.8°F | Ht 73.0 in | Wt 292.2 lb

## 2023-01-13 DIAGNOSIS — E1169 Type 2 diabetes mellitus with other specified complication: Secondary | ICD-10-CM

## 2023-01-13 DIAGNOSIS — E785 Hyperlipidemia, unspecified: Secondary | ICD-10-CM | POA: Diagnosis not present

## 2023-01-13 DIAGNOSIS — Z79899 Other long term (current) drug therapy: Secondary | ICD-10-CM

## 2023-01-13 DIAGNOSIS — N181 Chronic kidney disease, stage 1: Secondary | ICD-10-CM

## 2023-01-13 DIAGNOSIS — E1122 Type 2 diabetes mellitus with diabetic chronic kidney disease: Secondary | ICD-10-CM | POA: Diagnosis not present

## 2023-01-13 DIAGNOSIS — E559 Vitamin D deficiency, unspecified: Secondary | ICD-10-CM

## 2023-01-13 DIAGNOSIS — I10 Essential (primary) hypertension: Secondary | ICD-10-CM | POA: Diagnosis not present

## 2023-01-13 MED ORDER — IPRATROPIUM BROMIDE 0.06 % NA SOLN: NASAL | 3 refills | Status: AC

## 2023-01-13 MED ORDER — OLMESARTAN MEDOXOMIL 20 MG PO TABS: ORAL_TABLET | ORAL | 3 refills | Status: DC

## 2023-01-13 NOTE — Patient Instructions (Signed)

## 2023-01-13 NOTE — Progress Notes (Signed)
Future Appointments  Date Time Provider Department  01/13/2023                           6 mo   4:00 PM Lucky Cowboy, MD [facility]  04/06/2023  8:50 AM Drema Dallas, DO [facility]  07/14/2023                         cpe 11:00 AM Lucky Cowboy, MD [facility]    History of Present Illness:       This very nice 57 y.o.  MWM presents for 6 month follow up with HTN, HLD, Pre-Diabetes and Vitamin D Deficiency.  Patient has long antecedent hx/o Trigeminal  Neuralgia. Patient also is on CPAP for OSA with improved sleep hygiene.        Patient is treated for HTN (2002)  & BP has been controlled at home. Today's BP is at goal - 130/80 . Patient has had no complaints of any cardiac type chest pain, palpitations, dyspnea / orthopnea / PND, dizziness, claudication, or dependent edema.       Hyperlipidemia is controlled with diet & meds. Patient denies myalgias or other med SE's. Last Lipids were at  goal  Lab Results  Component Value Date   CHOL 123 10/11/2022   HDL 45 10/11/2022   LDLCALC 57 10/11/2022   TRIG 133 10/11/2022   CHOLHDL 2.7 10/11/2022     Also, the patient has history of PreDiabetes (A1c 6.3% /2008) and     On 03/31/2022 patient was Rx'd [facility]  which he never started . In Jan 2024  transitioned to T2_DM with A1c= 6.6% . He has had no symptoms of reactive hypoglycemia, diabetic polys, paresthesias or visual blurring.  Last A1c was near goal :  Lab Results  Component Value Date   HGBA1C 6.6 (H) 10/11/2022                                                      Further, the patient also has history of Vitamin D Deficiency ("31" /2008) and supplements vitamin D without any suspected side-effects. Last vitamin D was at goal :  Lab Results  Component Value Date   VD25OH 64 10/11/2022       Current Outpatient Medications on File Prior to Visit  Medication Sig   VITAMIN C 500 MG CAPS Take 1 capsule  daily.   aspirin 81 MG chewable tablet Chew 1 tablet  daily.    VITAMIN D Take 10,000 Units daily.    diltiazem  120 MG tablet Take 1 tablet daily    FLONASE  nasal spray USE ONE SPRAY EACH NOSTRIL TWICE DAILY   Lecithin 1200 MG CAPS Take  daily.   lisinopril 40 MG tablet Take 1 tablet daily for blood pressure   Magnesium 250 MG TABS Take 500 mg  2  times a day.    meloxicam 15 MG tablet Take  1/2 to 1 tablet  Daily     MultiVit Eual Fines (MEN  Take daily.   Omega-3 FISH OIL  1,200 mg  Take Take 2 tablets- morning and 2 tablets -evening   oxcarbazepine 600 MG tablet Take 1 tablet 2  times daily.   rosuvastatin 10 MG tablet TAKE 1 TABLET  DAILY    Semaglutide                                                                                                                                                     /OZEMPIC 0.5 MG/DOSE - 2 MG/1.5ML SOPN INJECT 0.5  MG WEEKLY INTO SKIN   sildenafil 100 MG tablet 1/2-1 pill as needed 1 hour before XXXX   torsemide 20 MG tablet Take  1 tablet  1  to 2 x /day for Edema   valACYclovir 1000 MG t\ Take 1 tablet Daily as needed.   zinc 50 MG tablet Take  daily.     Allergies  Allergen Reactions   Toprol Xl [Metoprolol Tartrate]     ED     PMHx:   Past Medical History:  Diagnosis Date   Allergy    Arthritis    Elevated hemoglobin A1c    Elevated LDL cholesterol level    Hx of colonic polyp 11/20/2014   Hyperlipidemia    Hypertension    Hypogonadism male    Neuromuscular disorder (HCC)    Trigeminar neuralgia   Sleep apnea    wears CPAP   Vitamin D deficiency      Immunization History  Administered Date(s) Administered   Influenza Inj Mdck Quad  06/02/2017, 06/28/2018, 07/23/2019   Influenza 07/10/2014   Influenza 07/19/2016   PFIZER SARS-COV-2 Vacc 01/03/2020, 01/28/2020   PPD Test 04/18/2019, 05/23/2020, 06/05/2021   Pneumococcal-23 09/20/2009   Td 09/20/2005   Tdap 02/03/2016     Past Surgical History:  Procedure Laterality Date   c5-6 fusion     2012   COLONOSCOPY  01/30/2018    Dr.Gessner   HIP FRACTURE SURGERY Right 1986   avulsion/chip fracture ?stress fracture   POLYPECTOMY      FHx:    Reviewed / unchanged  SHx:    Reviewed / unchanged   Systems Review:  Constitutional: Denies fever, chills, wt changes, headaches, insomnia, fatigue, night sweats, change in appetite. Eyes: Denies redness, blurred vision, diplopia, discharge, itchy, watery eyes.  ENT: Denies discharge, congestion, post nasal drip, epistaxis, sore throat, earache, hearing loss, dental pain, tinnitus, vertigo, sinus pain, snoring.  CV: Denies chest pain, palpitations, irregular heartbeat, syncope, dyspnea, diaphoresis, orthopnea, PND, claudication or edema. Respiratory: denies cough, dyspnea, DOE, pleurisy, hoarseness, laryngitis, wheezing.  Gastrointestinal: Denies dysphagia, odynophagia, heartburn, reflux, water brash, abdominal pain or cramps, nausea, vomiting, bloating, diarrhea, constipation, hematemesis, melena, hematochezia  or hemorrhoids. Genitourinary: Denies dysuria, frequency, urgency, nocturia, hesitancy, discharge, hematuria or flank pain. Musculoskeletal: Denies arthralgias, myalgias, stiffness, jt. swelling, pain, limping or strain/sprain.  Skin: Denies pruritus, rash, hives, warts, acne, eczema or change in skin lesion(s). Neuro: No weakness, tremor, incoordination, spasms, paresthesia or pain. Psychiatric: Denies confusion, memory loss or sensory loss. Endo: Denies change in weight, skin or hair change.  Heme/Lymph: No excessive bleeding, bruising or enlarged lymph nodes.  Physical Exam  BP 130/80   Pulse 73   Temp 97.8 F (36.6 C)   Ht  (1.854 m)   Wt 292 lb 3.2 oz (132.5 kg)   SpO2 98%   BMI 38.55 kg/m   Appears  well nourished, well groomed  and in no distress.  Eyes: PERRLA, EOMs, conjunctiva no swelling or erythema. Sinuses: No frontal/maxillary tenderness ENT/Mouth: EAC's clear, TM's nl w/o erythema, bulging. Nares clear w/o erythema, swelling,  exudates. Oropharynx clear without erythema or exudates. Oral hygiene is good. Tongue normal, non obstructing. Hearing intact.  Neck: Supple. Thyroid not palpable. Car 2+/2+ without bruits, nodes or JVD. Chest: Respirations nl with BS clear & equal w/o rales, rhonchi, wheezing or stridor.  Cor: Heart sounds normal w/ regular rate and rhythm without sig. murmurs, gallops, clicks or rubs. Peripheral pulses normal and equal  without edema.  Abdomen: Soft & bowel sounds normal. Non-tender w/o guarding, rebound, hernias, masses or organomegaly.  Lymphatics: Unremarkable.  Musculoskeletal: Full ROM all peripheral extremities, joint stability, 5/5 strength and normal gait.  Skin: Warm, dry without exposed rashes, lesions or ecchymosis apparent.  Neuro: Cranial nerves intact, reflexes equal bilaterally. Sensory-motor testing grossly intact. Tendon reflexes grossly intact.  Pysch: Alert & oriented x 3.  Insight and judgement nl & appropriate. No ideations.  Assessment and Plan:  1 . Essential hypertension  - Continue medication, monitor blood pressure at home.  - Continue DASH diet.  Reminder to go to the ER if any CP,  SOB, nausea, dizziness, severe HA, changes vision/speech.    - Continue diet/meds, exercise,& lifestyle modifications.  - Continue monitor periodic cholesterol/liver & renal functions   - CBC with Differential/Platelet - COMPLETE METABOLIC PANEL WITH GFR - Magnesium - TSH  2. Hyperlipidemia, mixed  - Lipid panel - TSH  3. Abnormal glucose  - Continue diet, exercise  - Lifestyle modifications.  - Monitor appropriate labs    - Hemoglobin A1c - Insulin, random  4. Vitamin D deficiency  - Continue supplementation   - VITAMIN D 25 Hydroxy   5. Trigeminal neuralgia of right side of face   6. Medication management  - CBC with Differential/Platelet - COMPLETE METABOLIC PANEL WITH GFR - Magnesium - Lipid panel - TSH - Hemoglobin A1c - Insulin, random - VITAMIN  D 25 Hydroxy           Discussed  regular exercise, BP monitoring, weight control to achieve/maintain BMI less than 25 and discussed med and SE's. Recommended labs to assess and monitor clinical status with further disposition pending results of labs.  I discussed the assessment and treatment plan with the patient. The patient was provided an opportunity to ask questions and all were answered. The patient agreed with the plan and demonstrated an understanding of the instructions.  I provided over 30 minutes of exam, counseling, chart review and  complex critical decision making.        The patient was advised to call back or seek an in-person evaluation if the symptoms worsen or if the condition fails to improve as anticipated.   Marinus Maw, MD

## 2023-01-14 LAB — COMPLETE METABOLIC PANEL WITH GFR
AG Ratio: 1.6 (calc) (ref 1.0–2.5)
ALT: 34 U/L (ref 9–46)
AST: 36 U/L — ABNORMAL HIGH (ref 10–35)
Albumin: 4.4 g/dL (ref 3.6–5.1)
Alkaline phosphatase (APISO): 71 U/L (ref 35–144)
BUN: 9 mg/dL (ref 7–25)
CO2: 28 mmol/L (ref 20–32)
Calcium: 9.6 mg/dL (ref 8.6–10.3)
Chloride: 100 mmol/L (ref 98–110)
Creat: 1.19 mg/dL (ref 0.70–1.30)
Globulin: 2.8 g/dL (calc) (ref 1.9–3.7)
Glucose, Bld: 89 mg/dL (ref 65–99)
Potassium: 4 mmol/L (ref 3.5–5.3)
Sodium: 139 mmol/L (ref 135–146)
Total Bilirubin: 0.3 mg/dL (ref 0.2–1.2)
Total Protein: 7.2 g/dL (ref 6.1–8.1)
eGFR: 71 mL/min/{1.73_m2} (ref >=60)

## 2023-01-14 LAB — VITAMIN D 25 HYDROXY (VIT D DEFICIENCY, FRACTURES): Vit D, 25-Hydroxy: 80 ng/mL (ref 30–100)

## 2023-01-14 LAB — CBC WITH DIFFERENTIAL/PLATELET
Absolute Monocytes: 797 cells/uL (ref 200–950)
Basophils Absolute: 47 cells/uL (ref 0–200)
Basophils Relative: 0.8 %
Eosinophils Absolute: 319 cells/uL (ref 15–500)
Eosinophils Relative: 5.4 %
HCT: 41.9 % (ref 38.5–50.0)
Hemoglobin: 14.2 g/dL (ref 13.2–17.1)
Lymphs Abs: 2047 cells/uL (ref 850–3900)
MCH: 30.1 pg (ref 27.0–33.0)
MCHC: 33.9 g/dL (ref 32.0–36.0)
MCV: 89 fL (ref 80.0–100.0)
MPV: 9.7 fL (ref 7.5–12.5)
Monocytes Relative: 13.5 %
Neutro Abs: 2690 cells/uL (ref 1500–7800)
Neutrophils Relative %: 45.6 %
Platelets: 285 10*3/uL (ref 140–400)
RBC: 4.71 10*6/uL (ref 4.20–5.80)
RDW: 14 % (ref 11.0–15.0)
Total Lymphocyte: 34.7 %
WBC: 5.9 10*3/uL (ref 3.8–10.8)

## 2023-01-14 LAB — LIPID PANEL
Cholesterol: 132 mg/dL (ref <200)
HDL: 39 mg/dL — ABNORMAL LOW (ref >=40)
LDL Cholesterol (Calc): 61 mg/dL (calc)
Non-HDL Cholesterol (Calc): 93 mg/dL (calc) (ref <130)
Total CHOL/HDL Ratio: 3.4 (calc) (ref <5.0)
Triglycerides: 276 mg/dL — ABNORMAL HIGH (ref <150)

## 2023-01-14 LAB — INSULIN, RANDOM: Insulin: 53.6 u[IU]/mL — ABNORMAL HIGH

## 2023-01-14 LAB — HEMOGLOBIN A1C
Hgb A1c MFr Bld: 6.6 % of total Hgb — ABNORMAL HIGH (ref <5.7)
Mean Plasma Glucose: 143 mg/dL
eAG (mmol/L): 7.9 mmol/L

## 2023-01-14 LAB — MAGNESIUM: Magnesium: 1.9 mg/dL (ref 1.5–2.5)

## 2023-01-14 LAB — TSH: TSH: 2.25 mIU/L (ref 0.40–4.50)

## 2023-01-15 ENCOUNTER — Encounter: Payer: Self-pay | Admitting: Internal Medicine

## 2023-01-15 NOTE — Addendum Note (Signed)
Addended by: Lucky Cowboy on: 01/15/2023 10:36 PM   Modules accepted: Orders

## 2023-01-16 ENCOUNTER — Other Ambulatory Visit: Payer: Self-pay | Admitting: Internal Medicine

## 2023-01-16 DIAGNOSIS — E1122 Type 2 diabetes mellitus with diabetic chronic kidney disease: Secondary | ICD-10-CM

## 2023-01-16 MED ORDER — TIRZEPATIDE 2.5 MG/0.5ML ~~LOC~~ SOAJ
SUBCUTANEOUS | 0 refills | Status: DC
Start: 2023-01-16 — End: 2023-02-14

## 2023-01-16 NOTE — Progress Notes (Signed)
^<^<^<^<^<^<^<^<^<^<^<^<^<^<^<^<^<^<^<^<^<^<^<^<^<^<^<^<^<^<^<^<^<^<^<^<^ ^>^>^>^>^>^>^>^>^>^>^>>^>^>^>^>^>^>^>^>^>^>^>^>^>^>^>^>^>^>^>^>^>^>^>^>^>  -Test results slightly outside the reference range are not unusual. If there is anything important, I will review this with you,  otherwise it is considered normal test values.  If you have further questions,  please do not hesitate to contact me at the office or via My Chart.   ^<^<^<^<^<^<^<^<^<^<^<^<^<^<^<^<^<^<^<^<^<^<^<^<^<^<^<^<^<^<^<^<^<^<^<^<^ ^>^>^>^>^>^>^>^>^>^>^>^>^>^>^>^>^>^>^>^>^>^>^>^>^>^>^>^>^>^>^>^>^>^>^>^>^  -  total chol = 132  -  Excellent   - Very low risk for Heart Attack  / Stroke  ^<^<^<^<^<^<^<^<^<^<^<^<^<^<^<^<^<^<^<^<^<^<^<^<^<^<^<^<^<^<^<^<^<^<^<^<^  - But,  Triglycerides (  = 276  ) or fats in blood are too high                 (   Ideal or  Goal is less than 150  !  )    - Recommend avoid fried & greasy foods,  sweets / candy,   - Avoid white rice  (brown or wild rice or Quinoa is OK),   - Avoid white potatoes  (sweet potatoes are OK)   - Avoid anything made from white flour  - bagels, doughnuts, rolls, buns, biscuits, white and   wheat breads, pizza crust and traditional  pasta made of white flour & egg white  - (vegetarian pasta or spinach or wheat pasta is OK).    - Multi-grain bread is OK - like multi-grain flat bread or  sandwich thins.   - Avoid alcohol in excess.   - Exercise is also important. ^<^<^<^<^<^<^<^<^<^<^<^<^<^<^<^<^<^<^<^<^<^<^<^<^<^<^<^<^<^<^<^<^<^<^<^<^ ^>^>^>^>^>^>^>^>^>^>^>^>^>^>^>^>^>^>^>^>^>^>^>^>^>^>^>^>^>^>^>^>^>^>^>^>^  -  A1c = 6.6% - Your blood sugar and A1c are STILL elevated in diabetic range   Being diabetic has a  300% increased risk for heart attack,  stroke, cancer and                                                                               alzheimer- type vascular dementia.    It is very important that you work harder with diet by  avoiding all  foods that are                                                                  white except chicken, fish & calliflower.  - Avoid white rice  (brown & wild rice is OK),   - Avoid white potatoes  (sweet potatoes in moderation is OK),   White bread or wheat bread or anything made out of   white flour like bagels, donuts, rolls, buns, biscuits, cakes,  - pastries, cookies, pizza crust, and pasta (made from  white flour & egg whites)   - vegetarian pasta or spinach or wheat pasta is OK.  - Multigrain breads like Arnold's, Pepperidge [employer] or   multigrain sandwich thins or high fiber breads like   [place] bread or "Dave's Killer" breads that are  4 to 5 grams fiber per slice !  are best.    Diet, exercise and weight loss can reverse and cure  diabetes in the early stages.    - Diet, exercise  and weight loss is very important in the   control and prevention of complications of diabetes which  affects every system in your body, ie.   -Brain - dementia/stroke,  - eyes - glaucoma/blindness,  - heart - heart attack/heart failure,  - kidneys - dialysis,  - stomach - gastric paralysis,  - intestines - malabsorption,  - nerves - severe painful neuritis,  - circulation - gangrene & loss of a leg(s)  - and finally  . . . . . . . . . . . . . . . . . .    - cancer and Alzheimers.  ^>^>^>^>^>^>^>^>^>^>^>^>^>^>^>^>^>^>^>^>^>^>^>^>^>^>^>^>^>^>^>^>^>^>^>^>^>  - So Sent in New Rx for [facility] - a shot once / week to   help with Diabetes & weight loss & cure your Diabetes                                                                             - Hopefully your Insurance will cover   ^<^<^<^<^<^<^<^<^<^<^<^<^<^<^<^<^<^<^<^<^<^<^<^<^<^<^<^<^<^<^<^<^<^<^<^<^ ^>^>^>^>^>^>^>^>^>^>^>^>^>^>^>^>^>^>^>^>^>^>^>^>^>^>^>^>^>^>^>^>^>^>^>^>^  - Vitamin D = 80 - Excellent - Please keep dose same    ^<^<^<^<^<^<^<^<^<^<^<^<^<^<^<^<^<^<^<^<^<^<^<^<^<^<^<^<^<^<^<^<^<^<^<^<^ ^>^>^>^>^>^>^>^>^>^>^>^>^>^>^>^>^>^>^>^>^>^>^>^>^>^>^>^>^>^>^>^>^>^>^>^>^  - All Else - CBC - Kidneys - Electrolytes - Liver - Magnesium & Thyroid    - all  Normal / OK ^<^<^<^<^<^<^<^<^<^<^<^<^<^<^<^<^<^<^<^<^<^<^<^<^<^<^<^<^<^<^<^<^<^<^<^<^ ^>^>^>^>^>^>^>^>^>^>^>^>^>^>^>^>^>^>^>^>^>^>^>^>^>^>^>^>^>^>^>^>^>^>^>^>^

## 2023-01-18 ENCOUNTER — Telehealth: Payer: Self-pay

## 2023-01-18 NOTE — Telephone Encounter (Signed)
Prior auth completed and submitted. 

## 2023-01-20 IMAGING — US US EXTREM LOW*L* LIMITED
1 series · 8 of 8 positions shown · non-contrast
Comparison: None.

CLINICAL DATA: Lateral left ankle swelling

EXAM:
ULTRASOUND LEFT LOWER EXTREMITY LIMITED
TECHNIQUE: Ultrasound examination of the lower extremity soft tissues was
performed in the area of clinical concern.

[Series 1: us extrem low*left* limited · 0.08mm/px · 8 of 8 slices shown]
[im 1/8]
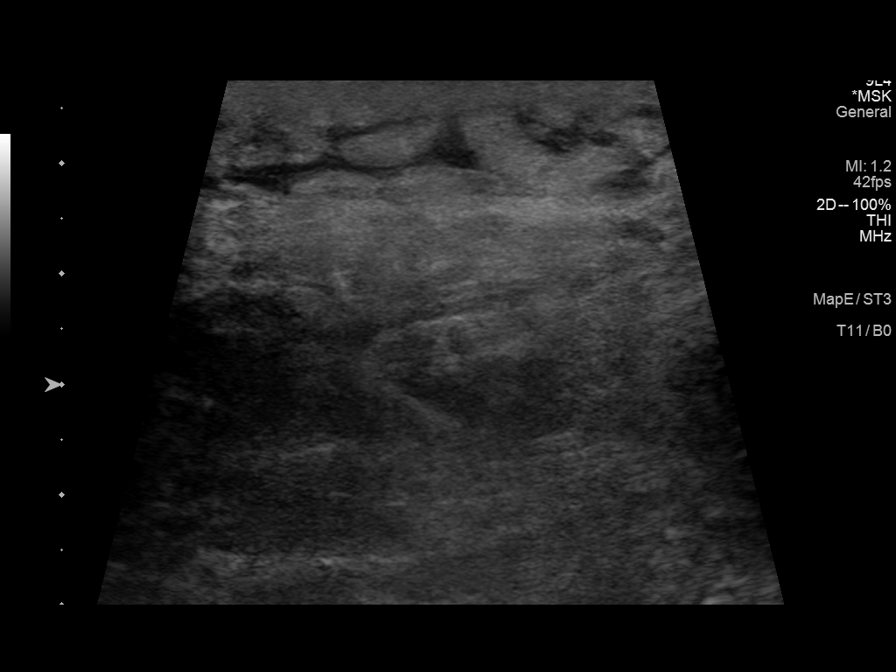
[im 2/8]
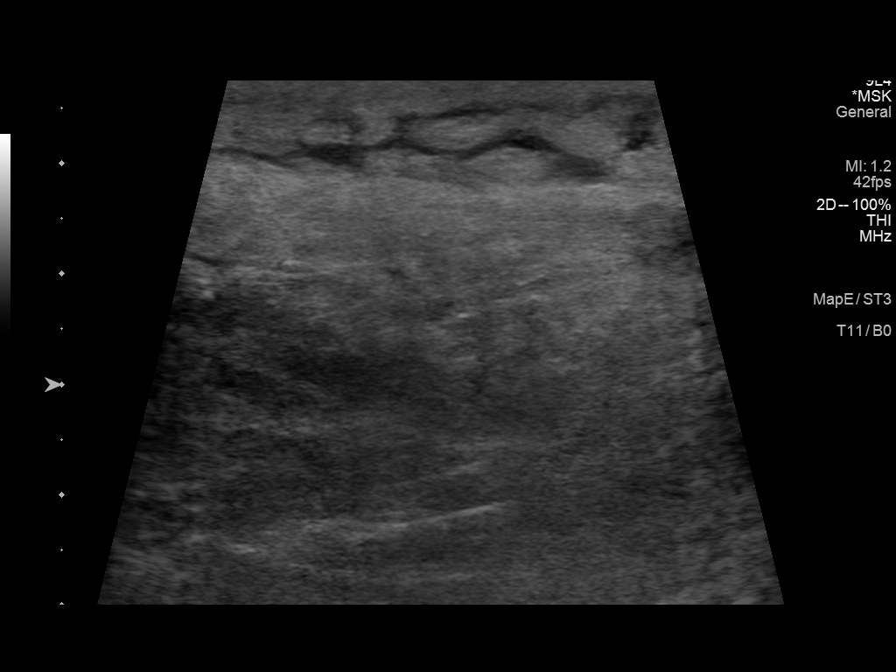
[im 3/8]
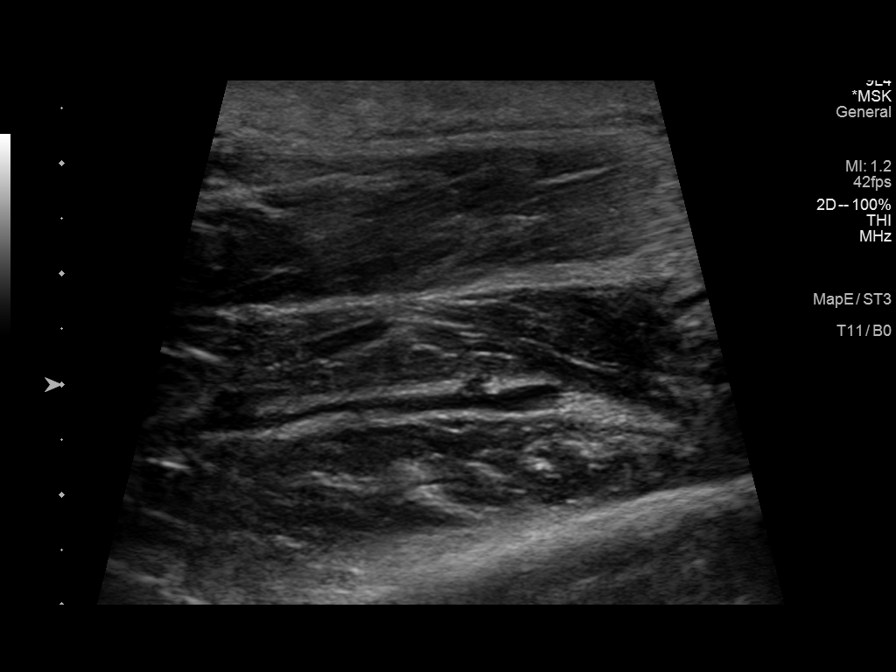
[im 4/8]
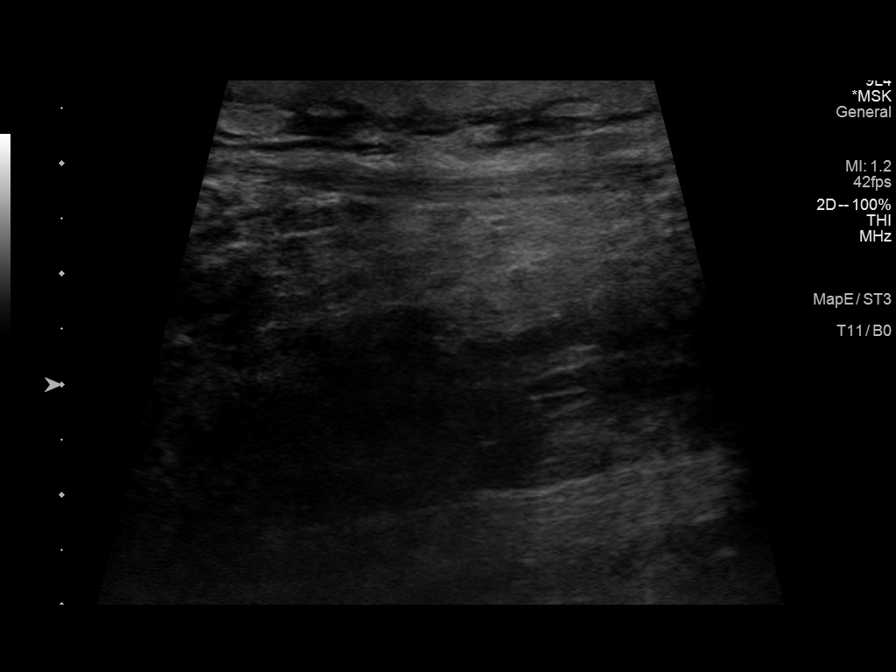
[im 5/8]
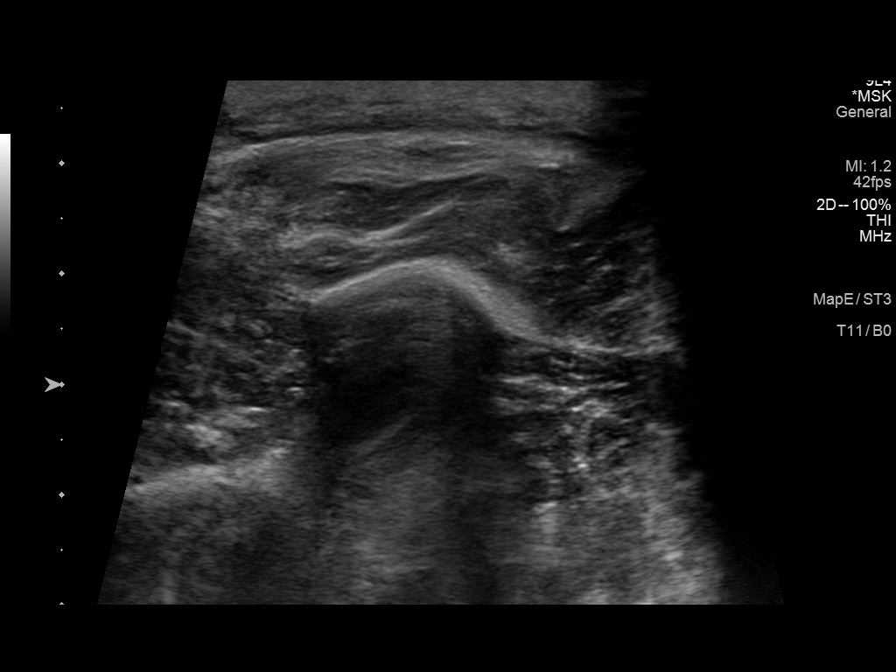
[im 6/8]
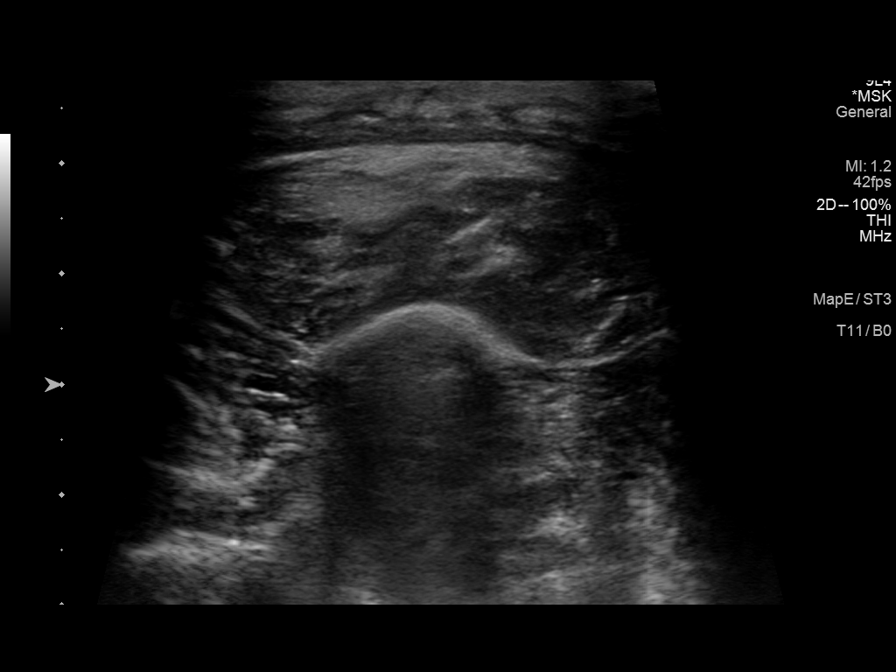
[im 7/8]
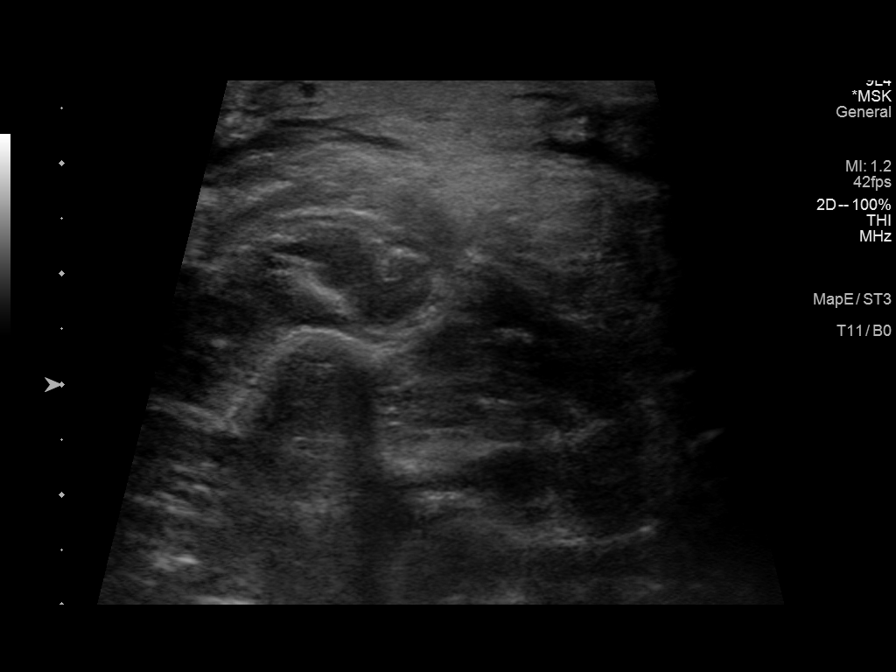
[im 8/8]
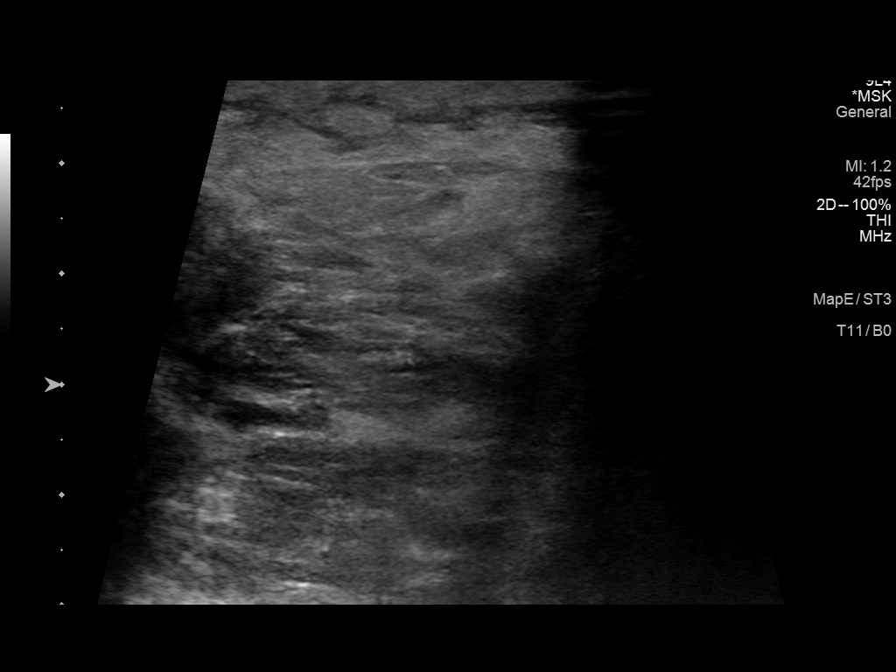

[8 of 8 positions shown; findings below may reference images not displayed]

FINDINGS: Targeted ultrasound performed of the soft tissues of the left lower
extremity at site of patient's clinical concern. There is
subcutaneous edema within the soft tissues of the lateral lower leg
and ankle. No organized or drainable fluid collections. No evidence
of a soft tissue mass at this location.
IMPRESSION: Nonspecific subcutaneous edema at the lateral aspect of the left
lower leg and ankle.

## 2023-01-20 NOTE — Telephone Encounter (Signed)
Sent in A1C labs for prior auth 01/20/23.

## 2023-01-28 NOTE — Telephone Encounter (Signed)
Prior auth approved through 01/20/26

## 2023-02-14 ENCOUNTER — Other Ambulatory Visit: Payer: Self-pay | Admitting: Internal Medicine

## 2023-02-14 DIAGNOSIS — E1122 Type 2 diabetes mellitus with diabetic chronic kidney disease: Secondary | ICD-10-CM

## 2023-02-14 MED ORDER — TIRZEPATIDE 5 MG/0.5ML ~~LOC~~ SOAJ
SUBCUTANEOUS | 0 refills | Status: DC
Start: 2023-02-14 — End: 2023-03-14

## 2023-03-10 ENCOUNTER — Other Ambulatory Visit: Payer: Self-pay | Admitting: Nurse Practitioner

## 2023-03-10 DIAGNOSIS — E782 Mixed hyperlipidemia: Secondary | ICD-10-CM

## 2023-03-13 ENCOUNTER — Other Ambulatory Visit: Payer: Self-pay | Admitting: Internal Medicine

## 2023-03-13 DIAGNOSIS — R600 Localized edema: Secondary | ICD-10-CM

## 2023-03-13 DIAGNOSIS — E1122 Type 2 diabetes mellitus with diabetic chronic kidney disease: Secondary | ICD-10-CM
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
## 2023-04-06 ENCOUNTER — Encounter: Payer: Self-pay | Admitting: Neurology

## 2023-04-06 ENCOUNTER — Ambulatory Visit: Payer: BC Managed Care – PPO | Admitting: Neurology

## 2023-04-06 VITALS — BP 115/78 | HR 75 | Ht 73.0 in | Wt 265.0 lb

## 2023-04-06 DIAGNOSIS — G500 Trigeminal neuralgia: Secondary | ICD-10-CM

## 2023-04-06 DIAGNOSIS — G43109 Migraine with aura, not intractable, without status migrainosus: Secondary | ICD-10-CM | POA: Diagnosis not present

## 2023-04-06 MED ORDER — RIZATRIPTAN BENZOATE 10 MG PO TABS: 10.0000 mg | ORAL_TABLET | ORAL | 5 refills | Status: DC | PRN

## 2023-04-06 NOTE — Patient Instructions (Signed)
At earliest onset of migraine, take rizatriptan.  May repeat after 2 hours.  Maximum 2 tablets in 24 hours. Oxcarbazepine 600mg  twice daily Limit use of pain relievers to no more than 2 days out of week to prevent risk of rebound or medication-overuse headache. Limit caffeine intake Follow up one year

## 2023-04-10 ENCOUNTER — Other Ambulatory Visit: Payer: Self-pay | Admitting: Nurse Practitioner

## 2023-04-10 DIAGNOSIS — E1122 Type 2 diabetes mellitus with diabetic chronic kidney disease: Secondary | ICD-10-CM

## 2023-05-25 ENCOUNTER — Encounter: Payer: Self-pay | Admitting: Internal Medicine

## 2023-06-05 ENCOUNTER — Other Ambulatory Visit: Payer: Self-pay | Admitting: Nurse Practitioner

## 2023-06-05 DIAGNOSIS — E1122 Type 2 diabetes mellitus with diabetic chronic kidney disease: Secondary | ICD-10-CM

## 2023-06-20 ENCOUNTER — Other Ambulatory Visit: Payer: Self-pay | Admitting: Nurse Practitioner

## 2023-06-20 DIAGNOSIS — E782 Mixed hyperlipidemia: Secondary | ICD-10-CM

## 2023-07-14 ENCOUNTER — Ambulatory Visit (INDEPENDENT_AMBULATORY_CARE_PROVIDER_SITE_OTHER): Payer: BC Managed Care – PPO | Admitting: Internal Medicine

## 2023-07-14 ENCOUNTER — Encounter: Payer: Self-pay | Admitting: Internal Medicine

## 2023-07-14 VITALS — BP 118/70 | HR 78 | Temp 97.9°F | Resp 16 | Ht 72.0 in | Wt 255.4 lb

## 2023-07-14 DIAGNOSIS — Z131 Encounter for screening for diabetes mellitus: Secondary | ICD-10-CM

## 2023-07-14 DIAGNOSIS — I10 Essential (primary) hypertension: Secondary | ICD-10-CM

## 2023-07-14 DIAGNOSIS — E559 Vitamin D deficiency, unspecified: Secondary | ICD-10-CM

## 2023-07-14 DIAGNOSIS — R350 Frequency of micturition: Secondary | ICD-10-CM

## 2023-07-14 DIAGNOSIS — Z1389 Encounter for screening for other disorder: Secondary | ICD-10-CM

## 2023-07-14 DIAGNOSIS — Z0001 Encounter for general adult medical examination with abnormal findings: Secondary | ICD-10-CM

## 2023-07-14 DIAGNOSIS — Z23 Encounter for immunization: Secondary | ICD-10-CM | POA: Diagnosis not present

## 2023-07-14 DIAGNOSIS — Z0000 Encounter for general adult medical examination without abnormal findings: Secondary | ICD-10-CM

## 2023-07-14 DIAGNOSIS — Z1329 Encounter for screening for other suspected endocrine disorder: Secondary | ICD-10-CM

## 2023-07-14 DIAGNOSIS — Z79899 Other long term (current) drug therapy: Secondary | ICD-10-CM

## 2023-07-14 DIAGNOSIS — E349 Endocrine disorder, unspecified: Secondary | ICD-10-CM

## 2023-07-14 DIAGNOSIS — N401 Enlarged prostate with lower urinary tract symptoms: Secondary | ICD-10-CM | POA: Diagnosis not present

## 2023-07-14 DIAGNOSIS — Z13220 Encounter for screening for lipoid disorders: Secondary | ICD-10-CM | POA: Diagnosis not present

## 2023-07-14 DIAGNOSIS — I700 Atherosclerosis of aorta: Secondary | ICD-10-CM | POA: Diagnosis not present

## 2023-07-14 DIAGNOSIS — Z130 Encounter for screening for diseases of the blood and blood-forming organs and certain disorders involving the immune mechanism: Secondary | ICD-10-CM | POA: Diagnosis not present

## 2023-07-14 DIAGNOSIS — Z136 Encounter for screening for cardiovascular disorders: Secondary | ICD-10-CM

## 2023-07-14 DIAGNOSIS — E1122 Type 2 diabetes mellitus with diabetic chronic kidney disease: Secondary | ICD-10-CM

## 2023-07-14 DIAGNOSIS — Z125 Encounter for screening for malignant neoplasm of prostate: Secondary | ICD-10-CM | POA: Diagnosis not present

## 2023-07-14 DIAGNOSIS — R5383 Other fatigue: Secondary | ICD-10-CM

## 2023-07-14 DIAGNOSIS — Z111 Encounter for screening for respiratory tuberculosis: Secondary | ICD-10-CM

## 2023-07-14 DIAGNOSIS — Z87891 Personal history of nicotine dependence: Secondary | ICD-10-CM

## 2023-07-14 DIAGNOSIS — Z1211 Encounter for screening for malignant neoplasm of colon: Secondary | ICD-10-CM

## 2023-07-14 DIAGNOSIS — E785 Hyperlipidemia, unspecified: Secondary | ICD-10-CM

## 2023-07-14 DIAGNOSIS — Z8249 Family history of ischemic heart disease and other diseases of the circulatory system: Secondary | ICD-10-CM

## 2023-07-14 DIAGNOSIS — G500 Trigeminal neuralgia: Secondary | ICD-10-CM

## 2023-07-14 NOTE — Progress Notes (Signed)
Annual  Screening/Preventative Visit  & Comprehensive Evaluation & Examination   Future Appointments  Date Time Provider Department  07/14/2023 11:00 AM Lucky Cowboy, MD [facility]  04/05/2024  8:50 AM Drema Dallas, DO [facility]  07/24/2024 11:00 AM Lucky Cowboy, MD [facility]             This very nice 57 y.o. MWM  with HTN, HLD, diet controlled  DM and Vitamin D Deficiency  presents for a Screening /Preventative Visit & comprehensive evaluation and management of multiple medical co-morbidities.   Patient has OSA on CPAP with improved Restorative Sleep. Patient is followed by Dr Everlena Cooper for chronic Trigeminal Neuralgia.        HTN predates circa 2002. Patient's BP has been controlled at home.  Today's BP is at goal - 118/70 . Patient denies any cardiac symptoms as chest pain, palpitations, shortness of breath, dizziness or ankle swelling.       Patient's hyperlipidemia is controlled with diet and  Rosuvastatin. Patient denies myalgias or other medication SE's. Last lipids were at goal:  Lab Results  Component Value Date   CHOL 132 01/13/2023   HDL 39 (L) 01/13/2023   LDLCALC 61 01/13/2023   TRIG 276 (H) 01/13/2023   CHOLHDL 3.4 01/13/2023         Patient has Morbid Obesity (BMI 35+) and consequent PreDiabetes  (A1c 6.3% /2008) and then diet controlled diabetes. Patient denies reactive hypoglycemic symptoms, visual blurring, diabetic polys or paresthesias. Last A1c was not at goal:   Lab Results  Component Value Date   HGBA1C 6.6 (H) 01/13/2023         Patient has been treated in the past with parenteral replacement  and has been on  Testosteronewith improved stamina & sense of well being, but apparently stopped it 2 years ago .         Finally, patient has history of Vitamin D Deficiency ("31" /2008) and last vitamin D was at goal:   Lab Results  Component Value Date   VD25OH 58 01/13/2023       Current Outpatient Medications on File Prior to Visit   Medication Sig   VITAMIN C 500 MG CAPS Take 1 capsule daily.   aspirin 81 MG Chew 1 tablet  daily.   VITAMIN D 10,000 Units   Take daily.    diltiazem 120 MG tablet Take  1 tablet  Daily  for BP   doxycycline 100 MG capsule Take  1 capsule  2 x /day  with meals for  skin Infection   FLONASE   nasal spray USE ONE SPRAY EACH NOSTRIL TWICE DAILY   furosemide 40 MG tablet Take  1 tablet  Daily  for Fluid Retention    Lecithin 1200 MG CAPS Take by mouth daily.   lisinopril  40 MG tablet Take 1 tablet once daily for blood pressure   Magnesium 250 MG TABS Take 500 mg times a day.    meloxicam (15 MG tablet Take  1/2 to 1 tablet  Daily  with Food    Multiple Vitamins-Minerals  Take  daily.   Omega-3  FISH OIL 1000 MG  Take two tablets 2 x /day    oxcarbazepine (TRILEPTAL) 600 MG Take 1 tablet 2  times daily.   rosuvastatin  10 MG tablet TAKE 1 TABLET  ONCE DAILY    Semaglutide,0.25 or 0.5MG /DOS 2 MG/1.5ML  Inject 0.5 mg  weekly into skin   sildenafil (VIAGRA) 100 MG tablet  1/2-1 pill as needed 1 hour before XXXX   valACYclovir (VALTREX) 1000 MG tablet Take 1 tablet Daily to Prevent Fever Blisters   zinc 50 MG tablet Take 50 mg by mouth daily.    Allergies  Allergen Reactions   Toprol Xl [Metoprolol Tartrate]     ED     Past Medical History:  Diagnosis Date   Allergy    Arthritis    Elevated hemoglobin A1c    Elevated LDL cholesterol level    Hx of colonic polyp 11/20/2014   Hypertension    Hypogonadism male    Neuromuscular disorder (HCC)    Trigeminar neuralgia   Sleep apnea    wears CPAP   Vitamin D deficiency      Health Maintenance  Topic Date Due   COVID-19 Vaccine (3 - Pfizer risk series) 02/25/2020   INFLUENZA VACCINE  04/20/2021   Zoster Vaccines- Shingrix (2 of 2) 07/23/2021   TETANUS/TDAP  02/02/2026   Hepatitis C Screening  Completed   HIV Screening  Completed   HPV VACCINES  Aged Out     Immunization History  Administered Date(s) Administered    Influenza Inj Mdck Quad  06/02/2017, 06/28/2018, 07/23/2019   Influenza  07/10/2014   Influenza 07/19/2016   PFIZER SARS-COV-2 Vacc 01/03/2020, 01/28/2020   PPD Test 04/18/2019, 05/23/2020   Pneumococcal - 23 09/20/2009   Td 09/20/2005   Tdap 02/03/2016   Colon - 01/30/2018 - Dr Leone Payor - polyps  Last Colon  - 07/31/2021 - Dr Leone Payor - Polyps -Recc 5 yr  f/u due Nov 2027   Past Surgical History:  Procedure Laterality Date   c5-6 fusion     2012   COLONOSCOPY     HIP FRACTURE SURGERY Right 1986   avulsion/chip fracture/ stress fracture     Family History  Problem Relation Age of Onset   Colon cancer Maternal Uncle    Prostate cancer Maternal Uncle    Colon cancer Paternal Uncle    Colon cancer Maternal Grandfather    Colon cancer Paternal Grandfather    Prostate cancer Father    Stomach cancer Neg Hx    Esophageal cancer Neg Hx     Social History   Socioeconomic History   Marital status: Married    Spouse name: Not on file   Number of children: 0   Highest education level: Some college,  Occupational History    Employer: [employer]  Tobacco Use   Smoking status: Former    Types: Cigarettes    Quit date: 09/20/1989    Years since quitting: 31.7   Smokeless tobacco: Never  Vaping Use   Vaping Use: Never used  Substance and Sexual Activity   Alcohol use: No    Alcohol/week: 0.0 standard drinks   Drug use: No   Sexual activity: Yes    Partners: Female     ROS Constitutional: Denies fever, chills, weight loss/gain, headaches, insomnia,  night sweats or change in appetite. Does c/o fatigue. Eyes: Denies redness, blurred vision, diplopia, discharge, itchy or watery eyes.  ENT: Denies discharge, congestion, post nasal drip, epistaxis, sore throat, earache, hearing loss, dental pain, Tinnitus, Vertigo, Sinus pain or snoring.  Cardio: Denies chest pain, palpitations, irregular heartbeat, syncope, dyspnea, diaphoresis, orthopnea, PND, claudication or  edema Respiratory: denies cough, dyspnea, DOE, pleurisy, hoarseness, laryngitis or wheezing.  Gastrointestinal: Denies dysphagia, heartburn, reflux, water brash, pain, cramps, nausea, vomiting, bloating, diarrhea, constipation, hematemesis, melena, hematochezia, jaundice or hemorrhoids Genitourinary: Denies dysuria, frequency, urgency,  nocturia, hesitancy, discharge, hematuria or flank pain Musculoskeletal: Denies arthralgia, myalgia, stiffness, Jt. Swelling, pain, limp or strain/sprain. Denies Falls. Skin: Denies puritis, rash, hives, warts, acne, eczema or change in skin lesion Neuro: No weakness, tremor, incoordination, spasms, paresthesia or pain Psychiatric: Denies confusion, memory loss or sensory loss. Denies Depression. Endocrine: Denies change in weight, skin, hair change, nocturia, and paresthesia, diabetic polys, visual blurring or hyper / hypo glycemic episodes.  Heme/Lymph: No excessive bleeding, bruising or enlarged lymph nodes.   Physical Exam  BP 118/70   Pulse 78   Temp 97.9 F (36.6 C)   Resp 16   Ht 6' (1.829 m)   Wt 255 lb 6.4 oz (115.8 kg)   SpO2 98%   BMI 34.64 kg/m   General Appearance: Well nourished and well groomed and in no apparent distress.  Eyes: PERRLA, EOMs, conjunctiva no swelling or erythema, normal fundi and vessels. Sinuses: No frontal/maxillary tenderness ENT/Mouth: EACs patent / TMs  nl. Nares clear without erythema, swelling, mucoid exudates. Oral hygiene is good. No erythema, swelling, or exudate. Tongue normal, non-obstructing. Tonsils not swollen or erythematous. Hearing normal.  Neck: Supple, thyroid not palpable. No bruits, nodes or JVD. Respiratory: Respiratory effort normal.  BS equal and clear bilateral without rales, rhonci, wheezing or stridor. Cardio: Heart sounds are normal with regular rate and rhythm and no murmurs, rubs or gallops. Peripheral pulses are normal and equal bilaterally without edema. No aortic or femoral  bruits. Chest: symmetric with normal excursions and percussion.  Abdomen: Soft, with Nl bowel sounds. Nontender, no guarding, rebound, hernias, masses, or organomegaly.  Lymphatics: Non tender without lymphadenopathy.  Musculoskeletal: Full ROM all peripheral extremities, joint stability, 5/5 strength, and normal gait. Skin: Warm and dry without rashes, cyanosis, clubbing or  ecchymosis.  Neuro: Cranial nerves intact, reflexes equal bilaterally. Normal muscle tone, no cerebellar symptoms. Sensation intact.  Pysch: Alert and oriented X 3 with normal affect, insight and judgment appropriate.   Assessment and Plan  1. Annual Preventative/Screening Exam    2. Essential hypertension  - CBC with Differential/Platelet - COMPLETE METABOLIC PANEL WITH GFR - Magnesium - TSH - EKG 12-Lead - Urinalysis, Routine w reflex microscopic - Microalbumin / creatinine urine ratio  3. Right lower quadrant abdominal pain  - CT Abdomen Pelvis Wo Contrast; Future  4. Hyperlipidemia associated with type 2 diabetes mellitus (HCC)  - Lipid panel - TSH - EKG 12-Lead  5. Controlled type 2 diabetes mellitus with stage 1 chronic                                           kidney disease, without long-term current use of insulin (HCC)  - Hemoglobin A1c - Insulin, random - EKG 12-Lead  6. Vitamin D deficiency  - VITAMIN D 25 Hydroxy   7. Testosterone deficiency  - Testosterone  8. Trigeminal neuralgia of right side of face   9. Prostate cancer screening  - PSA  10. Screening-pulmonary TB  - TB Skin Test  11. Screening for colorectal cancer  - POC Hemoccult Bld/Stl - [place], RETROPERITNL ABD,  LTD  12. Screening for heart disease  - EKG 12-Lead  13. FH: hypertension  - EKG 12-Lead - [place], RETROPERITNL ABD,  LTD  14. Former smoker  - EKG 12-Lead - [place], RETROPERITNL ABD,  LTD  15. Screening for AAA (aortic abdominal aneurysm)  - [place], RETROPERITNL ABD,  LTD  16. Fatigue,  unspecified type  - Testosterone - Iron, Total/Total Iron Binding Cap - Vitamin B12  17. Need for immunization against influenza  - Flu Vaccine QUAD 6+ mos PF IM (Fluarix Quad PF)  18. Medication management  - CBC with Differential/Platelet - COMPLETE METABOLIC PANEL WITH GFR - Magnesium - Lipid panel - TSH - Hemoglobin A1c - Insulin, random - VITAMIN D 25 Hydroxy  - Urinalysis, Routine w reflex microscopic - Microalbumin / creatinine urine ratio           Patient was counseled in prudent diet, weight control to achieve/maintain BMI less than 25, BP monitoring, regular exercise and medications as discussed.  Discussed med effects and SE's. Routine screening labs and tests as requested with regular follow-up as recommended. Over 40 minutes of exam, counseling, chart review and high complex critical decision making was performed   Marinus Maw, MD

## 2023-07-14 NOTE — Patient Instructions (Signed)

## 2023-07-15 LAB — CBC WITH DIFFERENTIAL/PLATELET
Absolute Lymphocytes: 1682 {cells}/uL (ref 850–3900)
Absolute Monocytes: 694 {cells}/uL (ref 200–950)
Basophils Absolute: 53 {cells}/uL (ref 0–200)
Basophils Relative: 0.6 %
Eosinophils Absolute: 178 {cells}/uL (ref 15–500)
Eosinophils Relative: 2 %
HCT: 44.6 % (ref 38.5–50.0)
Hemoglobin: 14.5 g/dL (ref 13.2–17.1)
MCH: 30.3 pg (ref 27.0–33.0)
MCHC: 32.5 g/dL (ref 32.0–36.0)
MCV: 93.3 fL (ref 80.0–100.0)
MPV: 9 fL (ref 7.5–12.5)
Monocytes Relative: 7.8 %
Neutro Abs: 6292 {cells}/uL (ref 1500–7800)
Neutrophils Relative %: 70.7 %
Platelets: 334 10*3/uL (ref 140–400)
RBC: 4.78 10*6/uL (ref 4.20–5.80)
RDW: 12.4 % (ref 11.0–15.0)
Total Lymphocyte: 18.9 %
WBC: 8.9 10*3/uL (ref 3.8–10.8)

## 2023-07-15 LAB — TSH: TSH: 2.03 m[IU]/L (ref 0.40–4.50)

## 2023-07-15 LAB — URINALYSIS, ROUTINE W REFLEX MICROSCOPIC
Bilirubin Urine: NEGATIVE
Glucose, UA: NEGATIVE
Hgb urine dipstick: NEGATIVE
Ketones, ur: NEGATIVE
Leukocytes,Ua: NEGATIVE
Nitrite: NEGATIVE
Protein, ur: NEGATIVE
Specific Gravity, Urine: 1.009 (ref 1.001–1.035)
pH: 6.5 (ref 5.0–8.0)

## 2023-07-15 LAB — VITAMIN D 25 HYDROXY (VIT D DEFICIENCY, FRACTURES): Vit D, 25-Hydroxy: 81 ng/mL (ref 30–100)

## 2023-07-15 LAB — COMPLETE METABOLIC PANEL WITHOUT GFR
AG Ratio: 1.3 (calc) (ref 1.0–2.5)
ALT: 29 U/L (ref 9–46)
AST: 27 U/L (ref 10–35)
Albumin: 4.6 g/dL (ref 3.6–5.1)
Alkaline phosphatase (APISO): 73 U/L (ref 35–144)
BUN/Creatinine Ratio: 11 (calc) (ref 6–22)
BUN: 15 mg/dL (ref 7–25)
CO2: 30 mmol/L (ref 20–32)
Calcium: 9.9 mg/dL (ref 8.6–10.3)
Chloride: 100 mmol/L (ref 98–110)
Creat: 1.42 mg/dL — ABNORMAL HIGH (ref 0.70–1.30)
Globulin: 3.5 g/dL (ref 1.9–3.7)
Glucose, Bld: 110 mg/dL — ABNORMAL HIGH (ref 65–99)
Potassium: 4.3 mmol/L (ref 3.5–5.3)
Sodium: 141 mmol/L (ref 135–146)
Total Bilirubin: 0.5 mg/dL (ref 0.2–1.2)
Total Protein: 8.1 g/dL (ref 6.1–8.1)
eGFR: 57 mL/min/1.73m2 — ABNORMAL LOW (ref >=60)

## 2023-07-15 LAB — VITAMIN B12: Vitamin B-12: 460 pg/mL (ref 200–1100)

## 2023-07-15 LAB — MICROALBUMIN / CREATININE URINE RATIO
Creatinine, Urine: 78 mg/dL (ref 20–320)
Microalb Creat Ratio: 41 mg/g{creat} — ABNORMAL HIGH (ref <30)
Microalb, Ur: 3.2 mg/dL

## 2023-07-15 LAB — MAGNESIUM: Magnesium: 2.3 mg/dL (ref 1.5–2.5)

## 2023-07-15 LAB — IRON,TIBC AND FERRITIN PANEL
%SAT: 36 % (ref 20–48)
Ferritin: 31 ng/mL — ABNORMAL LOW (ref 38–380)
Iron: 151 ug/dL (ref 50–180)
TIBC: 415 ug/dL (ref 250–425)

## 2023-07-15 LAB — LIPID PANEL
Cholesterol: 124 mg/dL (ref <200)
HDL: 56 mg/dL (ref >=40)
LDL Cholesterol (Calc): 48 mg/dL
Non-HDL Cholesterol (Calc): 68 mg/dL (ref <130)
Total CHOL/HDL Ratio: 2.2 (calc) (ref <5.0)
Triglycerides: 116 mg/dL (ref <150)

## 2023-07-15 LAB — PSA: PSA: 0.64 ng/mL (ref <=4.00)

## 2023-07-15 LAB — HEMOGLOBIN A1C
Hgb A1c MFr Bld: 6.1 %{Hb} — ABNORMAL HIGH (ref <5.7)
Mean Plasma Glucose: 128 mg/dL
eAG (mmol/L): 7.1 mmol/L

## 2023-07-15 LAB — INSULIN, RANDOM: Insulin: 174.7 u[IU]/mL — ABNORMAL HIGH

## 2023-07-15 LAB — TESTOSTERONE: Testosterone: 671 ng/dL (ref 250–827)

## 2023-07-15 NOTE — Progress Notes (Signed)
<>*<>*<>*<>*<>*<>*<>*<>*<>*<>*<>*<>*<>*<>*<>*<>*<>*<>*<>*<>*<>*<>*<>*<>*<> <>*<>*<>*<>*<>*<>*<>*<>*<>*<>*<>*<>*<>*<>*<>*<>*<>*<>*<>*<>*<>*<>*<>*<>*<>  -Test results slightly outside the reference range are not unusual. If there is anything important, I will review this with you,  otherwise it is considered normal test values.  If you have further questions,  please do not hesitate to contact me at the office or via My Chart.   <>*<>*<>*<>*<>*<>*<>*<>*<>*<>*<>*<>*<>*<>*<>*<>*<>*<>*<>*<>*<>*<>*<>*<>*<> <>*<>*<>*<>*<>*<>*<>*<>*<>*<>*<>*<>*<>*<>*<>*<>*<>*<>*<>*<>*<>*<>*<>*<>*<>  -   Eat  More Veggies with Iron as                                   Carrots, Beets , all leafy green veggies as Spinach, Collards,    Turnip - Mustard or Mixed Greens, Kale, Asparagus, Broccoli, Brussel Sprouts,   Green Beans / peas, Soybeans, Lentils, Sweet Potatoes  <>*<>*<>*<>*<>*<>*<>*<>*<>*<>*<>*<>*<>*<>*<>*<>*<>*<>*<>*<>*<>*<>*<>*<>*<> <>*<>*<>*<>*<>*<>*<>*<>*<>*<>*<>*<>*<>*<>*<>*<>*<>*<>*<>*<>*<>*<>*<>*<>*<>  -   Kidney Functions ( GFR) is decreased     -  still look a little dehydrated    Very important to drink adequate amounts of fluids to prevent permanent damage    - Recommend drink at least 6 bottles (16 ounces) of fluids /water /day = 96 Oz ~100 oz  - 100 oz = 3,000 cc or 3 liters / day  - >> That's 1 &1/2 bottles of a 2 liter soda bottle /day !   <>*<>*<>*<>*<>*<>*<>*<>*<>*<>*<>*<>*<>*<>*<>*<>*<>*<>*<>*<>*<>*<>*<>*<>*<> <>*<>*<>*<>*<>*<>*<>*<>*<>*<>*<>*<>*<>*<>*<>*<>*<>*<>*<>*<>*<>*<>*<>*<>*<>  -   A1c Better - down to 6.1%  <>*<>*<>*<>*<>*<>*<>*<>*<>*<>*<>*<>*<>*<>*<>*<>*<>*<>*<>*<>*<>*<>*<>*<>*<> <>*<>*<>*<>*<>*<>*<>*<>*<>*<>*<>*<>*<>*<>*<>*<>*<>*<>*<>*<>*<>*<>*<>*<>*<>  -   Insulin level = 174.7 is too high (  Normal is less than 20  !  )   - and  shows insulin resistance - a sign of early diabetes and   associated with a 300 % greater risk for   heart attacks,                 strokes,                        cancer &                               Alzheimer type                                         vascular dementia !  - All this can be cured  and prevented with losing weight   - get Dr Francis Dowse Fuhrman's book 'the End of Diabetes" and   "the End of Dieting"  - and  -    add many years of good health to your life.   <>*<>*<>*<>*<>*<>*<>*<>*<>*<>*<>*<>*<>*<>*<>*<>*<>*<>*<>*<>*<>*<>*<>*<>*<> <>*<>*<>*<>*<>*<>*<>*<>*<>*<>*<>*<>*<>*<>*<>*<>*<>*<>*<>*<>*<>*<>*<>*<>*<>   Vitamin B12 = 460 is very low .   -  Vitamin B12 =      Very Low  (Ideal or Goal Vit B12 is between 450 - 1,100)   Low Vit B12 may be associated with Anemia , Fatigue,   Peripheral Neuropathy, Dementia, "Brain Fog", & Depression  - Recommend take a sub-lingual form of Vitamin B12 tablet   1,000 to 5,000 mcg tab that you dissolve under your tongue /Daily   - Can get Lavonia Dana - best price at [employer] or on [employer]  <>*<>*<>*<>*<>*<>*<>*<>*<>*<>*<>*<>*<>*<>*<>*<>*<>*<>*<>*<>*<>*<>*<>*<>*<> <>*<>*<>*<>*<>*<>*<>*<>*<>*<>*<>*<>*<>*<>*<>*<>*<>*<>*<>*<>*<>*<>*<>*<>*<>  -   PSA is very low  9 normal )   Great !  No Prostate cancer   !  <>*<>*<>*<>*<>*<>*<>*<>*<>*<>*<>*<>*<>*<>*<>*<>*<>*<>*<>*<>*<>*<>*<>*<>*<> <>*<>*<>*<>*<>*<>*<>*<>*<>*<>*<>*<>*<>*<>*<>*<>*<>*<>*<>*<>*<>*<>*<>*<>*<>  -   Testosterone - Normal & OK  <>*<>*<>*<>*<>*<>*<>*<>*<>*<>*<>*<>*<>*<>*<>*<>*<>*<>*<>*<>*<>*<>*<>*<>*<> <>*<>*<>*<>*<>*<>*<>*<>*<>*<>*<>*<>*<>*<>*<>*<>*<>*<>*<>*<>*<>*<>*<>*<>*<>  -   Chol = 124  -  Excellent   - Very low risk for Heart Attack  / Stroke  <>*<>*<>*<>*<>*<>*<>*<>*<>*<>*<>*<>*<>*<>*<>*<>*<>*<>*<>*<>*<>*<>*<>*<>*<> <>*<>*<>*<>*<>*<>*<>*<>*<>*<>*<>*<>*<>*<>*<>*<>*<>*<>*<>*<>*<>*<>*<>*<>*<>  -   Vitamin D = 81 - Excellent   - Please  keep dosage same     <>*<>*<>*<>*<>*<>*<>*<>*<>*<>*<>*<>*<>*<>*<>*<>*<>*<>*<>*<>*<>*<>*<>*<>*<> <>*<>*<>*<>*<>*<>*<>*<>*<>*<>*<>*<>*<>*<>*<>*<>*<>*<>*<>*<>*<>*<>*<>*<>*<>  -   Keep up the [place] Work  !    <>*<>*<>*<>*<>*<>*<>*<>*<>*<>*<>*<>*<>*<>*<>*<>*<>*<>*<>*<>*<>*<>*<>*<>*<> <>*<>*<>*<>*<>*<>*<>*<>*<>*<>*<>*<>*<>*<>*<>*<>*<>*<>*<>*<>*<>*<>*<>*<>*<>

## 2023-07-29 ENCOUNTER — Encounter: Payer: Self-pay | Admitting: Nurse Practitioner

## 2023-07-29 ENCOUNTER — Telehealth: Payer: Self-pay | Admitting: Nurse Practitioner

## 2023-07-29 NOTE — Telephone Encounter (Signed)
Refill request on Mounjaro. Pt said he feels like it is working. He wants to increase dosage and change script to a 3 month prescription per Dr.Mckeown.

## 2023-08-04 MED ORDER — TIRZEPATIDE 7.5 MG/0.5ML ~~LOC~~ SOAJ
SUBCUTANEOUS | 0 refills | Status: DC
Start: 2023-08-04 — End: 2023-08-31

## 2023-08-09 ENCOUNTER — Encounter: Payer: Self-pay | Admitting: Internal Medicine

## 2023-08-31 ENCOUNTER — Other Ambulatory Visit: Payer: Self-pay | Admitting: Nurse Practitioner

## 2023-09-15 ENCOUNTER — Other Ambulatory Visit: Payer: Self-pay | Admitting: Nurse Practitioner

## 2023-09-15 DIAGNOSIS — E782 Mixed hyperlipidemia: Secondary | ICD-10-CM

## 2023-09-15 DIAGNOSIS — R600 Localized edema: Secondary | ICD-10-CM

## 2023-09-26 ENCOUNTER — Other Ambulatory Visit: Payer: Self-pay | Admitting: Nurse Practitioner

## 2023-09-29 ENCOUNTER — Other Ambulatory Visit: Payer: Self-pay | Admitting: Nurse Practitioner

## 2023-09-29 MED ORDER — MOUNJARO 7.5 MG/0.5ML ~~LOC~~ SOAJ: 7.5000 mg | SUBCUTANEOUS | 0 refills | Status: DC

## 2023-10-18 ENCOUNTER — Ambulatory Visit (INDEPENDENT_AMBULATORY_CARE_PROVIDER_SITE_OTHER): Payer: 59 | Admitting: Nurse Practitioner

## 2023-10-18 VITALS — BP 120/68 | HR 88 | Temp 97.9°F | Resp 17 | Ht 72.0 in | Wt 260.8 lb

## 2023-10-18 DIAGNOSIS — E1169 Type 2 diabetes mellitus with other specified complication: Secondary | ICD-10-CM | POA: Diagnosis not present

## 2023-10-18 DIAGNOSIS — N182 Chronic kidney disease, stage 2 (mild): Secondary | ICD-10-CM | POA: Diagnosis not present

## 2023-10-18 DIAGNOSIS — G500 Trigeminal neuralgia: Secondary | ICD-10-CM

## 2023-10-18 DIAGNOSIS — E1122 Type 2 diabetes mellitus with diabetic chronic kidney disease: Secondary | ICD-10-CM

## 2023-10-18 DIAGNOSIS — I10 Essential (primary) hypertension: Secondary | ICD-10-CM | POA: Diagnosis not present

## 2023-10-18 DIAGNOSIS — G4733 Obstructive sleep apnea (adult) (pediatric): Secondary | ICD-10-CM

## 2023-10-18 DIAGNOSIS — N181 Chronic kidney disease, stage 1: Secondary | ICD-10-CM

## 2023-10-18 DIAGNOSIS — K5792 Diverticulitis of intestine, part unspecified, without perforation or abscess without bleeding: Secondary | ICD-10-CM | POA: Diagnosis not present

## 2023-10-18 DIAGNOSIS — E785 Hyperlipidemia, unspecified: Secondary | ICD-10-CM

## 2023-10-18 DIAGNOSIS — E559 Vitamin D deficiency, unspecified: Secondary | ICD-10-CM

## 2023-10-18 DIAGNOSIS — Z79899 Other long term (current) drug therapy: Secondary | ICD-10-CM

## 2023-10-18 NOTE — Progress Notes (Signed)
FOLLOW UP  Assessment & Plan  Primary hypertension Continue medication Discussed DASH (Dietary Approaches to Stop Hypertension) DASH diet is lower in sodium than a typical American diet. Cut back on foods that are high in saturated fat, cholesterol, and trans fats. Eat more whole-grain foods, fish, poultry, and nuts Remain active and exercise as tolerated daily.  Monitor BP at home-Call if greater than 130/80.  Check CMP/CBC   CKD Discussed how what you eat and drink can aide in kidney protection. Stay well hydrated. Avoid high salt foods. Avoid NSAIDS. Keep BP and BG well controlled.   Take medications as prescribed. Remain active and exercise as tolerated daily. Maintain weight.  Continue to monitor. Check CMP/GFR  Hyperlipidemia Discussed lifestyle modifications. Recommended diet heavy in fruits and veggies, omega 3's. Decrease consumption of animal meats, cheeses, and dairy products. Remain active and exercise as tolerated. Continue to monitor. Check lipids/TSH  Diverticulitis No recent flare Continue to monitor diet Continue to follow with GI, Dr. Parks Ranger UTD on Coloscopy   DMII Education: Reviewed 'ABCs' of diabetes management  Discussed goals to be met and/or maintained include A1C (<7) Blood pressure (<130/80) Cholesterol (LDL <70) Continue Eye Exam yearly  Continue Dental Exam Q6 mo Discussed dietary recommendations Discussed Physical Activity recommendations Foot exam UTD Check A1C  Vitamin D Deficiency Continue supplement Monitor levels  Medication management All medications discussed and reviewed in full. All questions and concerns regarding medications addressed.    OSA on CPAP Reports compliance Continue to monitor  Morbid obesity Continue nutritional [facility] via [facility] Discussed appropriate BMI Goal of losing 1 lb per month. Diet modification. Physical activity. Encouraged/praised to build confidence.  Trigeminal  Neuralgia Continue f/ut with Dr. Everlena Cooper, Neurology  Orders Placed This Encounter  Procedures   CBC with Differential/Platelet   COMPLETE METABOLIC PANEL WITH GFR   Lipid panel   Hemoglobin A1c   Notify office for further evaluation and treatment, questions or concerns if any reported s/s fail to improve.   The patient was advised to call back or seek an in-person evaluation if any symptoms worsen or if the condition fails to improve as anticipated.   Further disposition pending results of labs. Discussed med's effects and SE's.    I discussed the assessment and treatment plan with the patient. The patient was provided an opportunity to ask questions and all were answered. The patient agreed with the plan and demonstrated an understanding of the instructions.  Discussed med's effects and SE's. Screening labs and tests as requested with regular follow-up as recommended.  I provided 30 minutes of face-to-face time during this encounter including counseling, chart review, and critical decision making was preformed.   Future Appointments  Date Time Provider Department Center  01/17/2024  4:00 PM Donald Cowboy, MD [facility] None  04/05/2024  8:50 AM Drema Dallas, DO [facility] None  04/17/2024  4:00 PM Adela Glimpse, NP [facility] None  07/24/2024 11:00 AM Donald Cowboy, MD [facility] None    ----------------------------------------------------------------------------------------------------------------------   HPI 58 y.o. male  presents for 3 month follow up on hypertension, cholesterol, diabetes, weight and vitamin D deficiency.   Overall he reports feeling well.  He has no new concerns to present in clinic today.  He is also followed by a nutritionalist, recently referred by [facility].    BMI is Body mass index is 37.05 kg/m., he has been working on diet and exercise. Wt Readings from Last 3 Encounters:  10/11/22 280 lb 12.8 oz (127.4 kg)  07/08/22 274 lb  9.6 oz (124.6 kg)   04/05/22 264 lb 3.2 oz (119.8 kg)    His blood pressure has been controlled at home, today their BP is BP: 122/72  He does not workout. He denies chest pain, shortness of breath, dizziness.   He is on cholesterol medication Rosuvastatin and denies myalgias. His cholesterol is at goal. The cholesterol last visit was:   Lab Results  Component Value Date   CHOL 116 07/08/2022   HDL 42 07/08/2022   LDLCALC 52 07/08/2022   TRIG 137 07/08/2022   CHOLHDL 2.8 07/08/2022    He has been working on diet and exercise for prediabetes, and denies polydipsia and polyuria. Last A1C in the office was:  Lab Results  Component Value Date   HGBA1C 6.4 (H) 07/08/2022   Patient is on Vitamin D supplement.   Lab Results  Component Value Date   VD25OH 77 07/08/2022     HE has a hx of trigeminal neurologia onset age 63 yo.  He follows with Neurology every 6 months.  Denies any recent concerns.  He also has hx of sleep apnea.  Compliant with CPAP machine.  Reports well rested.   Has a hx of diverticulitis. Follows GI and UTD on Colonoscopy.  No recent flares.     Current Medications:  Current Outpatient Medications on File Prior to Visit  Medication Sig   Ascorbic Acid (VITAMIN C) 500 MG CAPS Take 1 capsule by mouth daily.   aspirin 81 MG chewable tablet Chew 1 tablet (81 mg total) by mouth daily.   Cholecalciferol (VITAMIN D PO) Take 10,000 Units by mouth daily.    diltiazem (CARDIZEM) 120 MG tablet Take  1 tablet  Daily for BP   fluticasone (FLONASE) 50 MCG/ACT nasal spray USE ONE SPRAY EACH NOSTRIL TWICE DAILY   ipratropium (ATROVENT) 0.06 % nasal spray Use 1 to 2 sprays each nostril 2 to 3 x /day as needed   Magnesium 250 MG TABS Take 500 mg by mouth 2 (two) times a day.    meloxicam (MOBIC) 15 MG tablet Take  1/2 to 1 tablet  Daily  with Food  for Pain /Inflammation & try limit to 5 days /week to Avoid Kidney Damage   Multiple Vitamins-Minerals (MULTIVITAMIN MEN PO) Take by mouth daily.    olmesartan (BENICAR) 20 MG tablet Take  1 tablet  at Night  for BP   Omega-3 Fatty Acids (FISH OIL) 1000 MG CAPS Take 1,200 mg by mouth. Take two tablets in the morning and two tablets in the evening   oxcarbazepine (TRILEPTAL) 600 MG tablet Take 1 tablet (600 mg total) by mouth 2 (two) times daily.   rizatriptan (MAXALT) 10 MG tablet Take 1 tablet (10 mg total) by mouth as needed for migraine. May repeat in 2 hours if needed.  Maximum 2 tablets in 24 hours.   rosuvastatin (CRESTOR) 10 MG tablet TAKE 1 TABLET BY MOUTH ONCE DAILY FOR CHOLESTEROL   sildenafil (VIAGRA) 100 MG tablet 1/2-1 pill as needed 1 hour before intercourse, can get with good RX from [employer]   tirzepatide [facility]) 7.5 MG/0.5ML Pen Inject 7.5 mg into the skin once a week.   torsemide (DEMADEX) 20 MG tablet TAKE 1 TABLET BY MOUTH ONCE DAILY TO TWICE DAILY FOR  EDEMA   zinc gluconate 50 MG tablet Take 50 mg by mouth daily.   No current facility-administered medications on file prior to visit.     Allergies:  Allergies  Allergen Reactions  Toprol Xl [Metoprolol Tartrate]     ED     Medical History:  Past Medical History:  Diagnosis Date   Allergy    Arthritis    Elevated hemoglobin A1c    Elevated LDL cholesterol level    Hx of colonic polyp 11/20/2014   Hyperlipidemia    Hypertension    Hypogonadism male    Neuromuscular disorder (HCC)    Trigeminar neuralgia   Sleep apnea    wears CPAP   Vitamin D deficiency    Family history- Reviewed and unchanged Social history- Reviewed and unchanged   Review of Systems: All review systems reviewed and negative except for pertinent positives in history of present illness.  Physical Exam: BP 120/68   Pulse 88   Temp 97.9 F (36.6 C)   Resp 17   Ht 6' (1.829 m)   Wt 260 lb 12.8 oz (118.3 kg)   SpO2 98%   BMI 35.37 kg/m  Wt Readings from Last 3 Encounters:  10/18/23 260 lb 12.8 oz (118.3 kg)  07/14/23 255 lb 6.4 oz (115.8 kg)  04/06/23 265 lb (120.2  kg)   General Appearance: Well nourished, in no apparent distress. Eyes: PERRLA, EOMs, conjunctiva no swelling or erythema Sinuses: No Frontal/maxillary tenderness ENT/Mouth: Ext aud canals clear, TMs without erythema, bulging. No erythema, swelling, or exudate on post pharynx.  Tonsils not swollen or erythematous. Hearing normal.  Neck: Supple, thyroid normal.  Respiratory: Respiratory effort normal, BS equal bilaterally without rales, rhonchi, wheezing or stridor.  Cardio: RRR with no MRGs. Brisk peripheral pulses without edema.  Abdomen: Soft, + BS.  Non tender, no guarding, rebound, hernias, masses. Lymphatics: Non tender without lymphadenopathy.  Musculoskeletal: Full ROM, 5/5 strength, Normal gait Skin: Warm.  Appropriate color for ethnicity.  Neuro: Cranial nerves intact. No cerebellar symptoms.  Psych: Awake and oriented X 3, normal affect, Insight and Judgment appropriate.    Adela Glimpse, NP 4:18 PM [facility]

## 2023-10-19 LAB — HEMOGLOBIN A1C
Hgb A1c MFr Bld: 5.9 %{Hb} — ABNORMAL HIGH (ref <5.7)
Mean Plasma Glucose: 123 mg/dL
eAG (mmol/L): 6.8 mmol/L

## 2023-10-19 LAB — CBC WITH DIFFERENTIAL/PLATELET
Absolute Lymphocytes: 1850 {cells}/uL (ref 850–3900)
Absolute Monocytes: 698 {cells}/uL (ref 200–950)
Basophils Absolute: 58 {cells}/uL (ref 0–200)
Basophils Relative: 0.9 %
Eosinophils Absolute: 211 {cells}/uL (ref 15–500)
Eosinophils Relative: 3.3 %
HCT: 47.3 % (ref 38.5–50.0)
Hemoglobin: 15.6 g/dL (ref 13.2–17.1)
MCH: 30.3 pg (ref 27.0–33.0)
MCHC: 33 g/dL (ref 32.0–36.0)
MCV: 91.8 fL (ref 80.0–100.0)
MPV: 9 fL (ref 7.5–12.5)
Monocytes Relative: 10.9 %
Neutro Abs: 3584 {cells}/uL (ref 1500–7800)
Neutrophils Relative %: 56 %
Platelets: 341 10*3/uL (ref 140–400)
RBC: 5.15 10*6/uL (ref 4.20–5.80)
RDW: 11.9 % (ref 11.0–15.0)
Total Lymphocyte: 28.9 %
WBC: 6.4 10*3/uL (ref 3.8–10.8)

## 2023-10-19 LAB — COMPLETE METABOLIC PANEL WITH GFR
AG Ratio: 1.6 (calc) (ref 1.0–2.5)
ALT: 30 U/L (ref 9–46)
AST: 30 U/L (ref 10–35)
Albumin: 4.9 g/dL (ref 3.6–5.1)
Alkaline phosphatase (APISO): 69 U/L (ref 35–144)
BUN/Creatinine Ratio: 9 (calc) (ref 6–22)
BUN: 12 mg/dL (ref 7–25)
CO2: 30 mmol/L (ref 20–32)
Calcium: 10 mg/dL (ref 8.6–10.3)
Chloride: 97 mmol/L — ABNORMAL LOW (ref 98–110)
Creat: 1.31 mg/dL — ABNORMAL HIGH (ref 0.70–1.30)
Globulin: 3.1 g/dL (ref 1.9–3.7)
Glucose, Bld: 105 mg/dL (ref 65–139)
Potassium: 4 mmol/L (ref 3.5–5.3)
Sodium: 138 mmol/L (ref 135–146)
Total Bilirubin: 0.5 mg/dL (ref 0.2–1.2)
Total Protein: 8 g/dL (ref 6.1–8.1)
eGFR: 63 mL/min/{1.73_m2} (ref >=60)

## 2023-10-19 LAB — LIPID PANEL
Cholesterol: 103 mg/dL (ref <200)
HDL: 43 mg/dL (ref >=40)
LDL Cholesterol (Calc): 40 mg/dL
Non-HDL Cholesterol (Calc): 60 mg/dL (ref <130)
Total CHOL/HDL Ratio: 2.4 (calc) (ref <5.0)
Triglycerides: 110 mg/dL (ref <150)

## 2023-10-19 MED ORDER — NYSTATIN 100000 UNIT/GM EX CREA: 1.0000 | TOPICAL_CREAM | Freq: Two times a day (BID) | CUTANEOUS | 0 refills | Status: AC

## 2023-10-20 ENCOUNTER — Encounter: Payer: Self-pay | Admitting: Nurse Practitioner

## 2023-10-23 ENCOUNTER — Encounter: Payer: Self-pay | Admitting: Nurse Practitioner

## 2023-10-23 NOTE — Patient Instructions (Signed)

## 2023-11-25 ENCOUNTER — Other Ambulatory Visit: Payer: Self-pay

## 2023-11-25 MED ORDER — MOUNJARO 7.5 MG/0.5ML ~~LOC~~ SOAJ: 7.5000 mg | SUBCUTANEOUS | 2 refills | Status: AC

## 2024-01-11 ENCOUNTER — Other Ambulatory Visit: Payer: Self-pay | Admitting: Family Medicine

## 2024-01-11 ENCOUNTER — Encounter: Payer: Self-pay | Admitting: Family Medicine

## 2024-01-11 DIAGNOSIS — L03119 Cellulitis of unspecified part of limb: Secondary | ICD-10-CM

## 2024-01-12 ENCOUNTER — Ambulatory Visit
Admission: RE | Admit: 2024-01-12 | Discharge: 2024-01-12 | Disposition: A | Discharge status: Home / Self Care | Payer: Self-pay | Source: Ambulatory Visit | Attending: Family Medicine | Admitting: Family Medicine

## 2024-01-12 DIAGNOSIS — L03119 Cellulitis of unspecified part of limb: Secondary | ICD-10-CM

## 2024-01-12 MED ORDER — GADOPICLENOL 0.5 MMOL/ML IV SOLN
10.0000 mL | Freq: Once | INTRAVENOUS | Status: AC | PRN
  Administered 2024-01-12: 10 mL via INTRAVENOUS

## 2024-01-17 ENCOUNTER — Ambulatory Visit: Payer: 59 | Admitting: Internal Medicine

## 2024-02-15 ENCOUNTER — Other Ambulatory Visit: Payer: Self-pay

## 2024-02-15 DIAGNOSIS — I872 Venous insufficiency (chronic) (peripheral): Secondary | ICD-10-CM

## 2024-02-15 DIAGNOSIS — I739 Peripheral vascular disease, unspecified: Secondary | ICD-10-CM

## 2024-03-01 NOTE — Progress Notes (Signed)
 Patient ID: Donald Schwartz, male   DOB: [birth year], 58 y.o.   MRN: [id number]  Reason for Consult: New Patient (Initial Visit)   Referred by Ransom Byers, *  Subjective:     HPI Donald Schwartz is a 58 y.o. male presenting for evaluation of left leg cellulitis.  He reports he has had about 3 episodes of cellulitis of his life and most recently his last episode took about 6 weeks to heal.  He does have chronic swelling and works as a Curator but wears compression stockings regularly.  He also elevates with a wedge pillow regularly.  He has also been started on torsemide  back in December which is greatly improved his swelling. He denies claudication, rest pain or history of nonhealing wounds.  He is a former smoker and quit in the 90s.  Past Medical History:  Diagnosis Date   Allergy    Arthritis    Cellulitis    Elevated hemoglobin A1c    Elevated LDL cholesterol level    Hx of colonic polyp 11/20/2014   Hyperlipidemia    Hypertension    Hypogonadism male    Neuromuscular disorder (HCC)    Trigeminar neuralgia   Sleep apnea    wears CPAP   Vitamin D  deficiency    Family History  Problem Relation Age of Onset   Prostate cancer Father    Colon cancer Maternal Uncle    Prostate cancer Maternal Uncle    Colon cancer Paternal Uncle    Colon cancer Maternal Grandfather    Colon cancer Paternal Grandfather    Stomach cancer Neg Hx    Esophageal cancer Neg Hx    Rectal cancer Neg Hx    Past Surgical History:  Procedure Laterality Date   c5-6 fusion     2012   COLONOSCOPY  01/30/2018   Dr.Gessner   HIP FRACTURE SURGERY Right 1986   avulsion/chip fracture ?stress fracture   POLYPECTOMY      Short Social History:  Social History   Tobacco Use   Smoking status: Former    Current packs/day: 0.00    Types: Cigarettes    Quit date: 09/20/1989    Years since quitting: 34.4   Smokeless tobacco: Never  Substance Use Topics   Alcohol use: No     Alcohol/week: 0.0 standard drinks of alcohol    Allergies  Allergen Reactions   Toprol Xl [Metoprolol Tartrate]     ED    Current Outpatient Medications  Medication Sig Dispense Refill   Ascorbic Acid (VITAMIN C) 500 MG CAPS Take 1 capsule by mouth daily.     aspirin  81 MG chewable tablet Chew 1 tablet (81 mg total) by mouth daily.     Cholecalciferol (VITAMIN D  PO) Take 10,000 Units by mouth daily.      diltiazem  (CARDIZEM ) 120 MG tablet Take  1 tablet  Daily for BP 90 tablet 3   fluticasone (FLONASE) 50 MCG/ACT nasal spray USE ONE SPRAY EACH NOSTRIL TWICE DAILY 16 g 3   ipratropium (ATROVENT ) 0.06 % nasal spray Use 1 to 2 sprays each nostril 2 to 3 x /day as needed 45 mL 3   Magnesium 250 MG TABS Take 500 mg by mouth 2 (two) times a day.      meloxicam  (MOBIC ) 15 MG tablet Take  1/2 to 1 tablet  Daily  with Food  for Pain /Inflammation & try limit to 5 days /week to Avoid Kidney Damage 90 tablet 1  Multiple Vitamins-Minerals (MULTIVITAMIN MEN PO) Take by mouth daily.     nystatin  cream (MYCOSTATIN ) Apply 1 Application topically 2 (two) times daily. 30 g 0   olmesartan  (BENICAR ) 20 MG tablet Take  1 tablet  at Night  for BP 90 tablet 3   Omega-3 Fatty Acids (FISH OIL) 1000 MG CAPS Take 1,200 mg by mouth. Take two tablets in the morning and two tablets in the evening     oxcarbazepine  (TRILEPTAL ) 600 MG tablet Take 1 tablet (600 mg total) by mouth 2 (two) times daily. 60 tablet 11   rizatriptan  (MAXALT ) 10 MG tablet Take 1 tablet (10 mg total) by mouth as needed for migraine. May repeat in 2 hours if needed.  Maximum 2 tablets in 24 hours. 10 tablet 5   rosuvastatin  (CRESTOR ) 10 MG tablet TAKE 1 TABLET BY MOUTH ONCE DAILY FOR CHOLESTEROL 90 tablet 2   sildenafil  (VIAGRA ) 100 MG tablet 1/2-1 pill as needed 1 hour before intercourse, can get with good RX from [employer] 10 tablet 2   tirzepatide  (MOUNJARO ) 7.5 MG/0.5ML Pen Inject 7.5 mg into the skin once a week. 4 mL 2   torsemide   (DEMADEX ) 20 MG tablet TAKE 1 TABLET BY MOUTH ONCE DAILY TO TWICE DAILY FOR  EDEMA 180 tablet 2   zinc gluconate 50 MG tablet Take 50 mg by mouth daily.     No current facility-administered medications for this visit.    REVIEW OF SYSTEMS  All other systems were reviewed and are negative     Objective:  Objective   Vitals:   03/02/24 0920  BP: 115/76  Pulse: 72  Resp: 18  Temp: 97.7 F (36.5 C)  TempSrc: Temporal  SpO2: 96%  Weight: 245 lb 14.4 oz (111.5 kg)  Height: 6' (1.829 m)   Body mass index is 33.35 kg/m.  Physical Exam General: no acute distress Cardiac: hemodynamically stable Pulm: normal work of breathing Abdomen: non-tender, no pulsatile mass Neuro: alert, no focal deficit Extremities: Minimal edema bilaterally, cyanosis or wounds.  Right arm in sling from recent rotator cuff surgery Vascular:   Right: Palpable femoral, DP  Left: Palpable femoral, DP  Data: ABI +---------+------------------+-----+-----------+--------+  Right   Rt Pressure (mmHg)IndexWaveform   Comment   +---------+------------------+-----+-----------+--------+  Brachial 109                                         +---------+------------------+-----+-----------+--------+  PTA     133               1.13 multiphasic          +---------+------------------+-----+-----------+--------+  DP      133               1.13 triphasic            +---------+------------------+-----+-----------+--------+  Great Toe106               0.90 Normal               +---------+------------------+-----+-----------+--------+   +---------+------------------+-----+-----------+-------+  Left    Lt Pressure (mmHg)IndexWaveform   Comment  +---------+------------------+-----+-----------+-------+  Brachial 118                                        +---------+------------------+-----+-----------+-------+  PTA  124               1.05 multiphasic          +---------+------------------+-----+-----------+-------+  DP      121               1.03 triphasic           +---------+------------------+-----+-----------+-------+  Great Toe110               0.93 Normal              +---------+------------------+-----+-----------+-------+   +-------+-----------+-----------+------------+------------+  ABI/TBIToday's ABIToday's TBIPrevious ABIPrevious TBI  +-------+-----------+-----------+------------+------------+  Right 1.13       .90                                  +-------+-----------+-----------+------------+------------+  Left  1.05       .93                                  +-------+-----------+-----------+------------+------------+   Reflux study +--------------+---------+------+-----------+------------+--------+  LEFT         Reflux NoRefluxReflux TimeDiameter cmsComments                          Yes                                   +--------------+---------+------+-----------+------------+--------+  CFV          no                                              +--------------+---------+------+-----------+------------+--------+  FV prox       no                                              +--------------+---------+------+-----------+------------+--------+  Popliteal    no                                              +--------------+---------+------+-----------+------------+--------+  GSV at SFJ    no                            .85               +--------------+---------+------+-----------+------------+--------+  GSV prox thighno                            .49               +--------------+---------+------+-----------+------------+--------+  GSV mid thigh           yes    >500 ms      .30               +--------------+---------+------+-----------+------------+--------+  GSV dist thigh          yes    >500 ms      .  37                +--------------+---------+------+-----------+------------+--------+  GSV at knee   no                            .42               +--------------+---------+------+-----------+------------+--------+  GSV prox calf no                            .37               +--------------+---------+------+-----------+------------+--------+  GSV mid calf  no                            .26               +--------------+---------+------+-----------+------------+--------+  GSV dist calf no                            .22               +--------------+---------+------+-----------+------------+--------+  [employer]    no                            .18               +--------------+---------+------+-----------+------------+--------+  SSV prox calf no                            .21               +--------------+---------+------+-----------+------------+--------+  SSV mid calf  no                            .15               +--------------+---------+------+-----------+------------+--------+    Summary:  Left:  - No evidence of deep vein thrombosis seen in the left lower extremity,  from the common femoral through the popliteal veins.  - No evidence of superficial venous thrombosis in the left lower  extremity.  - Venous reflux is noted in the left greater saphenous vein in the thigh.  - Rouleau flow noted in the CFV, DFV, FV, popliteal vein and GSV.  - Avascular cystic structure in the popliteal fossa consistent with  Baker's cyst.   A1c 5.9  CMP reviewed Cr 1.31     Assessment/Plan:   Donald Schwartz is a 58 y.o. male with chronic venous insufficiency.  We discussed the foundation of treatment of compression and elevation.  He explained that his swelling has been reasonably well-controlled.  I also explained that he does not have peripheral arterial disease as he has palpable pulses and a normal ABI.  We discussed that his episodes of cellulitis are  likely unrelated to any vasculogenic cause as he only has a short segment of reflux in the left GSV in the thigh and does not have significant weeping edema. Follow-up as needed    Philipp Brawn MD [facility]

## 2024-03-02 ENCOUNTER — Ambulatory Visit (HOSPITAL_COMMUNITY)
Admission: RE | Admit: 2024-03-02 | Discharge: 2024-03-02 | Disposition: A | Discharge status: Home / Self Care | Source: Ambulatory Visit | Attending: Vascular Surgery

## 2024-03-02 ENCOUNTER — Ambulatory Visit: Attending: Vascular Surgery | Admitting: Vascular Surgery

## 2024-03-02 ENCOUNTER — Encounter: Payer: Self-pay | Admitting: Vascular Surgery

## 2024-03-02 ENCOUNTER — Other Ambulatory Visit: Payer: Self-pay | Admitting: Vascular Surgery

## 2024-03-02 VITALS — BP 115/76 | HR 72 | Temp 97.7°F | Resp 18 | Ht 72.0 in | Wt 245.9 lb

## 2024-03-02 DIAGNOSIS — I739 Peripheral vascular disease, unspecified: Secondary | ICD-10-CM

## 2024-03-02 DIAGNOSIS — I872 Venous insufficiency (chronic) (peripheral): Secondary | ICD-10-CM | POA: Diagnosis not present

## 2024-03-02 LAB — VAS US ABI WITH/WO TBI
Left ABI: 1.05
Right ABI: 1.13

## 2024-03-21 ENCOUNTER — Encounter (HOSPITAL_BASED_OUTPATIENT_CLINIC_OR_DEPARTMENT_OTHER): Payer: Self-pay | Admitting: Physical Therapy

## 2024-03-21 ENCOUNTER — Other Ambulatory Visit: Payer: Self-pay

## 2024-03-21 ENCOUNTER — Ambulatory Visit (HOSPITAL_BASED_OUTPATIENT_CLINIC_OR_DEPARTMENT_OTHER): Attending: Orthopedic Surgery | Admitting: Physical Therapy

## 2024-03-21 DIAGNOSIS — M25611 Stiffness of right shoulder, not elsewhere classified: Secondary | ICD-10-CM | POA: Diagnosis present

## 2024-03-21 DIAGNOSIS — M25511 Pain in right shoulder: Secondary | ICD-10-CM | POA: Insufficient documentation

## 2024-03-21 DIAGNOSIS — M6281 Muscle weakness (generalized): Secondary | ICD-10-CM | POA: Diagnosis present

## 2024-03-21 NOTE — Therapy (Signed)
 OUTPATIENT PHYSICAL THERAPY UPPER EXTREMITY EVALUATION   Patient Name: Donald Schwartz MRN: [id number] DOB:[birth year], 58 y.o., male Today's Date: 03/21/2024  END OF SESSION:  PT End of Session - 03/21/24 0854     Visit Number 1    Number of Visits 21    Date for PT Re-Evaluation 06/19/24    Authorization Type [place]    PT Start Time [phone]    PT Stop Time 0930    PT Time Calculation (min) 44 min    Activity Tolerance Patient tolerated treatment well    Behavior During Therapy [facility] for tasks assessed/performed          Past Medical History:  Diagnosis Date   Allergy    Arthritis    Cellulitis    Elevated hemoglobin A1c    Elevated LDL cholesterol level    Hx of colonic polyp 11/20/2014   Hyperlipidemia    Hypertension    Hypogonadism male    Neuromuscular disorder (HCC)    Trigeminar neuralgia   Sleep apnea    wears CPAP   Vitamin D  deficiency    Past Surgical History:  Procedure Laterality Date   c5-6 fusion     2012   COLONOSCOPY  01/30/2018   Dr.Gessner   HIP FRACTURE SURGERY Right 1986   avulsion/chip fracture ?stress fracture   POLYPECTOMY     Patient Active Problem List   Diagnosis Date Noted   Fatty liver 11/26/2020   Morbid obesity (HCC) - BMI 30+ with OSA 02/03/2016   Medication management 01/10/2015   Trigeminal neuralgia of right side of face 01/10/2015   Hx of colonic polyp + FHx CRCA 11/20/2014   CKD (chronic kidney disease) stage 2, GFR 60-89 ml/min 10/11/2014   OSA on CPAP 01/07/2014   Vitamin D  deficiency 01/06/2014   Hypertension    Hyperlipidemia    Other abnormal glucose (prediabetes)     PCP:   Chrystal Lamarr RAMAN, MD    REFERRING PROVIDER: Canda Franky Jurist, MD   REFERRING DIAG:  [phone] (ICD-10-CM) - Complete rotator cuff tear or rupture of right shoulder, not specified as traumatic      THERAPY DIAG:  Decreased right shoulder range of motion  Pain in joint of right shoulder  Muscle weakness  (generalized)  Rationale for Evaluation and Treatment: Rehabilitation  ONSET DATE: 02/03/2024 DOS  s/p Right shoulder  arthroscopic supraspinatus and subscapularis tendon repair  (per office visit note)   SUBJECTIVE:  SUBJECTIVE STATEMENT:  Pt states its was a moderate sized tear. MD repaired rotator cuff, debrided the shoulder for the bursitis and does not report biceps tendon procedure. Dr. Canda is Careers adviser. Pt just recently came out of the sling this week after virtual visit with PA. He has been doing pendulums, table slides, and wall walks- 4x/day for 10 reps. Pt is sleeping in bed now and is back sleeper.  Incisions are closed and well healed.   Hand dominance: Right  PERTINENT HISTORY: HLD, HTN, recurrent cellulitis, CKD  PAIN:  Are you having pain? Yes: NPRS scale: 1/10 at rest; worst 6/10 Pain location: posterior shoulder, biceps Pain description: aching/ sore/Nemes Aggravating factors: movement Relieving factors: rest, meds, icing  PRECAUTIONS: Shoulder  RED FLAGS: None   WEIGHT BEARING RESTRICTIONS: Yes WBAT now at 6 wks post op  FALLS:  Has patient fallen in last 6 months? 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  DIAGNOSTIC FINDINGS:  N/A in chart; MRI performed at [facility]  PATIENT SURVEYS :  UEFS  Extreme difficulty/unable (0), Quite a bit of difficulty (1), Moderate difficulty (2), Little difficulty (3), No difficulty (4) Survey date:  eval  Any of  your usual work, household or school activities 1  2. Your usual hobbies, recreational/sport activities 1   3. Lifting a bag of groceries to waist level 2   4. Lifting a bag of groceries above your head 3  5. Grooming your hair 2  6. Pushing up on your hands (I.e. from bathtub or chair) 1  7. Preparing food (I.e. peeling/cutting) 2  8. Driving  0  9. Vacuuming, sweeping, or raking 2  10. Dressing  3  11. Doing up buttons 4  12. Using tools/appliances 2  13. Opening doors 3  14. Cleaning  3  15. Tying or lacing shoes 4  16. Sleeping  3  17. Laundering clothes (I.e. washing, ironing, folding) 4  18. Opening a jar 1  19. Throwing a ball 1  20. Carrying a small suitcase with your affected limb.  2  Score total:  44      COGNITION: Overall cognitive status: Within functional limits for tasks assessed     SENSATION: WFL  POSTURE: Mild kyphosis, rounded shoulders, guarded R shoulder with shrugging position  UPPER EXTREMITY ROM:   Active ROM Right eval Left eval  Shoulder flexion 130 155  Shoulder extension    Shoulder abduction 140 155  Shoulder adduction    Shoulder internal rotation To belly To belly  Shoulder external rotation 30 60  Elbow flexion    Elbow extension    Wrist flexion    Wrist extension    Wrist ulnar deviation    Wrist radial deviation    Wrist pronation    Wrist supination    (Blank rows = not tested)  UPPER EXTREMITY MMT: not tested 2/2 to surgical healing precautions   JOINT MOBILITY TESTING:  R GHJ stiffness as expected in inf and post post op  PALPATION:  No TTP along deltoid or biceps Hypertonicity of biceps belly  TREATMENT DATE: 7/2   Exercises - Standing Shoulder Abduction AAROM with Dowel  - 2 x daily - 7 x weekly - 2 sets - 10 reps - Shoulder Flexion Overhead with Dowel  - 2 x daily - 7 x weekly - 2 sets  - 10 reps - Shoulder Scaption AAROM with Dowel  - 2 x daily - 7 x weekly - 2 sets - 10 reps - Bicep Stretch at Table  - 2 x daily - 7 x weekly - 1 sets - 3 reps - 30 hold - Standing Shoulder Flexion Wall Walk  - 2 x daily - 7 x weekly - 2 sets - 10 reps    PATIENT EDUCATION: Education details: surgical protocol, signs of infection, cryotherapy, edema management, joint protection, diagnosis, prognosis, anatomy, exercise progression, DOMS expectations, muscle firing,  envelope of function, HEP, POC Person educated: Patient Education method: Explanation, Demonstration, Tactile cues, Verbal cues, and Handouts Education comprehension: verbalized understanding, returned demonstration, verbal cues required, tactile cues required, and needs further education     HOME EXERCISE PROGRAM:    Access Code: [email or web address] URL: [email or web address] Date: 03/21/2024 Prepared by: Dale Call      ASSESSMENT:   CLINICAL IMPRESSION:   Patient is a 58y.o. male who was seen today for physical therapy evaluation and treatment for s/p R RCR and limited debridement. Surgical note is not in chart but requested.  Pt with expected ROM and strength deficits following surgery. Wound is clean, dry, and intact. No s/s of infections or DVT. Pt gave verbal understanding to edu regarding surgical precautions. Plan to continue with RCR protocol at future visits. Pt would benefit from continued skilled therapy in order to reach goals and maximize functional R UE strength and ROM for full return to PLOF. SABRA        OBJECTIVE IMPAIRMENTS decreased activity tolerance, decreased ROM, decreased strength, increased muscle spasms, impaired UE functional use, improper body mechanics, postural dysfunction, and pain.    ACTIVITY LIMITATIONS cleaning, lifting, bending, carry, dressing, bathing, feeding, community activity, meal prep, laundry, and yard work.    PERSONAL FACTORS Time since onset of  injury/illness/exacerbation is also affecting patient's functional outcome.     REHAB POTENTIAL: Good   CLINICAL DECISION MAKING: Stable/uncomplicated   EVALUATION COMPLEXITY: Low     GOALS:     SHORT TERM GOALS: Target date: 05/02/2024        Pt will become independent with HEP in order to demonstrate synthesis of PT education.   Goal status: INITIAL   2.  Pt will be able to demonstrate full PROM  in order to demonstrate functional improvement in UE for progression to next phase of protocol.     Goal status: INITIAL   3.  Pt will report at least 2 pt reduction on NPRS scale for pain in order to demonstrate functional improvement with household activity, self care, and ADL.    Goal status: INITIAL       LONG TERM GOALS: Target date: 06/19/2024       Pt  will become independent with final HEP in order to demonstrate synthesis of PT education.   Goal status: INITIAL   2.  Pt will be able to reach [facility] and carry/hold >15lbs in order to demonstrate functional improvement in R UE strength for return to household duties and ADL.SABRA    Goal status: INITIAL   3.  Pt will be able to demonstrate full [place] AROM of the R shoulder in order to demonstrate functional  improvement in UE function for self-care and house hold duties.    Goal status: INITIAL   4.  Pt will test to within 80% HHD of L UE in order to demonstrate functional  UE strength improvement and limb symmetry.     Goal status: INITIAL      PLAN: PT FREQUENCY: 1-2x/week   PT DURATION: 12 weeks   PLANNED INTERVENTIONS: Therapeutic exercises, Therapeutic activity, Neuromuscular re-education, Patient/Family education, Joint mobilization, Dry Needling, Spinal mobilization, Cryotherapy, Moist heat, Taping, and Manual therapy, Re-evaluation   PLAN FOR NEXT SESSION:  A/AROM per [facility] repair protocol limits; consider rowing, review HEP     Dale Call, PT 03/21/2024, 12:50 PM

## 2024-03-28 ENCOUNTER — Ambulatory Visit (HOSPITAL_BASED_OUTPATIENT_CLINIC_OR_DEPARTMENT_OTHER): Admitting: Physical Therapy

## 2024-03-28 ENCOUNTER — Encounter (HOSPITAL_BASED_OUTPATIENT_CLINIC_OR_DEPARTMENT_OTHER): Payer: Self-pay | Admitting: Physical Therapy

## 2024-03-28 DIAGNOSIS — M25511 Pain in right shoulder: Secondary | ICD-10-CM

## 2024-03-28 DIAGNOSIS — M6281 Muscle weakness (generalized): Secondary | ICD-10-CM

## 2024-03-28 DIAGNOSIS — M25611 Stiffness of right shoulder, not elsewhere classified: Secondary | ICD-10-CM | POA: Diagnosis not present

## 2024-03-28 NOTE — Therapy (Signed)
 OUTPATIENT PHYSICAL THERAPY UPPER EXTREMITY EVALUATION   Patient Name: Donald Schwartz MRN: [id number] DOB:11-Mar-1966, 58 y.o., male Today's Date: 03/28/2024  END OF SESSION:  PT End of Session - 03/28/24 0806     Visit Number 2    Number of Visits 21    Date for PT Re-Evaluation 06/19/24    Authorization Type [place]    PT Start Time 0800    PT Stop Time 0838    PT Time Calculation (min) 38 min    Activity Tolerance Patient tolerated treatment well    Behavior During Therapy [facility] for tasks assessed/performed          Past Medical History:  Diagnosis Date   Allergy    Arthritis    Cellulitis    Elevated hemoglobin A1c    Elevated LDL cholesterol level    Hx of colonic polyp 11/20/2014   Hyperlipidemia    Hypertension    Hypogonadism male    Neuromuscular disorder (HCC)    Trigeminar neuralgia   Sleep apnea    wears CPAP   Vitamin D  deficiency    Past Surgical History:  Procedure Laterality Date   c5-6 fusion     2012   COLONOSCOPY  01/30/2018   Dr.Gessner   HIP FRACTURE SURGERY Right 1986   avulsion/chip fracture ?stress fracture   POLYPECTOMY     Patient Active Problem List   Diagnosis Date Noted   Fatty liver 11/26/2020   Morbid obesity (HCC) - BMI 30+ with OSA 02/03/2016   Medication management 01/10/2015   Trigeminal neuralgia of right side of face 01/10/2015   Hx of colonic polyp + FHx CRCA 11/20/2014   CKD (chronic kidney disease) stage 2, GFR 60-89 ml/min 10/11/2014   OSA on CPAP 01/07/2014   Vitamin D  deficiency 01/06/2014   Hypertension    Hyperlipidemia    Other abnormal glucose (prediabetes)     PCP:   Chrystal Lamarr RAMAN, MD    REFERRING PROVIDER: Canda Franky Jurist, MD   REFERRING DIAG:  [phone] (ICD-10-CM) - Complete rotator cuff tear or rupture of right shoulder, not specified as traumatic      THERAPY DIAG:  Decreased right shoulder range of motion  Pain in joint of right shoulder  Muscle weakness  (generalized)  Rationale for Evaluation and Treatment: Rehabilitation  ONSET DATE: 02/03/2024 DOS  s/p Right shoulder  arthroscopic supraspinatus and subscapularis tendon repair  (per office visit note)   SUBJECTIVE:  SUBJECTIVE STATEMENT:  Pt reports mild soreness from previous session and without pain. Pt does report lifting a bag of bird seed with the L UE and feeling pressure/strain on the R. (Pt advised to avoid heavy lifting with L UE as EMG evidence does show strong co-contraction of surgical side)    Eval: Pt states its was a moderate sized tear. MD repaired rotator cuff, debrided the shoulder for the bursitis and does not report biceps tendon procedure. Dr. Canda is Careers adviser. Pt just recently came out of the sling this week after virtual visit with PA. He has been doing pendulums, table slides, and wall walks- 4x/day for 10 reps. Pt is sleeping in bed now and is back sleeper.  Incisions are closed and well healed.   Hand dominance: Right  PERTINENT HISTORY: HLD, HTN, recurrent cellulitis, CKD  PAIN:  Are you having pain? Yes: NPRS scale: 1/10 at rest; worst 6/10 Pain location: posterior shoulder, biceps Pain description: aching/ sore/[place] Aggravating factors: movement Relieving factors: rest, meds, icing  PRECAUTIONS: Shoulder  RED FLAGS: None   WEIGHT BEARING RESTRICTIONS: Yes WBAT now at 6 wks post op  FALLS:  Has patient fallen in last 6 months? 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  DIAGNOSTIC FINDINGS:  N/A in chart; MRI performed at [facility]  PATIENT SURVEYS :  UEFS  Extreme difficulty/unable (0), Quite a bit of difficulty (1), Moderate difficulty (2), Little difficulty (3), No difficulty (4) Survey date:  eval  Any of your usual work, household or school activities 1  2. Your usual hobbies, recreational/sport activities 1   3. Lifting a bag of groceries to waist level 2   4. Lifting a bag of groceries above your head 3  5. Grooming your hair 2  6. Pushing up on your hands (I.e. from bathtub or chair) 1  7. Preparing food (I.e. peeling/cutting) 2  8. Driving  0  9. Vacuuming, sweeping, or raking 2  10. Dressing  3  11. Doing up buttons 4  12. Using tools/appliances 2  13. Opening doors 3  14. Cleaning  3  15. Tying or lacing shoes 4  16. Sleeping  3  17. Laundering clothes (I.e. washing, ironing, folding) 4  18. Opening a jar 1  19. Throwing a ball 1  20. Carrying a small suitcase with your affected limb.  2  Score total:  44      COGNITION: Overall cognitive status: Within functional limits for tasks assessed     SENSATION: WFL  POSTURE: Mild kyphosis, rounded shoulders, guarded R shoulder with shrugging position  UPPER EXTREMITY ROM:   Active ROM Right eval Left eval  Shoulder flexion 130 155  Shoulder extension    Shoulder abduction 140 155  Shoulder adduction    Shoulder internal rotation To belly To belly  Shoulder external rotation 30 60  Elbow flexion    Elbow extension    Wrist flexion    Wrist extension    Wrist ulnar deviation    Wrist radial deviation    Wrist pronation    Wrist supination    (Blank rows = not tested)  UPPER EXTREMITY MMT: not tested  2/2 to surgical healing precautions   JOINT MOBILITY TESTING:  R GHJ stiffness as expected in inf and post post op  PALPATION:  No TTP along deltoid or biceps Hypertonicity of biceps belly                                                                                                                                TREATMENT DATE:   7/9  Pulley warm up 2 min flexion and ABD  YTB rowing 3x10 Scaption 2x10 AROM Flexion AROM 2x10 Doorway pec arm low 30s 3x Bent over T 2x10 Short lever shoulder ABD in standing 2x10 AROM ER 2x10 standing bilat   7/2   Exercises - Standing Shoulder Abduction AAROM with Dowel  - 2 x daily - 7 x weekly - 2 sets - 10 reps - Shoulder Flexion Overhead with Dowel  - 2 x daily - 7 x weekly - 2 sets - 10 reps - Shoulder Scaption AAROM with Dowel  - 2 x daily - 7 x weekly - 2 sets - 10 reps - Bicep Stretch at Table  - 2 x daily - 7 x weekly - 1 sets - 3 reps - 30 hold - Standing Shoulder Flexion Wall Walk  - 2 x daily - 7 x weekly - 2 sets - 10 reps    PATIENT EDUCATION: Education details: surgical protocol, signs of infection, cryotherapy, edema management, joint protection, diagnosis, prognosis, anatomy, exercise progression, DOMS expectations, muscle firing,  envelope of function, HEP, POC Person educated: Patient Education method: Explanation, Demonstration, Tactile cues, Verbal cues, and Handouts Education comprehension: verbalized understanding, returned demonstration, verbal cues required, tactile cues required, and needs further education     HOME EXERCISE PROGRAM:    Access Code: [email or web address] URL: [email or web address] Date: 03/21/2024 Prepared by: Dale Call      ASSESSMENT:   CLINICAL IMPRESSION:   Patient's 7, nearly 8 weeks postop right supraspinatus and subscapularis rotator cuff repair.  Patient able to progress active range of motion today without pain and without discomfort.  Patient demonstrates great active range of motion without upper trap compensation and without excessive, unexpected joint stiffness.  Home exercise program updated today for active range of motion in full tolerable range as well as scapular resistance and strengthening exercise.   Plan to continue with progression of active range of motion and consider antigravity external rotation, internal rotation, and abduction at future sessions.  Patient advised on expected soreness as well as postexercise pain management.  Plan to continue with rotator cuff repair protocol as tolerated.   Plan to continue with RCR protocol at future visits. Pt would benefit from continued skilled therapy in order to reach goals and maximize functional R UE strength and ROM for full return to PLOF.      Surgical note has been scanned into patient's medical chart.   OBJECTIVE IMPAIRMENTS decreased activity tolerance, decreased ROM, decreased strength, increased muscle spasms, impaired UE functional use,  improper body mechanics, postural dysfunction, and pain.    ACTIVITY LIMITATIONS cleaning, lifting, bending, carry, dressing, bathing, feeding, community activity, meal prep, laundry, and yard work.    PERSONAL FACTORS Time since onset of injury/illness/exacerbation is also affecting patient's functional outcome.     REHAB POTENTIAL: Good   CLINICAL DECISION MAKING: Stable/uncomplicated   EVALUATION COMPLEXITY: Low     GOALS:     SHORT TERM GOALS: Target date: 05/02/2024        Pt will become independent with HEP in order to demonstrate synthesis of PT education.   Goal status: INITIAL   2.  Pt will be able to demonstrate full PROM  in order to demonstrate functional improvement in UE for progression to next phase of protocol.     Goal status: INITIAL   3.  Pt will report at least 2 pt reduction on NPRS scale for pain in order to demonstrate functional improvement with household activity, self care, and ADL.    Goal status: INITIAL       LONG TERM GOALS: Target date: 06/19/2024       Pt  will become independent with final HEP in order to demonstrate synthesis of PT education.   Goal status: INITIAL   2.  Pt will be able to reach [facility] and carry/hold >15lbs in order to demonstrate  functional improvement in R UE strength for return to household duties and ADL.SABRA    Goal status: INITIAL   3.  Pt will be able to demonstrate full OH AROM of the R shoulder in order to demonstrate functional improvement in UE function for self-care and house hold duties.    Goal status: INITIAL   4.  Pt will test to within 80% HHD of L UE in order to demonstrate functional  UE strength improvement and limb symmetry.     Goal status: INITIAL      PLAN: PT FREQUENCY: 1-2x/week   PT DURATION: 12 weeks   PLANNED INTERVENTIONS: Therapeutic exercises, Therapeutic activity, Neuromuscular re-education, Patient/Family education, Joint mobilization, Dry Needling, Spinal mobilization, Cryotherapy, Moist heat, Taping, and Manual therapy, Re-evaluation   PLAN FOR NEXT SESSION:  A/AROM per [facility] repair protocol limits; consider rowing, review HEP     Dale Call, PT 03/28/2024, 8:44 AM

## 2024-04-02 ENCOUNTER — Encounter (HOSPITAL_BASED_OUTPATIENT_CLINIC_OR_DEPARTMENT_OTHER): Payer: Self-pay

## 2024-04-02 ENCOUNTER — Ambulatory Visit (HOSPITAL_BASED_OUTPATIENT_CLINIC_OR_DEPARTMENT_OTHER)

## 2024-04-02 DIAGNOSIS — M25611 Stiffness of right shoulder, not elsewhere classified: Secondary | ICD-10-CM

## 2024-04-02 DIAGNOSIS — M6281 Muscle weakness (generalized): Secondary | ICD-10-CM

## 2024-04-02 DIAGNOSIS — M25511 Pain in right shoulder: Secondary | ICD-10-CM

## 2024-04-02 NOTE — Therapy (Signed)
 OUTPATIENT PHYSICAL THERAPY UPPER EXTREMITY TREATMENT   Patient Name: Donald Schwartz MRN: [id number] DOB:December 31, 1965, 58 y.o., male Today's Date: 04/02/2024  END OF SESSION:  PT End of Session - 04/02/24 1341     Visit Number 3    Number of Visits 21    Date for PT Re-Evaluation 06/19/24    Authorization Type [place]    PT Start Time 1347    PT Stop Time 1430    PT Time Calculation (min) 43 min    Activity Tolerance Patient tolerated treatment well    Behavior During Therapy [facility] for tasks assessed/performed           Past Medical History:  Diagnosis Date   Allergy    Arthritis    Cellulitis    Elevated hemoglobin A1c    Elevated LDL cholesterol level    Hx of colonic polyp 11/20/2014   Hyperlipidemia    Hypertension    Hypogonadism male    Neuromuscular disorder (HCC)    Trigeminar neuralgia   Sleep apnea    wears CPAP   Vitamin D  deficiency    Past Surgical History:  Procedure Laterality Date   c5-6 fusion     2012   COLONOSCOPY  01/30/2018   Dr.Gessner   HIP FRACTURE SURGERY Right 1986   avulsion/chip fracture ?stress fracture   POLYPECTOMY     Patient Active Problem List   Diagnosis Date Noted   Fatty liver 11/26/2020   Morbid obesity (HCC) - BMI 30+ with OSA 02/03/2016   Medication management 01/10/2015   Trigeminal neuralgia of right side of face 01/10/2015   Hx of colonic polyp + FHx CRCA 11/20/2014   CKD (chronic kidney disease) stage 2, GFR 60-89 ml/min 10/11/2014   OSA on CPAP 01/07/2014   Vitamin D  deficiency 01/06/2014   Hypertension    Hyperlipidemia    Other abnormal glucose (prediabetes)     PCP:   Chrystal Lamarr RAMAN, MD    REFERRING PROVIDER: Canda Franky Jurist, MD   REFERRING DIAG:  [phone] (ICD-10-CM) - Complete rotator cuff tear or rupture of right shoulder, not specified as traumatic      THERAPY DIAG:  Decreased right shoulder range of motion  Pain in joint of right shoulder  Muscle weakness  (generalized)  Rationale for Evaluation and Treatment: Rehabilitation  ONSET DATE: 02/03/2024 DOS  s/p Right shoulder  arthroscopic supraspinatus and subscapularis tendon repair  (per office visit note)   SUBJECTIVE:  SUBJECTIVE STATEMENT:  Pt reports no pain at entry. I haven't been lifting anything with that arm. Pt is sleeping better at night.     Eval: Pt states its was a moderate sized tear. MD repaired rotator cuff, debrided the shoulder for the bursitis and does not report biceps tendon procedure. Dr. Canda is Careers adviser. Pt just recently came out of the sling this week after virtual visit with PA. He has been doing pendulums, table slides, and wall walks- 4x/day for 10 reps. Pt is sleeping in bed now and is back sleeper.  Incisions are closed and well healed.   Hand dominance: Right  PERTINENT HISTORY: HLD, HTN, recurrent cellulitis, CKD  PAIN:  Are you having pain? Yes: NPRS scale: 1/10 at rest; worst 6/10 Pain location: posterior shoulder, biceps Pain description: aching/ sore/[place] Aggravating factors: movement Relieving factors: rest, meds, icing  PRECAUTIONS: Shoulder  RED FLAGS: None   WEIGHT BEARING RESTRICTIONS: Yes WBAT now at 6 wks post op  FALLS:  Has patient fallen in last 6 months? 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  DIAGNOSTIC FINDINGS:  N/A in chart; MRI performed at [facility]  PATIENT SURVEYS :  UEFS  Extreme difficulty/unable (0),  Quite a bit of difficulty (1), Moderate difficulty (2), Little difficulty (3), No difficulty (4) Survey date:  eval  Any of your usual work, household or school activities 1  2. Your usual hobbies, recreational/sport activities 1   3. Lifting a bag of groceries to waist level 2   4. Lifting a bag of groceries above your head 3  5. Grooming your hair 2  6. Pushing up on your hands (I.e. from bathtub or chair) 1  7. Preparing food (I.e. peeling/cutting) 2  8. Driving  0  9. Vacuuming, sweeping, or raking 2  10. Dressing  3  11. Doing up buttons 4  12. Using tools/appliances 2  13. Opening doors 3  14. Cleaning  3  15. Tying or lacing shoes 4  16. Sleeping  3  17. Laundering clothes (I.e. washing, ironing, folding) 4  18. Opening a jar 1  19. Throwing a ball 1  20. Carrying a small suitcase with your affected limb.  2  Score total:  44      COGNITION: Overall cognitive status: Within functional limits for tasks assessed     SENSATION: WFL  POSTURE: Mild kyphosis, rounded shoulders, guarded R shoulder with shrugging position  UPPER EXTREMITY ROM:   Active ROM Right eval Left eval  Shoulder flexion 130 155  Shoulder extension    Shoulder abduction 140 155  Shoulder adduction    Shoulder internal rotation To belly To belly  Shoulder external rotation 30 60  Elbow flexion    Elbow extension    Wrist flexion    Wrist extension    Wrist ulnar deviation    Wrist radial deviation    Wrist pronation    Wrist supination    (Blank rows = not tested)  UPPER EXTREMITY MMT: not tested 2/2 to surgical healing precautions   JOINT MOBILITY TESTING:  R GHJ stiffness as expected in inf and post post op  PALPATION:  No TTP  along deltoid or biceps Hypertonicity of biceps belly                                                                                                                               TREATMENT DATE:   7/14 Pulley warm up 2 min flexion  PROM R  shoulder Supine rhythmic stabilization at 90deg Supine flexion 0#x10, 1# x10 Supine SA punch 2# 2x10 Supine ABC 2# Standing flexion 2x10 Standing abduction 2x10 Theraband row 2x10 GTB Standing press out 1# 2x10 IR behind back with strap 10seconds x5 Bent over T 2x10    7/9  Pulley warm up 2 min flexion and ABD  YTB rowing 3x10 Scaption 2x10 AROM Flexion AROM 2x10 Doorway pec arm low 30s 3x Bent over T 2x10 Short lever shoulder ABD in standing 2x10 AROM ER 2x10 standing bilat   7/2   Exercises - Standing Shoulder Abduction AAROM with Dowel  - 2 x daily - 7 x weekly - 2 sets - 10 reps - Shoulder Flexion Overhead with Dowel  - 2 x daily - 7 x weekly - 2 sets - 10 reps - Shoulder Scaption AAROM with Dowel  - 2 x daily - 7 x weekly - 2 sets - 10 reps - Bicep Stretch at Table  - 2 x daily - 7 x weekly - 1 sets - 3 reps - 30 hold - Standing Shoulder Flexion Wall Walk  - 2 x daily - 7 x weekly - 2 sets - 10 reps    PATIENT EDUCATION: Education details: surgical protocol, signs of infection, cryotherapy, edema management, joint protection, diagnosis, prognosis, anatomy, exercise progression, DOMS expectations, muscle firing,  envelope of function, HEP, POC Person educated: Patient Education method: Explanation, Demonstration, Tactile cues, Verbal cues, and Handouts Education comprehension: verbalized understanding, returned demonstration, verbal cues required, tactile cues required, and needs further education     HOME EXERCISE PROGRAM:    Access Code: [email or web address] URL: [email or web address] Date: 03/21/2024 Prepared by: Dale Call      ASSESSMENT:   CLINICAL IMPRESSION:   Pt with excellent available PROM with mild tightness into end range IR. Progressed strengthening with gentle resistance today without complaint. Mild deficit in [facility] with rhythmic stabilization. Advised him to continue with ice at home to manage DOMS. Will continue to progress as  tolerated. Will plan to update HEP next visit if no adverse response.      Surgical note has been scanned into patient's medical chart.   OBJECTIVE IMPAIRMENTS decreased activity tolerance, decreased ROM, decreased strength, increased muscle spasms, impaired UE functional use, improper body mechanics, postural dysfunction, and pain.    ACTIVITY LIMITATIONS cleaning, lifting, bending, carry, dressing, bathing, feeding, community activity, meal prep, laundry, and yard work.    PERSONAL FACTORS Time since onset of injury/illness/exacerbation is also affecting patient's functional outcome.     REHAB POTENTIAL: Good   CLINICAL DECISION MAKING: Stable/uncomplicated   EVALUATION COMPLEXITY: Low  GOALS:     SHORT TERM GOALS: Target date: 05/02/2024        Pt will become independent with HEP in order to demonstrate synthesis of PT education.   Goal status: INITIAL   2.  Pt will be able to demonstrate full PROM  in order to demonstrate functional improvement in UE for progression to next phase of protocol.     Goal status: INITIAL   3.  Pt will report at least 2 pt reduction on NPRS scale for pain in order to demonstrate functional improvement with household activity, self care, and ADL.    Goal status: INITIAL       LONG TERM GOALS: Target date: 06/19/2024       Pt  will become independent with final HEP in order to demonstrate synthesis of PT education.   Goal status: INITIAL   2.  Pt will be able to reach [facility] and carry/hold >15lbs in order to demonstrate functional improvement in R UE strength for return to household duties and ADL.SABRA    Goal status: INITIAL   3.  Pt will be able to demonstrate full [place] AROM of the R shoulder in order to demonstrate functional improvement in UE function for self-care and house hold duties.    Goal status: INITIAL   4.  Pt will test to within 80% HHD of L UE in order to demonstrate functional  UE strength improvement and limb symmetry.      Goal status: INITIAL      PLAN: PT FREQUENCY: 1-2x/week   PT DURATION: 12 weeks   PLANNED INTERVENTIONS: Therapeutic exercises, Therapeutic activity, Neuromuscular re-education, Patient/Family education, Joint mobilization, Dry Needling, Spinal mobilization, Cryotherapy, Moist heat, Taping, and Manual therapy, Re-evaluation   PLAN FOR NEXT SESSION:  A/AROM per [facility] repair protocol limits; consider rowing, review HEP     [place] BRAVO Thornton Dohrmann, PTA 04/02/2024, 3:08 PM

## 2024-04-04 ENCOUNTER — Ambulatory Visit (HOSPITAL_BASED_OUTPATIENT_CLINIC_OR_DEPARTMENT_OTHER): Admitting: Physical Therapy

## 2024-04-04 ENCOUNTER — Encounter (HOSPITAL_BASED_OUTPATIENT_CLINIC_OR_DEPARTMENT_OTHER): Payer: Self-pay | Admitting: Physical Therapy

## 2024-04-04 DIAGNOSIS — M6281 Muscle weakness (generalized): Secondary | ICD-10-CM

## 2024-04-04 DIAGNOSIS — M25611 Stiffness of right shoulder, not elsewhere classified: Secondary | ICD-10-CM | POA: Diagnosis not present

## 2024-04-04 DIAGNOSIS — M25511 Pain in right shoulder: Secondary | ICD-10-CM

## 2024-04-04 NOTE — Therapy (Signed)
 OUTPATIENT PHYSICAL THERAPY UPPER EXTREMITY TREATMENT   Patient Name: Donald Schwartz MRN: [id number] DOB:1966-08-30, 58 y.o., male Today's Date: 04/04/2024  END OF SESSION:  PT End of Session - 04/04/24 0844     Visit Number 4    Number of Visits 21    Date for PT Re-Evaluation 06/19/24    Authorization Type [facility]    PT Start Time 0802    PT Stop Time 0840    PT Time Calculation (min) 38 min    Activity Tolerance Patient tolerated treatment well    Behavior During Therapy [facility] for tasks assessed/performed            Past Medical History:  Diagnosis Date   Allergy    Arthritis    Cellulitis    Elevated hemoglobin A1c    Elevated LDL cholesterol level    Hx of colonic polyp 11/20/2014   Hyperlipidemia    Hypertension    Hypogonadism male    Neuromuscular disorder (HCC)    Trigeminar neuralgia   Sleep apnea    wears CPAP   Vitamin D  deficiency    Past Surgical History:  Procedure Laterality Date   c5-6 fusion     2012   COLONOSCOPY  01/30/2018   Dr.Gessner   HIP FRACTURE SURGERY Right 1986   avulsion/chip fracture ?stress fracture   POLYPECTOMY     Patient Active Problem List   Diagnosis Date Noted   Fatty liver 11/26/2020   Morbid obesity (HCC) - BMI 30+ with OSA 02/03/2016   Medication management 01/10/2015   Trigeminal neuralgia of right side of face 01/10/2015   Hx of colonic polyp + FHx CRCA 11/20/2014   CKD (chronic kidney disease) stage 2, GFR 60-89 ml/min 10/11/2014   OSA on CPAP 01/07/2014   Vitamin D  deficiency 01/06/2014   Hypertension    Hyperlipidemia    Other abnormal glucose (prediabetes)     PCP:   Chrystal Lamarr RAMAN, MD    REFERRING PROVIDER: Canda Franky Jurist, MD   REFERRING DIAG:  [phone] (ICD-10-CM) - Complete rotator cuff tear or rupture of right shoulder, not specified as traumatic      THERAPY DIAG:  Decreased right shoulder range of motion  Pain in joint of right shoulder  Muscle weakness  (generalized)  Rationale for Evaluation and Treatment: Rehabilitation  ONSET DATE: 02/03/2024 DOS  s/p Right shoulder  arthroscopic supraspinatus and subscapularis tendon repair  (per office visit note)   SUBJECTIVE:  SUBJECTIVE STATEMENT:  Pt reports he was mildly sore after last session that only lasted that day of therapy. No pain or issues. Bought small hand weights for new exercises from previous session.    Eval: Pt states its was a moderate sized tear. MD repaired rotator cuff, debrided the shoulder for the bursitis and does not report biceps tendon procedure. Dr. Canda is Careers adviser. Pt just recently came out of the sling this week after virtual visit with PA. He has been doing pendulums, table slides, and wall walks- 4x/day for 10 reps. Pt is sleeping in bed now and is back sleeper.  Incisions are closed and well healed.   Hand dominance: Right  PERTINENT HISTORY: HLD, HTN, recurrent cellulitis, CKD  PAIN:  Are you having pain? no: NPRS scale: 0/10 at rest; worst 6/10 Pain location: posterior shoulder, biceps Pain description: aching/ sore/[place] Aggravating factors: movement Relieving factors: rest, meds, icing  PRECAUTIONS: Shoulder  RED FLAGS: None   WEIGHT BEARING RESTRICTIONS: Yes WBAT now at 6 wks post op  FALLS:  Has patient fallen in last 6 months? 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  DIAGNOSTIC FINDINGS:  N/A in chart; MRI performed at  [facility]  PATIENT SURVEYS :  UEFS  Extreme difficulty/unable (0), Quite a bit of difficulty (1), Moderate difficulty (2), Little difficulty (3), No difficulty (4) Survey date:  eval  Any of your usual work, household or school activities 1  2. Your usual hobbies, recreational/sport activities 1   3. Lifting a bag of groceries to waist level 2   4. Lifting a bag of groceries above your head 3  5. Grooming your hair 2  6. Pushing up on your hands (I.e. from bathtub or chair) 1  7. Preparing food (I.e. peeling/cutting) 2  8. Driving  0  9. Vacuuming, sweeping, or raking 2  10. Dressing  3  11. Doing up buttons 4  12. Using tools/appliances 2  13. Opening doors 3  14. Cleaning  3  15. Tying or lacing shoes 4  16. Sleeping  3  17. Laundering clothes (I.e. washing, ironing, folding) 4  18. Opening a jar 1  19. Throwing a ball 1  20. Carrying a small suitcase with your affected limb.  2  Score total:  44      COGNITION: Overall cognitive status: Within functional limits for tasks assessed     SENSATION: WFL  POSTURE: Mild kyphosis, rounded shoulders, guarded R shoulder with shrugging position  UPPER EXTREMITY ROM:   Active ROM Right eval Left eval  Shoulder flexion 130 155  Shoulder extension    Shoulder abduction 140 155  Shoulder adduction    Shoulder internal rotation To belly To belly  Shoulder external rotation 30 60  Elbow flexion    Elbow extension    Wrist flexion    Wrist extension    Wrist ulnar deviation    Wrist radial deviation    Wrist pronation    Wrist supination    (Blank rows = not tested)  UPPER EXTREMITY MMT: not tested 2/2 to surgical healing precautions   JOINT MOBILITY TESTING:  R GHJ stiffness as expected in inf  and post post op  PALPATION:  No TTP along deltoid or biceps Hypertonicity of biceps belly                                                                                                                                TREATMENT DATE:   7/16  Pulley warm up flexion and ABD 2 min ea  S/L ER 10x no weight; 2x10 with 1lb ABC 1lb 2x  Supine flexion 1# 2x10 S/L ABD 2x10 1# Standing IR YTB 3x10 Wall push up 2x10 IR BHB stretch 10s 10x   7/14 Pulley warm up 2 min flexion  PROM R shoulder Supine rhythmic stabilization at 90deg Supine flexion 0#x10, 1# x10 Supine SA punch 2# 2x10 Supine ABC 2# Standing flexion 2x10 Standing abduction 2x10 Theraband row 2x10 GTB Standing press out 1# 2x10 IR behind back with strap 10seconds x5 Bent over T 2x10    7/9  Pulley warm up 2 min flexion and ABD  YTB rowing 3x10 Scaption 2x10 AROM Flexion AROM 2x10 Doorway pec arm low 30s 3x Bent over T 2x10 Short lever shoulder ABD in standing 2x10 AROM ER 2x10 standing bilat   7/2   Exercises - Standing Shoulder Abduction AAROM with Dowel  - 2 x daily - 7 x weekly - 2 sets - 10 reps - Shoulder Flexion Overhead with Dowel  - 2 x daily - 7 x weekly - 2 sets - 10 reps - Shoulder Scaption AAROM with Dowel  - 2 x daily - 7 x weekly - 2 sets - 10 reps - Bicep Stretch at Table  - 2 x daily - 7 x weekly - 1 sets - 3 reps - 30 hold - Standing Shoulder Flexion Wall Walk  - 2 x daily - 7 x weekly - 2 sets - 10 reps    PATIENT EDUCATION: Education details: surgical protocol, signs of infection, cryotherapy, edema management, joint protection, diagnosis, prognosis, anatomy, exercise progression, DOMS expectations, muscle firing,  envelope of function, HEP, POC Person educated: Patient Education method: Explanation, Demonstration, Tactile cues, Verbal cues, and Handouts Education comprehension: verbalized understanding, returned demonstration, verbal cues required, tactile cues required, and needs further education     HOME EXERCISE PROGRAM:    Access Code: [email or web address] URL: [email or web address] Date: 03/21/2024 Prepared by: Dale Call      ASSESSMENT:   CLINICAL IMPRESSION:   Pt able to  progress with resistance exercise of the R UE without pain. Pt fatigues quickly as expected with new exercise but no adverse response. Pt with good maintenance of ROM and able to progress HEP for home. Good tolerance to intro CKC exercise and advised to reduce frequency of HEP as needed if recovery and DOMS are significant after new resistance exercise. Plan to continue with progressive cuff strength and ROM in 90/90 and OH positions as tolerated. 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 Pt would benefit from continued skilled therapy in order to reach goals and maximize functional R UE strength and ROM for return to occupation, normalized ADL, and  PLOF.  .      Surgical note has been scanned into patient's medical chart.   OBJECTIVE IMPAIRMENTS decreased activity tolerance, decreased ROM, decreased strength, increased muscle spasms, impaired UE functional use, improper body mechanics, postural dysfunction, and pain.    ACTIVITY LIMITATIONS cleaning, lifting, bending, carry, dressing, bathing, feeding, community activity, meal prep, laundry, and yard work.    PERSONAL FACTORS Time since onset of injury/illness/exacerbation is also affecting patient's functional outcome.     REHAB POTENTIAL: Good   CLINICAL DECISION MAKING: Stable/uncomplicated   EVALUATION COMPLEXITY: Low     GOALS:     SHORT TERM GOALS: Target date: 05/02/2024        Pt will become independent with HEP in order to demonstrate synthesis of PT education.   Goal status: INITIAL   2.  Pt will be able to demonstrate full PROM  in order to demonstrate functional improvement in UE for progression to next phase of protocol.     Goal status: INITIAL   3.  Pt will report at least 2 pt reduction on NPRS scale for pain in order to demonstrate functional improvement with household activity, self care, and ADL.     Goal status: INITIAL       LONG TERM GOALS: Target date: 06/19/2024       Pt  will become independent with final HEP in order to demonstrate synthesis of PT education.   Goal status: INITIAL   2.  Pt will be able to reach [facility] and carry/hold >15lbs in order to demonstrate functional improvement in R UE strength for return to household duties and ADL.SABRA    Goal status: INITIAL   3.  Pt will be able to demonstrate full OH AROM of the R shoulder in order to demonstrate functional improvement in UE function for self-care and house hold duties.    Goal status: INITIAL   4.  Pt will test to within 80% HHD of L UE in order to demonstrate functional  UE strength improvement and limb symmetry.     Goal status: INITIAL      PLAN: PT FREQUENCY: 1-2x/week   PT DURATION: 12 weeks   PLANNED INTERVENTIONS: Therapeutic exercises, Therapeutic activity, Neuromuscular re-education, Patient/Family education, Joint mobilization, Dry Needling, Spinal mobilization, Cryotherapy, Moist heat, Taping, and Manual therapy, Re-evaluation   PLAN FOR NEXT SESSION:  A/AROM per [facility] repair protocol limits; consider rowing, review HEP     Dale Call, PT 04/04/2024, 8:58 AM
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
## 2024-04-05 ENCOUNTER — Ambulatory Visit: Payer: BC Managed Care – PPO | Admitting: Neurology

## 2024-04-05 ENCOUNTER — Encounter: Payer: Self-pay | Admitting: Neurology

## 2024-04-05 VITALS — BP 112/73 | HR 80 | Ht 73.0 in | Wt 241.8 lb

## 2024-04-05 DIAGNOSIS — G500 Trigeminal neuralgia: Secondary | ICD-10-CM | POA: Diagnosis not present

## 2024-04-05 DIAGNOSIS — G43009 Migraine without aura, not intractable, without status migrainosus: Secondary | ICD-10-CM | POA: Diagnosis not present

## 2024-04-05 MED ORDER — RIZATRIPTAN BENZOATE 10 MG PO TABS: 10.0000 mg | ORAL_TABLET | ORAL | 11 refills | Status: AC | PRN

## 2024-04-09 ENCOUNTER — Ambulatory Visit (HOSPITAL_BASED_OUTPATIENT_CLINIC_OR_DEPARTMENT_OTHER)

## 2024-04-09 ENCOUNTER — Encounter (HOSPITAL_BASED_OUTPATIENT_CLINIC_OR_DEPARTMENT_OTHER): Payer: Self-pay

## 2024-04-09 DIAGNOSIS — M25611 Stiffness of right shoulder, not elsewhere classified: Secondary | ICD-10-CM | POA: Diagnosis not present

## 2024-04-09 DIAGNOSIS — M25511 Pain in right shoulder: Secondary | ICD-10-CM

## 2024-04-09 DIAGNOSIS — M6281 Muscle weakness (generalized): Secondary | ICD-10-CM

## 2024-04-09 NOTE — Therapy (Signed)
 OUTPATIENT PHYSICAL THERAPY UPPER EXTREMITY TREATMENT   Patient Name: Donald Schwartz MRN: [id number] DOB:1965-11-01, 58 y.o., male Today's Date: 04/09/2024  END OF SESSION:  PT End of Session - 04/09/24 1603     Visit Number 5    Number of Visits 21    Date for PT Re-Evaluation 06/19/24    Authorization Type [place]    PT Start Time 1601    PT Stop Time 1649    PT Time Calculation (min) 48 min    Activity Tolerance Patient tolerated treatment well    Behavior During Therapy [facility] for tasks assessed/performed             Past Medical History:  Diagnosis Date   Allergy    Arthritis    Cellulitis    Elevated hemoglobin A1c    Elevated LDL cholesterol level    Hx of colonic polyp 11/20/2014   Hyperlipidemia    Hypertension    Hypogonadism male    Neuromuscular disorder (HCC)    Trigeminar neuralgia   Sleep apnea    wears CPAP   Vitamin D  deficiency    Past Surgical History:  Procedure Laterality Date   c5-6 fusion     2012   COLONOSCOPY  01/30/2018   Dr.Gessner   HIP FRACTURE SURGERY Right 1986   avulsion/chip fracture ?stress fracture   POLYPECTOMY     Patient Active Problem List   Diagnosis Date Noted   Fatty liver 11/26/2020   Morbid obesity (HCC) - BMI 30+ with OSA 02/03/2016   Medication management 01/10/2015   Trigeminal neuralgia of right side of face 01/10/2015   Hx of colonic polyp + FHx CRCA 11/20/2014   CKD (chronic kidney disease) stage 2, GFR 60-89 ml/min 10/11/2014   OSA on CPAP 01/07/2014   Vitamin D  deficiency 01/06/2014   Hypertension    Hyperlipidemia    Other abnormal glucose (prediabetes)     PCP:   Chrystal Lamarr RAMAN, MD    REFERRING PROVIDER: Canda Franky Jurist, MD   REFERRING DIAG:  [id number] (ICD-10-CM) - Complete rotator cuff tear or rupture of right shoulder, not specified as traumatic      THERAPY DIAG:  Decreased right shoulder range of motion  Pain in joint of right shoulder  Muscle weakness  (generalized)  Rationale for Evaluation and Treatment: Rehabilitation  ONSET DATE: 02/03/2024 DOS  s/p Right shoulder  arthroscopic supraspinatus and subscapularis tendon repair  (per office visit note)   SUBJECTIVE:  SUBJECTIVE STATEMENT:  Pt reports he has been using 3# weights at home with exercises without issue. No complaints at entry.    Eval: Pt states its was a moderate sized tear. MD repaired rotator cuff, debrided the shoulder for the bursitis and does not report biceps tendon procedure. Dr. Canda is Careers adviser. Pt just recently came out of the sling this week after virtual visit with PA. He has been doing pendulums, table slides, and wall walks- 4x/day for 10 reps. Pt is sleeping in bed now and is back sleeper.  Incisions are closed and well healed.   Hand dominance: Right  PERTINENT HISTORY: HLD, HTN, recurrent cellulitis, CKD  PAIN:  Are you having pain? no: NPRS scale: 0/10 at rest; worst 6/10 Pain location: posterior shoulder, biceps Pain description: aching/ sore/[place] Aggravating factors: movement Relieving factors: rest, meds, icing  PRECAUTIONS: Shoulder  RED FLAGS: None   WEIGHT BEARING RESTRICTIONS: Yes WBAT now at 6 wks post op  FALLS:  Has patient fallen in last 6 months? 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  DIAGNOSTIC FINDINGS:  N/A in chart; MRI performed at [facility]  PATIENT SURVEYS :  UEFS  Extreme difficulty/unable (0), Quite a bit  of difficulty (1), Moderate difficulty (2), Little difficulty (3), No difficulty (4) Survey date:  eval  Any of your usual work, household or school activities 1  2. Your usual hobbies, recreational/sport activities 1   3. Lifting a bag of groceries to waist level 2   4. Lifting a bag of groceries above your head 3  5. Grooming your hair 2  6. Pushing up on your hands (I.e. from bathtub or chair) 1  7. Preparing food (I.e. peeling/cutting) 2  8. Driving  0  9. Vacuuming, sweeping, or raking 2  10. Dressing  3  11. Doing up buttons 4  12. Using tools/appliances 2  13. Opening doors 3  14. Cleaning  3  15. Tying or lacing shoes 4  16. Sleeping  3  17. Laundering clothes (I.e. washing, ironing, folding) 4  18. Opening a jar 1  19. Throwing a ball 1  20. Carrying a small suitcase with your affected limb.  2  Score total:  44      COGNITION: Overall cognitive status: Within functional limits for tasks assessed     SENSATION: WFL  POSTURE: Mild kyphosis, rounded shoulders, guarded R shoulder with shrugging position  UPPER EXTREMITY ROM:   Active ROM Right eval Left eval  Shoulder flexion 130 155  Shoulder extension    Shoulder abduction 140 155  Shoulder adduction    Shoulder internal rotation To belly To belly  Shoulder external rotation 30 60  Elbow flexion    Elbow extension    Wrist flexion    Wrist extension    Wrist ulnar deviation    Wrist radial deviation    Wrist pronation    Wrist supination    (Blank rows = not tested)  UPPER EXTREMITY MMT: not tested 2/2 to surgical healing precautions   JOINT MOBILITY TESTING:  R GHJ stiffness as expected in inf and post post op  PALPATION:  No TTP along deltoid or  biceps Hypertonicity of biceps belly                                                                                                                               TREATMENT DATE:   7/21 Pulley warm up 2 min flexion and abduction PROM R  shoulder Supine flexion 2#x10, 3# 2x10 Sidelying abduction 3# 2x10 S/l ER 3# 2x10 Supine SA punch 2# 2x10 Supine ABC 2# x2 Standing flexion 2x10 Standing abduction 2x10 Theraband row 2x10 GTB Standing press out 2# 2x10 IR behind back with strap 10seconds x10 Wall push ups 2x10 Ball rolls at wall 4way 2.2# ball x20ea    7/16  Pulley warm up flexion and ABD 2 min ea  S/L ER 10x no weight; 2x10 with 1lb ABC 1lb 2x  Supine flexion 1# 2x10 S/L ABD 2x10 1# Standing IR YTB 3x10 Wall push up 2x10 IR BHB stretch 10s 10x   7/14 Pulley warm up 2 min flexion  PROM R shoulder Supine rhythmic stabilization at 90deg Supine flexion 0#x10, 1# x10 Supine SA punch 2# 2x10 Supine ABC 2# Standing flexion 2x10 Standing abduction 2x10 Theraband row 2x10 GTB Standing press out 1# 2x10 IR behind back with strap 10seconds x5 Bent over T 2x10    7/9  Pulley warm up 2 min flexion and ABD  YTB rowing 3x10 Scaption 2x10 AROM Flexion AROM 2x10 Doorway pec arm low 30s 3x Bent over T 2x10 Short lever shoulder ABD in standing 2x10 AROM ER 2x10 standing bilat   7/2   Exercises - Standing Shoulder Abduction AAROM with Dowel  - 2 x daily - 7 x weekly - 2 sets - 10 reps - Shoulder Flexion Overhead with Dowel  - 2 x daily - 7 x weekly - 2 sets - 10 reps - Shoulder Scaption AAROM with Dowel  - 2 x daily - 7 x weekly - 2 sets - 10 reps - Bicep Stretch at Table  - 2 x daily - 7 x weekly - 1 sets - 3 reps - 30 hold - Standing Shoulder Flexion Wall Walk  - 2 x daily - 7 x weekly - 2 sets - 10 reps    PATIENT EDUCATION: Education details: surgical protocol, signs of infection, cryotherapy, edema management, joint protection, diagnosis, prognosis, anatomy, exercise progression, DOMS expectations, muscle firing,  envelope of function, HEP, POC Person educated: Patient Education method: Explanation, Demonstration, Tactile cues, Verbal cues, and Handouts Education comprehension: verbalized  understanding, returned demonstration, verbal cues required, tactile cues required, and needs further education     HOME EXERCISE PROGRAM:    Access Code: [email or web address] URL: [email or web address] Date: 03/21/2024 Prepared by: Dale Call      ASSESSMENT:   CLINICAL IMPRESSION:   Able to progress with resistance today with good tolerance. He did fatigue with standing press outs with 2# as well as ball rolls at wall with 2.2# ball. Continues to be limited in IR, so continued with  PROM and IR behind back stretching to improve this. Pt to continue with HEP. Will progress as tolerated. Pt is hopeful to return to full duty work by end of August.   .      Surgical note has been scanned into patient's medical chart.   OBJECTIVE IMPAIRMENTS decreased activity tolerance, decreased ROM, decreased strength, increased muscle spasms, impaired UE functional use, improper body mechanics, postural dysfunction, and pain.    ACTIVITY LIMITATIONS cleaning, lifting, bending, carry, dressing, bathing, feeding, community activity, meal prep, laundry, and yard work.    PERSONAL FACTORS Time since onset of injury/illness/exacerbation is also affecting patient's functional outcome.     REHAB POTENTIAL: Good   CLINICAL DECISION MAKING: Stable/uncomplicated   EVALUATION COMPLEXITY: Low     GOALS:     SHORT TERM GOALS: Target date: 05/02/2024        Pt will become independent with HEP in order to demonstrate synthesis of PT education.   Goal status: INITIAL   2.  Pt will be able to demonstrate full PROM  in order to demonstrate functional improvement in UE for progression to next phase of protocol.     Goal status: INITIAL   3.  Pt will report at least 2 pt reduction on NPRS scale for pain in order to demonstrate functional improvement with household activity, self care, and ADL.    Goal status: INITIAL       LONG TERM GOALS: Target date: 06/19/2024       Pt  will become  independent with final HEP in order to demonstrate synthesis of PT education.   Goal status: INITIAL   2.  Pt will be able to reach [facility] and carry/hold >15lbs in order to demonstrate functional improvement in R UE strength for return to household duties and ADL.SABRA    Goal status: INITIAL   3.  Pt will be able to demonstrate full [place] AROM of the R shoulder in order to demonstrate functional improvement in UE function for self-care and house hold duties.    Goal status: INITIAL   4.  Pt will test to within 80% HHD of L UE in order to demonstrate functional  UE strength improvement and limb symmetry.     Goal status: INITIAL      PLAN: PT FREQUENCY: 1-2x/week   PT DURATION: 12 weeks   PLANNED INTERVENTIONS: Therapeutic exercises, Therapeutic activity, Neuromuscular re-education, Patient/Family education, Joint mobilization, Dry Needling, Spinal mobilization, Cryotherapy, Moist heat, Taping, and Manual therapy, Re-evaluation   PLAN FOR NEXT SESSION:  A/AROM per [facility] repair protocol limits; consider rowing, review HEP     Asberry BRAVO Clotilde Loth, PTA 04/09/2024, 5:01 PM

## 2024-04-11 ENCOUNTER — Encounter (HOSPITAL_BASED_OUTPATIENT_CLINIC_OR_DEPARTMENT_OTHER): Payer: Self-pay

## 2024-04-11 ENCOUNTER — Ambulatory Visit (HOSPITAL_BASED_OUTPATIENT_CLINIC_OR_DEPARTMENT_OTHER)

## 2024-04-11 DIAGNOSIS — M25611 Stiffness of right shoulder, not elsewhere classified: Secondary | ICD-10-CM | POA: Diagnosis not present

## 2024-04-11 DIAGNOSIS — M6281 Muscle weakness (generalized): Secondary | ICD-10-CM

## 2024-04-11 DIAGNOSIS — M25511 Pain in right shoulder: Secondary | ICD-10-CM

## 2024-04-11 NOTE — Therapy (Signed)
 OUTPATIENT PHYSICAL THERAPY UPPER EXTREMITY TREATMENT   Patient Name: Donald Schwartz MRN: [id number] DOB:10/28/1965, 58 y.o., male Today's Date: 04/11/2024  END OF SESSION:  PT End of Session - 04/11/24 0804     Visit Number 6    Number of Visits 21    Date for PT Re-Evaluation 06/19/24    Authorization Type [place]    PT Start Time 0801    PT Stop Time 0848    PT Time Calculation (min) 47 min    Activity Tolerance Patient tolerated treatment well    Behavior During Therapy [facility] for tasks assessed/performed              Past Medical History:  Diagnosis Date   Allergy    Arthritis    Cellulitis    Elevated hemoglobin A1c    Elevated LDL cholesterol level    Hx of colonic polyp 11/20/2014   Hyperlipidemia    Hypertension    Hypogonadism male    Neuromuscular disorder (HCC)    Trigeminar neuralgia   Sleep apnea    wears CPAP   Vitamin D  deficiency    Past Surgical History:  Procedure Laterality Date   c5-6 fusion     2012   COLONOSCOPY  01/30/2018   Dr.Gessner   HIP FRACTURE SURGERY Right 1986   avulsion/chip fracture ?stress fracture   POLYPECTOMY     Patient Active Problem List   Diagnosis Date Noted   Fatty liver 11/26/2020   Morbid obesity (HCC) - BMI 30+ with OSA 02/03/2016   Medication management 01/10/2015   Trigeminal neuralgia of right side of face 01/10/2015   Hx of colonic polyp + FHx CRCA 11/20/2014   CKD (chronic kidney disease) stage 2, GFR 60-89 ml/min 10/11/2014   OSA on CPAP 01/07/2014   Vitamin D  deficiency 01/06/2014   Hypertension    Hyperlipidemia    Other abnormal glucose (prediabetes)     PCP:   Chrystal Lamarr RAMAN, MD    REFERRING PROVIDER: Canda Franky Jurist, MD   REFERRING DIAG:  [phone] (ICD-10-CM) - Complete rotator cuff tear or rupture of right shoulder, not specified as traumatic      THERAPY DIAG:  Decreased right shoulder range of motion  Muscle weakness (generalized)  Pain in joint of right  shoulder  Rationale for Evaluation and Treatment: Rehabilitation  ONSET DATE: 02/03/2024 DOS  s/p Right shoulder  arthroscopic supraspinatus and subscapularis tendon repair  (per office visit note)   SUBJECTIVE:  SUBJECTIVE STATEMENT:  Pt reports he has soreness following exercise, but this doesn't last too long. Mild soreness at entry.     Eval: Pt states its was a moderate sized tear. MD repaired rotator cuff, debrided the shoulder for the bursitis and does not report biceps tendon procedure. Dr. Canda is Careers adviser. Pt just recently came out of the sling this week after virtual visit with PA. He has been doing pendulums, table slides, and wall walks- 4x/day for 10 reps. Pt is sleeping in bed now and is back sleeper.  Incisions are closed and well healed.   Hand dominance: Right  PERTINENT HISTORY: HLD, HTN, recurrent cellulitis, CKD  PAIN:  Are you having pain? no: NPRS scale: 0/10 at rest; worst 6/10 Pain location: posterior shoulder, biceps Pain description: aching/ sore/[place] Aggravating factors: movement Relieving factors: rest, meds, icing  PRECAUTIONS: Shoulder  RED FLAGS: None   WEIGHT BEARING RESTRICTIONS: Yes WBAT now at 6 wks post op  FALLS:  Has patient fallen in last 6 months? 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  DIAGNOSTIC FINDINGS:  N/A in chart; MRI performed at [facility]  PATIENT SURVEYS :  UEFS  Extreme difficulty/unable (0), Quite a bit of  difficulty (1), Moderate difficulty (2), Little difficulty (3), No difficulty (4) Survey date:  eval  Any of your usual work, household or school activities 1  2. Your usual hobbies, recreational/sport activities 1   3. Lifting a bag of groceries to waist level 2   4. Lifting a bag of groceries above your head 3  5. Grooming your hair 2  6. Pushing up on your hands (I.e. from bathtub or chair) 1  7. Preparing food (I.e. peeling/cutting) 2  8. Driving  0  9. Vacuuming, sweeping, or raking 2  10. Dressing  3  11. Doing up buttons 4  12. Using tools/appliances 2  13. Opening doors 3  14. Cleaning  3  15. Tying or lacing shoes 4  16. Sleeping  3  17. Laundering clothes (I.e. washing, ironing, folding) 4  18. Opening a jar 1  19. Throwing a ball 1  20. Carrying a small suitcase with your affected limb.  2  Score total:  44      COGNITION: Overall cognitive status: Within functional limits for tasks assessed     SENSATION: WFL  POSTURE: Mild kyphosis, rounded shoulders, guarded R shoulder with shrugging position  UPPER EXTREMITY ROM:   Active ROM Right eval Left eval  Shoulder flexion 130 155  Shoulder extension    Shoulder abduction 140 155  Shoulder adduction    Shoulder internal rotation To belly To belly  Shoulder external rotation 30 60  Elbow flexion    Elbow extension    Wrist flexion    Wrist extension    Wrist ulnar deviation    Wrist radial deviation    Wrist pronation    Wrist supination    (Blank rows = not tested)  UPPER EXTREMITY MMT: not tested 2/2 to surgical healing precautions   JOINT MOBILITY TESTING:  R GHJ stiffness as expected in inf and post post op  PALPATION:  No TTP along deltoid or  biceps Hypertonicity of biceps belly                                                                                                                               TREATMENT DATE:   7/23 UBE ea fwd/back L3 PROM R shoulder Supine flexion  2#x10, 3# 2x10 Sidelying abduction 3# 2x10 S/l ER 3# 2x10 Supine SA punch 3# 2x15 Supine ABC 2# x2 Standing flexion 2x10 2#  Standing press out 2# 2x10 Wall push ups 2x10 OH press 5# 2x10 Bicep curls 8# 3x10 Triceps press down  45lbs x10 55lbs 2x10 Row machine 35lbs 3x10 Punches 3# 2x10   7/21 Pulley warm up 2 min flexion and abduction PROM R shoulder Supine flexion 2#x10, 3# 2x10 Sidelying abduction 3# 2x10 S/l ER 3# 2x10 Supine SA punch 2# 2x10 Supine ABC 2# x2 Standing flexion 2x10 Standing abduction 2x10 Theraband row 2x10 GTB Standing press out 2# 2x10 IR behind back with strap 10seconds x10 Wall push ups 2x10 Ball rolls at wall 4way 2.2# ball x20ea       7/16  Pulley warm up flexion and ABD 2 min ea  S/L ER 10x no weight; 2x10 with 1lb ABC 1lb 2x  Supine flexion 1# 2x10 S/L ABD 2x10 1# Standing IR YTB 3x10 Wall push up 2x10 IR BHB stretch 10s 10x   7/14 Pulley warm up 2 min flexion  PROM R shoulder Supine rhythmic stabilization at 90deg Supine flexion 0#x10, 1# x10 Supine SA punch 2# 2x10 Supine ABC 2# Standing flexion 2x10 Standing abduction 2x10 Theraband row 2x10 GTB Standing press out 1# 2x10 IR behind back with strap 10seconds x5 Bent over T 2x10    7/9  Pulley warm up 2 min flexion and ABD  YTB rowing 3x10 Scaption 2x10 AROM Flexion AROM 2x10 Doorway pec arm low 30s 3x Bent over T 2x10 Short lever shoulder ABD in standing 2x10 AROM ER 2x10 standing bilat   7/2   Exercises - Standing Shoulder Abduction AAROM with Dowel  - 2 x daily - 7 x weekly - 2 sets - 10 reps - Shoulder Flexion Overhead with Dowel  - 2 x daily - 7 x weekly - 2 sets - 10 reps - Shoulder Scaption AAROM with Dowel  - 2 x daily - 7 x weekly - 2 sets - 10 reps - Bicep Stretch at Table  - 2 x daily - 7 x weekly - 1 sets - 3 reps - 30 hold - Standing Shoulder Flexion Wall Walk  - 2 x daily - 7 x weekly - 2 sets - 10 reps    PATIENT EDUCATION: Education  details: surgical protocol, signs of infection, cryotherapy, edema management, joint protection, diagnosis, prognosis, anatomy, exercise progression, DOMS expectations, muscle firing,  envelope of function, HEP, POC Person educated: Patient Education method: Explanation, Demonstration, Tactile cues, Verbal cues, and Handouts Education comprehension: verbalized understanding, returned demonstration, verbal cues required, tactile cues required, and  needs further education     HOME EXERCISE PROGRAM:    Access Code: [email or web address] URL: [email or web address] Date: 03/21/2024 Prepared by: Dale Call      ASSESSMENT:   CLINICAL IMPRESSION:   Continued to progress with UE stability, ROM, and strengthening with good tolerance. Initiated UBE today which pt did well with. Challenged by outward punches using 3# weight. Able to initiate machine strengthening for triceps and rhomboids today. Light cuing required with exercises for proper performance. No c/o pain throughout session, only mild fatigue.      Surgical note has been scanned into patient's medical chart.   OBJECTIVE IMPAIRMENTS decreased activity tolerance, decreased ROM, decreased strength, increased muscle spasms, impaired UE functional use, improper body mechanics, postural dysfunction, and pain.    ACTIVITY LIMITATIONS cleaning, lifting, bending, carry, dressing, bathing, feeding, community activity, meal prep, laundry, and yard work.    PERSONAL FACTORS Time since onset of injury/illness/exacerbation is also affecting patient's functional outcome.     REHAB POTENTIAL: Good   CLINICAL DECISION MAKING: Stable/uncomplicated   EVALUATION COMPLEXITY: Low     GOALS:     SHORT TERM GOALS: Target date: 05/02/2024        Pt will become independent with HEP in order to demonstrate synthesis of PT education.   Goal status: INITIAL   2.  Pt will be able to demonstrate full PROM  in order to demonstrate functional  improvement in UE for progression to next phase of protocol.     Goal status: INITIAL   3.  Pt will report at least 2 pt reduction on NPRS scale for pain in order to demonstrate functional improvement with household activity, self care, and ADL.    Goal status: INITIAL       LONG TERM GOALS: Target date: 06/19/2024       Pt  will become independent with final HEP in order to demonstrate synthesis of PT education.   Goal status: INITIAL   2.  Pt will be able to reach [facility] and carry/hold >15lbs in order to demonstrate functional improvement in R UE strength for return to household duties and ADL.SABRA    Goal status: INITIAL   3.  Pt will be able to demonstrate full [place] AROM of the R shoulder in order to demonstrate functional improvement in UE function for self-care and house hold duties.    Goal status: INITIAL   4.  Pt will test to within 80% HHD of L UE in order to demonstrate functional  UE strength improvement and limb symmetry.     Goal status: INITIAL      PLAN: PT FREQUENCY: 1-2x/week   PT DURATION: 12 weeks   PLANNED INTERVENTIONS: Therapeutic exercises, Therapeutic activity, Neuromuscular re-education, Patient/Family education, Joint mobilization, Dry Needling, Spinal mobilization, Cryotherapy, Moist heat, Taping, and Manual therapy, Re-evaluation   PLAN FOR NEXT SESSION:  A/AROM per [facility] repair protocol limits; consider rowing, review HEP     Asberry BRAVO Zymiere Trostle, PTA 04/11/2024, 8:57 AM

## 2024-04-16 ENCOUNTER — Ambulatory Visit (HOSPITAL_BASED_OUTPATIENT_CLINIC_OR_DEPARTMENT_OTHER): Admitting: Physical Therapy

## 2024-04-16 ENCOUNTER — Encounter (HOSPITAL_BASED_OUTPATIENT_CLINIC_OR_DEPARTMENT_OTHER): Payer: Self-pay | Admitting: Physical Therapy

## 2024-04-16 DIAGNOSIS — M6281 Muscle weakness (generalized): Secondary | ICD-10-CM

## 2024-04-16 DIAGNOSIS — M25511 Pain in right shoulder: Secondary | ICD-10-CM

## 2024-04-16 DIAGNOSIS — M25611 Stiffness of right shoulder, not elsewhere classified: Secondary | ICD-10-CM

## 2024-04-16 NOTE — Therapy (Signed)
 OUTPATIENT PHYSICAL THERAPY UPPER EXTREMITY TREATMENT   Patient Name: Donald Schwartz MRN: [id number] DOB:1966/03/30, 58 y.o., male Today's Date: 04/16/2024  END OF SESSION:  PT End of Session - 04/16/24 0905     Visit Number 7    Number of Visits 21    Date for PT Re-Evaluation 06/19/24    Authorization Type [place]    PT Start Time 0847    PT Stop Time 0927    PT Time Calculation (min) 40 min    Activity Tolerance Patient tolerated treatment well    Behavior During Therapy [facility] for tasks assessed/performed               Past Medical History:  Diagnosis Date   Allergy    Arthritis    Cellulitis    Elevated hemoglobin A1c    Elevated LDL cholesterol level    Hx of colonic polyp 11/20/2014   Hyperlipidemia    Hypertension    Hypogonadism male    Neuromuscular disorder (HCC)    Trigeminar neuralgia   Sleep apnea    wears CPAP   Vitamin D  deficiency    Past Surgical History:  Procedure Laterality Date   c5-6 fusion     2012   COLONOSCOPY  01/30/2018   Dr.Gessner   HIP FRACTURE SURGERY Right 1986   avulsion/chip fracture ?stress fracture   POLYPECTOMY     Patient Active Problem List   Diagnosis Date Noted   Fatty liver 11/26/2020   Morbid obesity (HCC) - BMI 30+ with OSA 02/03/2016   Medication management 01/10/2015   Trigeminal neuralgia of right side of face 01/10/2015   Hx of colonic polyp + FHx CRCA 11/20/2014   CKD (chronic kidney disease) stage 2, GFR 60-89 ml/min 10/11/2014   OSA on CPAP 01/07/2014   Vitamin D  deficiency 01/06/2014   Hypertension    Hyperlipidemia    Other abnormal glucose (prediabetes)     PCP:   Chrystal Lamarr RAMAN, MD    REFERRING PROVIDER: Canda Franky Jurist, MD   REFERRING DIAG:  [phone] (ICD-10-CM) - Complete rotator cuff tear or rupture of right shoulder, not specified as traumatic      THERAPY DIAG:  Decreased right shoulder range of motion  Muscle weakness (generalized)  Pain in joint of right  shoulder  Rationale for Evaluation and Treatment: Rehabilitation  ONSET DATE: 02/03/2024 DOS  s/p Right shoulder  arthroscopic supraspinatus and subscapularis tendon repair  (per office visit note)   SUBJECTIVE:  SUBJECTIVE STATEMENT:  Pt continues to notice improvement with the R shoulder. He has spoken with liaison for work and intends to return by Aug 2nd.     Eval: Pt states its was a moderate sized tear. MD repaired rotator cuff, debrided the shoulder for the bursitis and does not report biceps tendon procedure. Dr. Canda is Careers adviser. Pt just recently came out of the sling this week after virtual visit with PA. He has been doing pendulums, table slides, and wall walks- 4x/day for 10 reps. Pt is sleeping in bed now and is back sleeper.  Incisions are closed and well healed.   Hand dominance: Right  PERTINENT HISTORY: HLD, HTN, recurrent cellulitis, CKD  PAIN:  Are you having pain? no: NPRS scale: 0/10 at rest; worst 6/10 Pain location: posterior shoulder, biceps Pain description: aching/ sore/[place] Aggravating factors: movement Relieving factors: rest, meds, icing  PRECAUTIONS: Shoulder  RED FLAGS: None   WEIGHT BEARING RESTRICTIONS: Yes WBAT now at 6 wks post op  FALLS:  Has patient fallen in last 6 months? 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  DIAGNOSTIC FINDINGS:  N/A in chart; MRI performed at [facility]  PATIENT SURVEYS :  UEFS  Extreme  difficulty/unable (0), Quite a bit of difficulty (1), Moderate difficulty (2), Little difficulty (3), No difficulty (4) Survey date:  eval  Any of your usual work, household or school activities 1  2. Your usual hobbies, recreational/sport activities 1   3. Lifting a bag of groceries to waist level 2   4. Lifting a bag of groceries above your head 3  5. Grooming your hair 2  6. Pushing up on your hands (I.e. from bathtub or chair) 1  7. Preparing food (I.e. peeling/cutting) 2  8. Driving  0  9. Vacuuming, sweeping, or raking 2  10. Dressing  3  11. Doing up buttons 4  12. Using tools/appliances 2  13. Opening doors 3  14. Cleaning  3  15. Tying or lacing shoes 4  16. Sleeping  3  17. Laundering clothes (I.e. washing, ironing, folding) 4  18. Opening a jar 1  19. Throwing a ball 1  20. Carrying a small suitcase with your affected limb.  2  Score total:  44      COGNITION: Overall cognitive status: Within functional limits for tasks assessed     SENSATION: WFL  POSTURE: Mild kyphosis, rounded shoulders, guarded R shoulder with shrugging position  UPPER EXTREMITY ROM:   Active ROM Right eval Left eval  Shoulder flexion 130 155  Shoulder extension    Shoulder abduction 140 155  Shoulder adduction    Shoulder internal rotation To belly To belly  Shoulder external rotation 30 60  Elbow flexion    Elbow extension    Wrist flexion    Wrist extension    Wrist ulnar deviation    Wrist radial deviation    Wrist pronation    Wrist supination    (Blank rows = not tested)  UPPER EXTREMITY MMT: not tested 2/2 to surgical healing precautions   JOINT MOBILITY TESTING:  R GHJ stiffness as expected in inf and post post op  PALPATION:  No TTP along deltoid or biceps Hypertonicity of biceps belly                                                                                                                               TREATMENT DATE:   7/28 Pulley warm up  Seated  unilateral cable row 3x6-8 Cable bicep curl 3x8 10lbs (with bar) Lateral raise 3lbs 3x8 Table push up plus 3x8 RTB star  3x5 YTB ER/ER 90/90 2x10   UBE ea fwd/back L3 PROM R shoulder Supine flexion 2#x10, 3# 2x10 Sidelying abduction 3# 2x10 S/l ER 3# 2x10 Supine SA punch 3# 2x15 Supine ABC 2# x2 Standing flexion 2x10 2#  Standing press out 2# 2x10 Wall push ups 2x10 OH press 5# 2x10 Bicep curls 8# 3x10 Triceps press down  45lbs x10 55lbs 2x10 Row machine 35lbs 3x10 Punches 3# 2x10  7/23 UBE ea fwd/back L3 PROM R shoulder Supine flexion 2#x10, 3# 2x10 Sidelying abduction 3# 2x10 S/l ER 3# 2x10 Supine SA punch 3# 2x15 Supine ABC 2# x2 Standing flexion 2x10 2#  Standing press out 2# 2x10 Wall push ups 2x10 OH press 5# 2x10 Bicep curls 8# 3x10 Triceps press down  45lbs x10 55lbs 2x10 Row machine 35lbs 3x10 Punches 3# 2x10   7/21 Pulley warm up 2 min flexion and abduction PROM R shoulder Supine flexion 2#x10, 3# 2x10 Sidelying abduction 3# 2x10 S/l ER 3# 2x10 Supine SA punch 2# 2x10 Supine ABC 2# x2 Standing flexion 2x10 Standing abduction 2x10 Theraband row 2x10 GTB Standing press out 2# 2x10 IR behind back with strap 10seconds x10 Wall push ups 2x10 Ball rolls at wall 4way 2.2# ball x20ea       7/16  Pulley warm up flexion and ABD 2 min ea  S/L ER 10x no weight; 2x10 with 1lb ABC 1lb 2x  Supine flexion 1# 2x10 S/L ABD 2x10 1# Standing IR YTB 3x10 Wall push up 2x10 IR BHB stretch 10s 10x   7/14 Pulley warm up 2 min flexion  PROM R shoulder Supine rhythmic stabilization at 90deg Supine flexion 0#x10, 1# x10 Supine SA punch 2# 2x10 Supine ABC 2# Standing flexion 2x10 Standing abduction 2x10 Theraband row 2x10 GTB Standing press out 1# 2x10 IR behind back with strap 10seconds x5 Bent over T 2x10    7/9  Pulley warm up 2 min flexion and ABD  YTB rowing 3x10 Scaption 2x10 AROM Flexion AROM 2x10 Doorway pec arm low 30s  3x Bent over T 2x10 Short lever shoulder ABD in standing 2x10 AROM ER 2x10 standing bilat   7/2   Exercises - Standing Shoulder Abduction AAROM with Dowel  - 2 x daily - 7 x weekly - 2 sets - 10 reps - Shoulder Flexion Overhead with Dowel  - 2 x daily - 7 x weekly - 2 sets - 10 reps - Shoulder Scaption AAROM with Dowel  - 2 x daily - 7 x  weekly - 2 sets - 10 reps - Bicep Stretch at Table  - 2 x daily - 7 x weekly - 1 sets - 3 reps - 30 hold - Standing Shoulder Flexion Wall Walk  - 2 x daily - 7 x weekly - 2 sets - 10 reps    PATIENT EDUCATION: Education details: surgical protocol, signs of infection, cryotherapy, edema management, joint protection, diagnosis, prognosis, anatomy, exercise progression, DOMS expectations, muscle firing,  envelope of function, HEP, POC Person educated: Patient Education method: Explanation, Demonstration, Tactile cues, Verbal cues, and Handouts Education comprehension: verbalized understanding, returned demonstration, verbal cues required, tactile cues required, and needs further education     HOME EXERCISE PROGRAM:    Access Code: [email or web address] URL: [email or web address] Date: 03/21/2024 Prepared by: Dale Call      ASSESSMENT:   CLINICAL IMPRESSION:   10 wks post op. Pt able to progress intensity of strengthening with reduced frequency per week as pt is continuing with progressive strength and stability of the shoulder. Pt able to continue with light resistance beyond shoulder level without pain or discomfort. Pt does note fatigue. Pt advised to be aware of DOMS and to give the shoulder time for recovery between exercise sessions. HEP fully updated at today's visit. Pt would benefit from continued skilled therapy in order to reach goals and maximize functional R UE strength and ROM for return to PLOF, occupation, and normalized ADL.       Surgical note has been scanned into patient's medical chart.   OBJECTIVE IMPAIRMENTS decreased  activity tolerance, decreased ROM, decreased strength, increased muscle spasms, impaired UE functional use, improper body mechanics, postural dysfunction, and pain.    ACTIVITY LIMITATIONS cleaning, lifting, bending, carry, dressing, bathing, feeding, community activity, meal prep, laundry, and yard work.    PERSONAL FACTORS Time since onset of injury/illness/exacerbation is also affecting patient's functional outcome.     REHAB POTENTIAL: Good   CLINICAL DECISION MAKING: Stable/uncomplicated   EVALUATION COMPLEXITY: Low     GOALS:     SHORT TERM GOALS: Target date: 05/02/2024        Pt will become independent with HEP in order to demonstrate synthesis of PT education.   Goal status: INITIAL   2.  Pt will be able to demonstrate full PROM  in order to demonstrate functional improvement in UE for progression to next phase of protocol.     Goal status: INITIAL   3.  Pt will report at least 2 pt reduction on NPRS scale for pain in order to demonstrate functional improvement with household activity, self care, and ADL.    Goal status: INITIAL       LONG TERM GOALS: Target date: 06/19/2024       Pt  will become independent with final HEP in order to demonstrate synthesis of PT education.   Goal status: INITIAL   2.  Pt will be able to reach [facility] and carry/hold >15lbs in order to demonstrate functional improvement in R UE strength for return to household duties and ADL.SABRA    Goal status: INITIAL   3.  Pt will be able to demonstrate full OH AROM of the R shoulder in order to demonstrate functional improvement in UE function for self-care and house hold duties.    Goal status: INITIAL   4.  Pt will test to within 80% HHD of L UE in order to demonstrate functional  UE strength improvement and limb symmetry.     Goal status: INITIAL  PLAN: PT FREQUENCY: 1-2x/week   PT DURATION: 12 weeks   PLANNED INTERVENTIONS: Therapeutic exercises, Therapeutic activity, Neuromuscular  re-education, Patient/Family education, Joint mobilization, Dry Needling, Spinal mobilization, Cryotherapy, Moist heat, Taping, and Manual therapy, Re-evaluation   PLAN FOR NEXT SESSION:  A/AROM per [facility] repair protocol limits; consider rowing, review HEP     Dale Call, PT 04/16/2024, 9:30 AM

## 2024-04-17 ENCOUNTER — Ambulatory Visit: Payer: 59 | Admitting: Nurse Practitioner

## 2024-04-19 ENCOUNTER — Encounter (HOSPITAL_BASED_OUTPATIENT_CLINIC_OR_DEPARTMENT_OTHER)

## 2024-04-24 ENCOUNTER — Encounter (HOSPITAL_BASED_OUTPATIENT_CLINIC_OR_DEPARTMENT_OTHER): Payer: Self-pay | Admitting: Physical Therapy

## 2024-04-24 ENCOUNTER — Ambulatory Visit (HOSPITAL_BASED_OUTPATIENT_CLINIC_OR_DEPARTMENT_OTHER): Attending: Orthopedic Surgery | Admitting: Physical Therapy

## 2024-04-24 DIAGNOSIS — M25611 Stiffness of right shoulder, not elsewhere classified: Secondary | ICD-10-CM | POA: Insufficient documentation

## 2024-04-24 DIAGNOSIS — M6281 Muscle weakness (generalized): Secondary | ICD-10-CM | POA: Insufficient documentation

## 2024-04-24 DIAGNOSIS — M25511 Pain in right shoulder: Secondary | ICD-10-CM | POA: Diagnosis present

## 2024-04-24 NOTE — Therapy (Signed)
 OUTPATIENT PHYSICAL THERAPY UPPER EXTREMITY TREATMENT   Patient Name: Donald Schwartz MRN: [id number] DOB:[birth year], 58 y.o., male Today's Date: 04/24/2024  END OF SESSION:  PT End of Session - 04/24/24 9077     Visit Number 8    Number of Visits 21    Date for PT Re-Evaluation 06/19/24    Authorization Type [place]    PT Start Time 0847    PT Stop Time 0926    PT Time Calculation (min) 39 min    Activity Tolerance Patient tolerated treatment well    Behavior During Therapy [facility] for tasks assessed/performed                Past Medical History:  Diagnosis Date   Allergy    Arthritis    Cellulitis    Elevated hemoglobin A1c    Elevated LDL cholesterol level    Hx of colonic polyp 11/20/2014   Hyperlipidemia    Hypertension    Hypogonadism male    Neuromuscular disorder (HCC)    Trigeminar neuralgia   Sleep apnea    wears CPAP   Vitamin D  deficiency    Past Surgical History:  Procedure Laterality Date   c5-6 fusion     2012   COLONOSCOPY  01/30/2018   Dr.Gessner   HIP FRACTURE SURGERY Right 1986   avulsion/chip fracture ?stress fracture   POLYPECTOMY     Patient Active Problem List   Diagnosis Date Noted   Fatty liver 11/26/2020   Morbid obesity (HCC) - BMI 30+ with OSA 02/03/2016   Medication management 01/10/2015   Trigeminal neuralgia of right side of face 01/10/2015   Hx of colonic polyp + FHx CRCA 11/20/2014   CKD (chronic kidney disease) stage 2, GFR 60-89 ml/min 10/11/2014   OSA on CPAP 01/07/2014   Vitamin D  deficiency 01/06/2014   Hypertension    Hyperlipidemia    Other abnormal glucose (prediabetes)     PCP:   Chrystal Lamarr RAMAN, MD    REFERRING PROVIDER: Canda Franky Jurist, MD   REFERRING DIAG:  [phone] (ICD-10-CM) - Complete rotator cuff tear or rupture of right shoulder, not specified as traumatic      THERAPY DIAG:  Decreased right shoulder range of motion  Muscle weakness (generalized)  Pain in joint of right  shoulder  Rationale for Evaluation and Treatment: Rehabilitation  ONSET DATE: 02/03/2024 DOS  s/p Right shoulder  arthroscopic supraspinatus and subscapularis tendon repair  (per office visit note)   SUBJECTIVE:  SUBJECTIVE STATEMENT:  Pt continues to notice improvement with the R shoulder. He has spoken with liaison for work and intends to return by Aug 2nd.     Eval: Pt states its was a moderate sized tear. MD repaired rotator cuff, debrided the shoulder for the bursitis and does not report biceps tendon procedure. Dr. Canda is Careers adviser. Pt just recently came out of the sling this week after virtual visit with PA. He has been doing pendulums, table slides, and wall walks- 4x/day for 10 reps. Pt is sleeping in bed now and is back sleeper.  Incisions are closed and well healed.   Hand dominance: Right  PERTINENT HISTORY: HLD, HTN, recurrent cellulitis, CKD  PAIN:  Are you having pain? no: NPRS scale: 0/10 at rest; worst 6/10 Pain location: posterior shoulder, biceps Pain description: aching/ sore/[place] Aggravating factors: movement Relieving factors: rest, meds, icing  PRECAUTIONS: Shoulder  RED FLAGS: None   WEIGHT BEARING RESTRICTIONS: Yes WBAT now at 6 wks post op  FALLS:  Has patient fallen in last 6 months? 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  DIAGNOSTIC FINDINGS:  N/A in chart; MRI performed at [facility]  PATIENT SURVEYS :  UEFS  Extreme  difficulty/unable (0), Quite a bit of difficulty (1), Moderate difficulty (2), Little difficulty (3), No difficulty (4) Survey date:  eval  Any of your usual work, household or school activities 1  2. Your usual hobbies, recreational/sport activities 1   3. Lifting a bag of groceries to waist level 2   4. Lifting a bag of groceries above your head 3  5. Grooming your hair 2  6. Pushing up on your hands (I.e. from bathtub or chair) 1  7. Preparing food (I.e. peeling/cutting) 2  8. Driving  0  9. Vacuuming, sweeping, or raking 2  10. Dressing  3  11. Doing up buttons 4  12. Using tools/appliances 2  13. Opening doors 3  14. Cleaning  3  15. Tying or lacing shoes 4  16. Sleeping  3  17. Laundering clothes (I.e. washing, ironing, folding) 4  18. Opening a jar 1  19. Throwing a ball 1  20. Carrying a small suitcase with your affected limb.  2  Score total:  44      COGNITION: Overall cognitive status: Within functional limits for tasks assessed     SENSATION: WFL  POSTURE: Mild kyphosis, rounded shoulders, guarded R shoulder with shrugging position  UPPER EXTREMITY ROM:   Active ROM Right eval Left eval  Shoulder flexion 130 155  Shoulder extension    Shoulder abduction 140 155  Shoulder adduction    Shoulder internal rotation To belly To belly  Shoulder external rotation 30 60  Elbow flexion    Elbow extension    Wrist flexion    Wrist extension    Wrist ulnar deviation    Wrist radial deviation    Wrist pronation    Wrist supination    (Blank rows = not tested)  UPPER EXTREMITY MMT: not tested 2/2 to surgical healing precautions   JOINT MOBILITY TESTING:  R GHJ stiffness as expected in inf and post post op  PALPATION:  No TTP along deltoid or biceps Hypertonicity of biceps belly                                                                                                                               TREATMENT DATE:   8/5  UBE retro and fwd 3  min each  R STM deltoid R GHJ inf mob grade III Crossbody stretch 30s 3x Pec stretch 30s 3x  Wall side plank 4x 10x Table push up 2x10.  Reduction of weight in the gym  7/28 Pulley warm up  Seated unilateral cable row 3x6-8 Cable bicep curl 3x8 10lbs (with bar) Lateral raise 3lbs 3x8 Table push up plus 3x8 RTB star  3x5 YTB ER/ER 90/90 2x10   UBE ea fwd/back L3 PROM R shoulder Supine flexion 2#x10, 3# 2x10 Sidelying abduction 3# 2x10 S/l ER 3# 2x10 Supine SA punch 3# 2x15 Supine ABC 2# x2 Standing flexion 2x10 2#  Standing press out 2# 2x10 Wall push ups 2x10 OH press 5# 2x10 Bicep curls 8# 3x10 Triceps press down  45lbs x10 55lbs 2x10 Row machine 35lbs 3x10 Punches 3# 2x10  7/23 UBE ea fwd/back L3 PROM R shoulder Supine flexion 2#x10, 3# 2x10 Sidelying abduction 3# 2x10 S/l ER 3# 2x10 Supine SA punch 3# 2x15 Supine ABC 2# x2 Standing flexion 2x10 2#  Standing press out 2# 2x10 Wall push ups 2x10 OH press 5# 2x10 Bicep curls 8# 3x10 Triceps press down  45lbs x10 55lbs 2x10 Row machine 35lbs 3x10 Punches 3# 2x10   7/21 Pulley warm up 2 min flexion and abduction PROM R shoulder Supine flexion 2#x10, 3# 2x10 Sidelying abduction 3# 2x10 S/l ER 3# 2x10 Supine SA punch 2# 2x10 Supine ABC 2# x2 Standing flexion 2x10 Standing abduction 2x10 Theraband row 2x10 GTB Standing press out 2# 2x10 IR behind back with strap 10seconds x10 Wall push ups 2x10 Ball rolls at wall 4way 2.2# ball x20ea       7/16  Pulley warm up flexion and ABD 2 min ea  S/L ER 10x no weight; 2x10 with 1lb ABC 1lb 2x  Supine flexion 1# 2x10 S/L ABD 2x10 1# Standing IR YTB 3x10 Wall push up 2x10 IR BHB stretch 10s 10x   7/14 Pulley warm up 2 min flexion  PROM R shoulder Supine rhythmic stabilization at 90deg Supine flexion 0#x10, 1# x10 Supine SA punch 2# 2x10 Supine ABC 2# Standing flexion 2x10 Standing abduction 2x10 Theraband row 2x10 GTB Standing  press out 1# 2x10 IR behind back with strap 10seconds x5 Bent over T 2x10    7/9  Pulley warm up 2 min flexion and ABD  YTB rowing 3x10 Scaption 2x10 AROM Flexion AROM 2x10 Doorway pec arm low 30s 3x Bent over T 2x10 Short lever shoulder ABD in standing 2x10 AROM ER 2x10 standing bilat   7/2   Exercises - Standing Shoulder Abduction AAROM with Dowel  - 2 x daily -  7 x weekly - 2 sets - 10 reps - Shoulder Flexion Overhead with Dowel  - 2 x daily - 7 x weekly - 2 sets - 10 reps - Shoulder Scaption AAROM with Dowel  - 2 x daily - 7 x weekly - 2 sets - 10 reps - Bicep Stretch at Table  - 2 x daily - 7 x weekly - 1 sets - 3 reps - 30 hold - Standing Shoulder Flexion Wall Walk  - 2 x daily - 7 x weekly - 2 sets - 10 reps    PATIENT EDUCATION: Education details: surgical protocol, signs of infection, cryotherapy, edema management, joint protection, diagnosis, prognosis, anatomy, exercise progression, DOMS expectations, muscle firing,  envelope of function, HEP, POC Person educated: Patient Education method: Explanation, Demonstration, Tactile cues, Verbal cues, and Handouts Education comprehension: verbalized understanding, returned demonstration, verbal cues required, tactile cues required, and needs further education     HOME EXERCISE PROGRAM:    Access Code: [email or web address] URL: [email or web address] Date: 03/21/2024 Prepared by: Dale Call      ASSESSMENT:   CLINICAL IMPRESSION:   11 wks post op. Pt returns today after being able to trial gym exercise 2x/week. Pt does report using double UE for 30-50lbs which is greater than should be loaded at this time. Edu provided on reducing RPE for now and will progress tolerated in the coming weeks. Pt does respond well to self stretching and progressive ROM. Plan to update HEP at next and consider drop in frequency as pt is well into strengthening phase of rehab. Pt would benefit from continued skilled therapy in order  to reach goals and maximize functional R UE strength and ROM for return to PLOF, occupation, and normalized ADL.       Surgical note has been scanned into patient's medical chart.   OBJECTIVE IMPAIRMENTS decreased activity tolerance, decreased ROM, decreased strength, increased muscle spasms, impaired UE functional use, improper body mechanics, postural dysfunction, and pain.    ACTIVITY LIMITATIONS cleaning, lifting, bending, carry, dressing, bathing, feeding, community activity, meal prep, laundry, and yard work.    PERSONAL FACTORS Time since onset of injury/illness/exacerbation is also affecting patient's functional outcome.     REHAB POTENTIAL: Good   CLINICAL DECISION MAKING: Stable/uncomplicated   EVALUATION COMPLEXITY: Low     GOALS:     SHORT TERM GOALS: Target date: 05/02/2024        Pt will become independent with HEP in order to demonstrate synthesis of PT education.   Goal status: INITIAL   2.  Pt will be able to demonstrate full PROM  in order to demonstrate functional improvement in UE for progression to next phase of protocol.     Goal status: INITIAL   3.  Pt will report at least 2 pt reduction on NPRS scale for pain in order to demonstrate functional improvement with household activity, self care, and ADL.    Goal status: INITIAL       LONG TERM GOALS: Target date: 06/19/2024       Pt  will become independent with final HEP in order to demonstrate synthesis of PT education.   Goal status: INITIAL   2.  Pt will be able to reach [facility] and carry/hold >15lbs in order to demonstrate functional improvement in R UE strength for return to household duties and ADL.SABRA    Goal status: INITIAL   3.  Pt will be able to demonstrate full [place] AROM of the R shoulder in order to demonstrate functional  improvement in UE function for self-care and house hold duties.    Goal status: INITIAL   4.  Pt will test to within 80% HHD of L UE in order to demonstrate functional   UE strength improvement and limb symmetry.     Goal status: INITIAL      PLAN: PT FREQUENCY: 1-2x/week   PT DURATION: 12 weeks   PLANNED INTERVENTIONS: Therapeutic exercises, Therapeutic activity, Neuromuscular re-education, Patient/Family education, Joint mobilization, Dry Needling, Spinal mobilization, Cryotherapy, Moist heat, Taping, and Manual therapy, Re-evaluation   PLAN FOR NEXT SESSION:  A/AROM per [facility] repair protocol limits; consider rowing, review HEP     Dale Call, PT 04/24/2024, 9:27 AM

## 2024-04-26 ENCOUNTER — Encounter (HOSPITAL_BASED_OUTPATIENT_CLINIC_OR_DEPARTMENT_OTHER): Payer: Self-pay | Admitting: Physical Therapy

## 2024-04-26 ENCOUNTER — Ambulatory Visit (HOSPITAL_BASED_OUTPATIENT_CLINIC_OR_DEPARTMENT_OTHER): Admitting: Physical Therapy

## 2024-04-26 DIAGNOSIS — M6281 Muscle weakness (generalized): Secondary | ICD-10-CM

## 2024-04-26 DIAGNOSIS — M25611 Stiffness of right shoulder, not elsewhere classified: Secondary | ICD-10-CM

## 2024-04-26 DIAGNOSIS — M25511 Pain in right shoulder: Secondary | ICD-10-CM

## 2024-04-26 NOTE — Therapy (Signed)
 OUTPATIENT PHYSICAL THERAPY UPPER EXTREMITY TREATMENT   Patient Name: Donald Schwartz MRN: [id number] DOB:05-04-1966, 58 y.o., male Today's Date: 04/26/2024  END OF SESSION:  PT End of Session - 04/26/24 0927     Visit Number 9    Number of Visits 21    Date for PT Re-Evaluation 06/19/24    Authorization Type [place]    PT Start Time 0930    PT Stop Time 1008    PT Time Calculation (min) 38 min    Activity Tolerance Patient tolerated treatment well    Behavior During Therapy [facility] for tasks assessed/performed                Past Medical History:  Diagnosis Date   Allergy    Arthritis    Cellulitis    Elevated hemoglobin A1c    Elevated LDL cholesterol level    Hx of colonic polyp 11/20/2014   Hyperlipidemia    Hypertension    Hypogonadism male    Neuromuscular disorder (HCC)    Trigeminar neuralgia   Sleep apnea    wears CPAP   Vitamin D  deficiency    Past Surgical History:  Procedure Laterality Date   c5-6 fusion     2012   COLONOSCOPY  01/30/2018   Dr.Gessner   HIP FRACTURE SURGERY Right 1986   avulsion/chip fracture ?stress fracture   POLYPECTOMY     Patient Active Problem List   Diagnosis Date Noted   Fatty liver 11/26/2020   Morbid obesity (HCC) - BMI 30+ with OSA 02/03/2016   Medication management 01/10/2015   Trigeminal neuralgia of right side of face 01/10/2015   Hx of colonic polyp + FHx CRCA 11/20/2014   CKD (chronic kidney disease) stage 2, GFR 60-89 ml/min 10/11/2014   OSA on CPAP 01/07/2014   Vitamin D  deficiency 01/06/2014   Hypertension    Hyperlipidemia    Other abnormal glucose (prediabetes)     PCP:   Chrystal Lamarr RAMAN, MD    REFERRING PROVIDER: Canda Franky Jurist, MD   REFERRING DIAG:  [phone] (ICD-10-CM) - Complete rotator cuff tear or rupture of right shoulder, not specified as traumatic      THERAPY DIAG:  Decreased right shoulder range of motion  Muscle weakness (generalized)  Pain in joint of right  shoulder  Rationale for Evaluation and Treatment: Rehabilitation  ONSET DATE: 02/03/2024 DOS  s/p Right shoulder  arthroscopic supraspinatus and subscapularis tendon repair  (per office visit note)   SUBJECTIVE:  SUBJECTIVE STATEMENT:  Pt without complaints of pain since last session. Reduced weight in the gym and did not have irritation.     Eval: Pt states its was a moderate sized tear. MD repaired rotator cuff, debrided the shoulder for the bursitis and does not report biceps tendon procedure. Dr. Canda is Careers adviser. Pt just recently came out of the sling this week after virtual visit with PA. He has been doing pendulums, table slides, and wall walks- 4x/day for 10 reps. Pt is sleeping in bed now and is back sleeper.  Incisions are closed and well healed.   Hand dominance: Right  PERTINENT HISTORY: HLD, HTN, recurrent cellulitis, CKD  PAIN:  Are you having pain? no: NPRS scale: 0/10 at rest; worst 6/10 Pain location: posterior shoulder, biceps Pain description: aching/ sore/[place] Aggravating factors: movement Relieving factors: rest, meds, icing  PRECAUTIONS: Shoulder  RED FLAGS: None   WEIGHT BEARING RESTRICTIONS: Yes WBAT now at 6 wks post op  FALLS:  Has patient fallen in last 6 months? 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  DIAGNOSTIC FINDINGS:  N/A in chart; MRI performed at [facility]  PATIENT SURVEYS :  UEFS  Extreme difficulty/unable (0), Quite a bit of  difficulty (1), Moderate difficulty (2), Little difficulty (3), No difficulty (4) Survey date:  eval  Any of your usual work, household or school activities 1  2. Your usual hobbies, recreational/sport activities 1   3. Lifting a bag of groceries to waist level 2   4. Lifting a bag of groceries above your head 3  5. Grooming your hair 2  6. Pushing up on your hands (I.e. from bathtub or chair) 1  7. Preparing food (I.e. peeling/cutting) 2  8. Driving  0  9. Vacuuming, sweeping, or raking 2  10. Dressing  3  11. Doing up buttons 4  12. Using tools/appliances 2  13. Opening doors 3  14. Cleaning  3  15. Tying or lacing shoes 4  16. Sleeping  3  17. Laundering clothes (I.e. washing, ironing, folding) 4  18. Opening a jar 1  19. Throwing a ball 1  20. Carrying a small suitcase with your affected limb.  2  Score total:  44      COGNITION: Overall cognitive status: Within functional limits for tasks assessed     SENSATION: WFL  POSTURE: Mild kyphosis, rounded shoulders, guarded R shoulder with shrugging position  UPPER EXTREMITY ROM:   Active ROM Right eval Left eval  Shoulder flexion 130 155  Shoulder extension    Shoulder abduction 140 155  Shoulder adduction    Shoulder internal rotation To belly To belly  Shoulder external rotation 30 60  Elbow flexion    Elbow extension    Wrist flexion    Wrist extension    Wrist ulnar deviation    Wrist radial deviation    Wrist pronation    Wrist supination    (Blank rows = not tested)  UPPER EXTREMITY MMT: not tested 2/2 to surgical healing precautions   JOINT MOBILITY TESTING:  R GHJ stiffness as expected in inf and post post op  PALPATION:  No TTP along deltoid  or biceps Hypertonicity of biceps belly                                                                                                                               TREATMENT DATE:  8/7  UBE retro and fwd 3 min each   High cable row 10lbs 10x,  2x10 15lbs  Incline cable prss 5lbs 3x8 Table plank taps 3x12 3lb ABD full arc 2x10 Wall angel 3x8 Wall Y with lift off 2x10  8/5  UBE retro and fwd 3 min each  R STM deltoid R GHJ inf mob grade III Crossbody stretch 30s 3x Pec stretch 30s 3x  Wall side plank 4x 10x Table push up 2x10.  Reduction of weight in the gym  7/28 Pulley warm up  Seated unilateral cable row 3x6-8 Cable bicep curl 3x8 10lbs (with bar) Lateral raise 3lbs 3x8 Table push up plus 3x8 RTB star  3x5 YTB ER/ER 90/90 2x10   UBE ea fwd/back L3 PROM R shoulder Supine flexion 2#x10, 3# 2x10 Sidelying abduction 3# 2x10 S/l ER 3# 2x10 Supine SA punch 3# 2x15 Supine ABC 2# x2 Standing flexion 2x10 2#  Standing press out 2# 2x10 Wall push ups 2x10 OH press 5# 2x10 Bicep curls 8# 3x10 Triceps press down  45lbs x10 55lbs 2x10 Row machine 35lbs 3x10 Punches 3# 2x10  7/23 UBE ea fwd/back L3 PROM R shoulder Supine flexion 2#x10, 3# 2x10 Sidelying abduction 3# 2x10 S/l ER 3# 2x10 Supine SA punch 3# 2x15 Supine ABC 2# x2 Standing flexion 2x10 2#  Standing press out 2# 2x10 Wall push ups 2x10 OH press 5# 2x10 Bicep curls 8# 3x10 Triceps press down  45lbs x10 55lbs 2x10 Row machine 35lbs 3x10 Punches 3# 2x10   7/21 Pulley warm up 2 min flexion and abduction PROM R shoulder Supine flexion 2#x10, 3# 2x10 Sidelying abduction 3# 2x10 S/l ER 3# 2x10 Supine SA punch 2# 2x10 Supine ABC 2# x2 Standing flexion 2x10 Standing abduction 2x10 Theraband row 2x10 GTB Standing press out 2# 2x10 IR behind back with strap 10seconds x10 Wall push ups 2x10 Ball rolls at wall 4way 2.2# ball x20ea       7/16  Pulley warm up flexion and ABD 2 min ea  S/L ER 10x no weight; 2x10 with 1lb ABC 1lb 2x  Supine flexion 1# 2x10 S/L ABD 2x10 1# Standing IR YTB 3x10 Wall push up 2x10 IR BHB stretch 10s 10x   7/14 Pulley warm up 2 min flexion  PROM R shoulder Supine rhythmic stabilization  at 90deg Supine flexion 0#x10, 1# x10 Supine SA punch 2# 2x10 Supine ABC 2# Standing flexion 2x10 Standing abduction 2x10 Theraband row 2x10 GTB Standing press out 1# 2x10 IR behind back with strap 10seconds x5 Bent over T 2x10    7/9  Pulley warm up 2 min flexion and ABD  YTB rowing 3x10 Scaption 2x10 AROM Flexion AROM 2x10 Doorway pec  arm low 30s 3x Bent over T 2x10 Short lever shoulder ABD in standing 2x10 AROM ER 2x10 standing bilat   7/2   Exercises - Standing Shoulder Abduction AAROM with Dowel  - 2 x daily - 7 x weekly - 2 sets - 10 reps - Shoulder Flexion Overhead with Dowel  - 2 x daily - 7 x weekly - 2 sets - 10 reps - Shoulder Scaption AAROM with Dowel  - 2 x daily - 7 x weekly - 2 sets - 10 reps - Bicep Stretch at Table  - 2 x daily - 7 x weekly - 1 sets - 3 reps - 30 hold - Standing Shoulder Flexion Wall Walk  - 2 x daily - 7 x weekly - 2 sets - 10 reps    PATIENT EDUCATION: Education details: surgical protocol, signs of infection, cryotherapy, edema management, joint protection, diagnosis, prognosis, anatomy, exercise progression, DOMS expectations, muscle firing,  envelope of function, HEP, POC Person educated: Patient Education method: Explanation, Demonstration, Tactile cues, Verbal cues, and Handouts Education comprehension: verbalized understanding, returned demonstration, verbal cues required, tactile cues required, and needs further education     HOME EXERCISE PROGRAM:    Access Code: [email or web address] URL: [email or web address] Date: 03/21/2024 Prepared by: Dale Call      ASSESSMENT:   CLINICAL IMPRESSION:   Pt 12 wks post op.  Patient with good tolerance to progressive exercises today session.  Patient able to perform angled rowing and pressing with good technique and scapular control without pain.  Overhead progression updated today with reduction in intensity accordingly.  Patient advised he may begin full arc of motion with  light resistance and to be aware of volume on the shoulder as he increases ADL and gym-based exercise.  Patient advised that he may reduce frequency with gym related exercises twice a week if he begins to develop symptoms of a tendinitis.  Patient able to drop to 1 time per week frequency as he is well into the strengthening phase and is independent with gym exercise.  Plan for formal measurements at next visit and progress note.  Pt would benefit from continued skilled therapy in order to reach goals and maximize functional R UE strength and ROM for return to PLOF, occupation, and normalized ADL.       Surgical note has been scanned into patient's medical chart.   OBJECTIVE IMPAIRMENTS decreased activity tolerance, decreased ROM, decreased strength, increased muscle spasms, impaired UE functional use, improper body mechanics, postural dysfunction, and pain.    ACTIVITY LIMITATIONS cleaning, lifting, bending, carry, dressing, bathing, feeding, community activity, meal prep, laundry, and yard work.    PERSONAL FACTORS Time since onset of injury/illness/exacerbation is also affecting patient's functional outcome.     REHAB POTENTIAL: Good   CLINICAL DECISION MAKING: Stable/uncomplicated   EVALUATION COMPLEXITY: Low     GOALS:     SHORT TERM GOALS: Target date: 05/02/2024        Pt will become independent with HEP in order to demonstrate synthesis of PT education.   Goal status: MEt   2.  Pt will be able to demonstrate full PROM  in order to demonstrate functional improvement in UE for progression to next phase of protocol.     Goal status: MET   3.  Pt will report at least 2 pt reduction on NPRS scale for pain in order to demonstrate functional improvement with household activity, self care, and ADL.    Goal status: MET  LONG TERM GOALS: Target date: 06/19/2024       Pt  will become independent with final HEP in order to demonstrate synthesis of PT education.   Goal  status: INITIAL   2.  Pt will be able to reach [facility] and carry/hold >15lbs in order to demonstrate functional improvement in R UE strength for return to household duties and ADL.[email or web address]    Goal status: INITIAL   3.  Pt will be able to demonstrate full OH AROM of the R shoulder in order to demonstrate functional improvement in UE function for self-care and house hold duties.    Goal status: INITIAL   4.  Pt will test to within 80% HHD of L UE in order to demonstrate functional  UE strength improvement and limb symmetry.     Goal status: INITIAL      PLAN: PT FREQUENCY: 1-2x/week   PT DURATION: 12 weeks   PLANNED INTERVENTIONS: Therapeutic exercises, Therapeutic activity, Neuromuscular re-education, Patient/Family education, Joint mobilization, Dry Needling, Spinal mobilization, Cryotherapy, Moist heat, Taping, and Manual therapy, Re-evaluation   PLAN FOR NEXT SESSION:  PN, progressive OH and general R UE strength, OH scap stability and strength    Dale Call, PT 04/26/2024, 10:12 AM

## 2024-04-30 ENCOUNTER — Encounter (HOSPITAL_BASED_OUTPATIENT_CLINIC_OR_DEPARTMENT_OTHER)

## 2024-05-03 ENCOUNTER — Ambulatory Visit (HOSPITAL_BASED_OUTPATIENT_CLINIC_OR_DEPARTMENT_OTHER): Admitting: Physical Therapy

## 2024-05-03 ENCOUNTER — Encounter (HOSPITAL_BASED_OUTPATIENT_CLINIC_OR_DEPARTMENT_OTHER): Payer: Self-pay | Admitting: Physical Therapy

## 2024-05-03 DIAGNOSIS — M25511 Pain in right shoulder: Secondary | ICD-10-CM

## 2024-05-03 DIAGNOSIS — M25611 Stiffness of right shoulder, not elsewhere classified: Secondary | ICD-10-CM

## 2024-05-03 DIAGNOSIS — M6281 Muscle weakness (generalized): Secondary | ICD-10-CM

## 2024-05-03 NOTE — Therapy (Signed)
 OUTPATIENT PHYSICAL THERAPY UPPER EXTREMITY TREATMENT   Patient Name: Donald Schwartz MRN: [id number] DOB:[birth year], 58 y.o., male Today's Date: 05/03/2024  END OF SESSION:  PT End of Session - 05/03/24 0935     Visit Number 10    Number of Visits 21    Date for PT Re-Evaluation 06/19/24    Authorization Type [place]    PT Start Time 0930    PT Stop Time 1015    PT Time Calculation (min) 45 min    Activity Tolerance Patient tolerated treatment well    Behavior During Therapy [facility] for tasks assessed/performed                 Past Medical History:  Diagnosis Date   Allergy    Arthritis    Cellulitis    Elevated hemoglobin A1c    Elevated LDL cholesterol level    Hx of colonic polyp 11/20/2014   Hyperlipidemia    Hypertension    Hypogonadism male    Neuromuscular disorder (HCC)    Trigeminar neuralgia   Sleep apnea    wears CPAP   Vitamin D  deficiency    Past Surgical History:  Procedure Laterality Date   c5-6 fusion     2012   COLONOSCOPY  01/30/2018   Dr.Gessner   HIP FRACTURE SURGERY Right 1986   avulsion/chip fracture ?stress fracture   POLYPECTOMY     Patient Active Problem List   Diagnosis Date Noted   Fatty liver 11/26/2020   Morbid obesity (HCC) - BMI 30+ with OSA 02/03/2016   Medication management 01/10/2015   Trigeminal neuralgia of right side of face 01/10/2015   Hx of colonic polyp + FHx CRCA 11/20/2014   CKD (chronic kidney disease) stage 2, GFR 60-89 ml/min 10/11/2014   OSA on CPAP 01/07/2014   Vitamin D  deficiency 01/06/2014   Hypertension    Hyperlipidemia    Other abnormal glucose (prediabetes)     PCP:   Chrystal Lamarr RAMAN, MD    REFERRING PROVIDER: Canda Franky Jurist, MD   REFERRING DIAG:  [phone] (ICD-10-CM) - Complete rotator cuff tear or rupture of right shoulder, not specified as traumatic      THERAPY DIAG:  Decreased right shoulder range of motion  Muscle weakness (generalized)  Pain in joint of right  shoulder  Rationale for Evaluation and Treatment: Rehabilitation  ONSET DATE: 02/03/2024 DOS  s/p Right shoulder  arthroscopic supraspinatus and subscapularis tendon repair  (per office visit note)   SUBJECTIVE:  SUBJECTIVE STATEMENT:  Pt reports gym soreness but no complaints others. Pt attempted an OHP but was not able, even on lightest setting.     Eval: Pt states its was a moderate sized tear. MD repaired rotator cuff, debrided the shoulder for the bursitis and does not report biceps tendon procedure. Dr. Canda is Careers adviser. Pt just recently came out of the sling this week after virtual visit with PA. He has been doing pendulums, table slides, and wall walks- 4x/day for 10 reps. Pt is sleeping in bed now and is back sleeper.  Incisions are closed and well healed.   Hand dominance: Right  PERTINENT HISTORY: HLD, HTN, recurrent cellulitis, CKD  PAIN:  Are you having pain? no: NPRS scale: 0/10 at rest; worst 6/10 Pain location: posterior shoulder, biceps Pain description: aching/ sore/Ziehm Aggravating factors: movement Relieving factors: rest, meds, icing  PRECAUTIONS: Shoulder  RED FLAGS: None   WEIGHT BEARING RESTRICTIONS: Yes WBAT now at 6 wks post op  FALLS:  Has patient fallen in last 6 months? 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  DIAGNOSTIC FINDINGS:  N/A in chart; MRI performed at [facility]  PATIENT SURVEYS :  UEFS  Extreme difficulty/unable (0), Quite  a bit of difficulty (1), Moderate difficulty (2), Little difficulty (3), No difficulty (4) Survey date:  eval 8/14  Any of your usual work, household or school activities 1 3  2. Your usual hobbies, recreational/sport activities 1 3   3. Lifting a bag of groceries to waist level 2 3   4. Lifting a bag of groceries above your head 3 3  5. Grooming your hair 2 4  6. Pushing up on your hands (I.e. from bathtub or chair) 1 3  7. Preparing food (I.e. peeling/cutting) 2 4  8. Driving  0 4  9. Vacuuming, sweeping, or raking 2 3  10. Dressing  3 4  11. Doing up buttons 4 4  12. Using tools/appliances 2 3  13. Opening doors 3 4  14. Cleaning  3 3  15. Tying or lacing shoes 4 4  16. Sleeping  3 4  17. Laundering clothes (I.e. washing, ironing, folding) 4 4  18. Opening a jar 1 3  19. Throwing a ball 1 2  20. Carrying a small suitcase with your affected limb.  2 3  Score total:  44  68/80     COGNITION: Overall cognitive status: Within functional limits for tasks assessed     SENSATION: WFL  POSTURE: Mild kyphosis, rounded shoulders, guarded R shoulder with shrugging position  UPPER EXTREMITY ROM:   Active ROM Right eval Left eval  Shoulder flexion 150 155  Shoulder extension    Shoulder abduction 155 155  Shoulder adduction    Shoulder internal rotation To belly To belly  Shoulder external rotation 60 60  Elbow flexion    Elbow extension    Wrist flexion    Wrist extension    Wrist ulnar deviation    Wrist radial deviation    Wrist pronation    Wrist supination    (Blank rows = not tested)  UPPER EXTREMITY MMT:  MMT In lbs Right 8/14  Left 8/14  Shoulder flexion 28.0 44.6  Shoulder extension 40.1 57.1  Shoulder abduction 26.7 45.8  Shoulder adduction    Shoulder internal rotation 26.0 31.9  Shoulder external rotation 14.9 22.1  Elbow flexion 37.3 64.0  Elbow extension    Wrist flexion    Wrist extension    Wrist ulnar deviation    Wrist radial deviation     Wrist pronation    Wrist supination    (Blank rows = not tested)    JOINT MOBILITY TESTING:  R GHJ stiffness as expected in inf and post post op  PALPATION:  No TTP along deltoid or biceps Hypertonicity of biceps belly                                                                                                                               TREATMENT DATE:  8/14  UBE retro and fwd 1 min each; 6 min total  Quadruped protraction 15x Quadruped alt LE lift 3x10 Table 90/90 eccentrics ER 4lbs 3x8 Arnold press 2lbs 3x8 Table side plank 3x5 2s holds Pec stretch 120 deg 30s 3x  8/7  UBE retro and fwd 3 min each   High cable row 10lbs 10x, 2x10 15lbs  Incline cable prss 5lbs 3x8 Table plank taps 3x12 3lb ABD full arc 2x10 Wall angel 3x8 Wall Y with lift off 2x10  8/5  UBE retro and fwd 3 min each  R STM deltoid R GHJ inf mob grade III Crossbody stretch 30s 3x Pec stretch 30s 3x  Wall side plank 4x 10x Table push up 2x10.  Reduction of weight in the gym  7/28 Pulley warm up  Seated unilateral cable row 3x6-8 Cable bicep curl 3x8 10lbs (with bar) Lateral raise 3lbs 3x8 Table push up plus 3x8 RTB star  3x5 YTB ER/ER 90/90 2x10   UBE ea fwd/back L3 PROM R shoulder Supine flexion 2#x10, 3# 2x10 Sidelying abduction 3# 2x10 S/l ER 3# 2x10 Supine SA punch 3# 2x15 Supine ABC 2# x2 Standing flexion 2x10 2#  Standing press out 2# 2x10 Wall push ups 2x10 OH press 5# 2x10 Bicep curls 8# 3x10 Triceps press down  45lbs x10 55lbs 2x10 Row machine 35lbs 3x10 Punches 3# 2x10  7/23 UBE ea fwd/back L3 PROM R shoulder Supine flexion 2#x10, 3# 2x10 Sidelying abduction 3# 2x10 S/l ER 3# 2x10 Supine SA punch 3# 2x15 Supine ABC 2# x2 Standing flexion 2x10 2#  Standing press out 2# 2x10 Wall push ups 2x10 OH press 5# 2x10 Bicep curls 8# 3x10 Triceps press down  45lbs x10 55lbs 2x10 Row machine 35lbs 3x10 Punches 3# 2x10   7/21 Pulley warm up  2 min flexion and abduction PROM R shoulder Supine flexion 2#x10, 3# 2x10 Sidelying abduction 3# 2x10 S/l ER 3# 2x10 Supine SA punch 2# 2x10 Supine ABC 2# x2 Standing flexion 2x10 Standing abduction 2x10 Theraband row 2x10 GTB Standing press out 2# 2x10 IR  behind back with strap 10seconds x10 Wall push ups 2x10 Ball rolls at wall 4way 2.2# ball x20ea       7/16  Pulley warm up flexion and ABD 2 min ea  S/L ER 10x no weight; 2x10 with 1lb ABC 1lb 2x  Supine flexion 1# 2x10 S/L ABD 2x10 1# Standing IR YTB 3x10 Wall push up 2x10 IR BHB stretch 10s 10x   7/14 Pulley warm up 2 min flexion  PROM R shoulder Supine rhythmic stabilization at 90deg Supine flexion 0#x10, 1# x10 Supine SA punch 2# 2x10 Supine ABC 2# Standing flexion 2x10 Standing abduction 2x10 Theraband row 2x10 GTB Standing press out 1# 2x10 IR behind back with strap 10seconds x5 Bent over T 2x10    7/9  Pulley warm up 2 min flexion and ABD  YTB rowing 3x10 Scaption 2x10 AROM Flexion AROM 2x10 Doorway pec arm low 30s 3x Bent over T 2x10 Short lever shoulder ABD in standing 2x10 AROM ER 2x10 standing bilat   7/2   Exercises - Standing Shoulder Abduction AAROM with Dowel  - 2 x daily - 7 x weekly - 2 sets - 10 reps - Shoulder Flexion Overhead with Dowel  - 2 x daily - 7 x weekly - 2 sets - 10 reps - Shoulder Scaption AAROM with Dowel  - 2 x daily - 7 x weekly - 2 sets - 10 reps - Bicep Stretch at Table  - 2 x daily - 7 x weekly - 1 sets - 3 reps - 30 hold - Standing Shoulder Flexion Wall Walk  - 2 x daily - 7 x weekly - 2 sets - 10 reps    PATIENT EDUCATION: Education details: surgical protocol, signs of infection, cryotherapy, edema management, joint protection, diagnosis, prognosis, anatomy, exercise progression, DOMS expectations, muscle firing,  envelope of function, HEP, POC Person educated: Patient Education method: Explanation, Demonstration, Tactile cues, Verbal cues, and  Handouts Education comprehension: verbalized understanding, returned demonstration, verbal cues required, tactile cues required, and needs further education     HOME EXERCISE PROGRAM:    Access Code: [email or web address] URL: [email or web address] Date: 03/21/2024 Prepared by: Dale Call      ASSESSMENT:   CLINICAL IMPRESSION:   Pt 13 wks post op.  Pt with good subjective and objective improvement in ROM and strength. Pt is limited in CKC shoulder stability and does continue to have expected weakness but is progressing well. HHD testing shows continued deficits but pt is strengthening in the gym setting with PT supervision without complaints. HEP updated today and pt to continue with progressive cuff, scapular, and dynamic OH strength as tolerated. Consider progressive CKC, scapular strength exercise, and progressive free weight exercise as pt is in the gym and returns to work 9/2.  Pt would benefit from continued skilled therapy in order to reach goals and maximize functional R UE strength and ROM for return to PLOF, occupation, and normalized ADL.       Surgical note has been scanned into patient's medical chart.   OBJECTIVE IMPAIRMENTS decreased activity tolerance, decreased ROM, decreased strength, increased muscle spasms, impaired UE functional use, improper body mechanics, postural dysfunction, and pain.    ACTIVITY LIMITATIONS cleaning, lifting, bending, carry, dressing, bathing, feeding, community activity, meal prep, laundry, and yard work.    PERSONAL FACTORS Time since onset of injury/illness/exacerbation is also affecting patient's functional outcome.     REHAB POTENTIAL: Good   CLINICAL DECISION MAKING: Stable/uncomplicated   EVALUATION COMPLEXITY: Low     GOALS:  SHORT TERM GOALS: Target date: 05/02/2024        Pt will become independent with HEP in order to demonstrate synthesis of PT education.   Goal status: MEt   2.  Pt will be able to demonstrate  full PROM  in order to demonstrate functional improvement in UE for progression to next phase of protocol.     Goal status: MET   3.  Pt will report at least 2 pt reduction on NPRS scale for pain in order to demonstrate functional improvement with household activity, self care, and ADL.    Goal status: MET       LONG TERM GOALS: Target date: 06/19/2024       Pt  will become independent with final HEP in order to demonstrate synthesis of PT education.   Goal status: ongoing   2.  Pt will be able to reach [facility] and carry/hold >15lbs in order to demonstrate functional improvement in R UE strength for return to household duties and ADL.[email or web address]    Goal status: ongoing   3.  Pt will be able to demonstrate full OH AROM of the R shoulder in order to demonstrate functional improvement in UE function for self-care and house hold duties.    Goal status: ongoing   4.  Pt will test to within 80% HHD of L UE in order to demonstrate functional  UE strength improvement and limb symmetry.     Goal status: ongoing      PLAN: PT FREQUENCY: 1-2x/week   PT DURATION: 12 weeks   PLANNED INTERVENTIONS: Therapeutic exercises, Therapeutic activity, Neuromuscular re-education, Patient/Family education, Joint mobilization, Dry Needling, Spinal mobilization, Cryotherapy, Moist heat, Taping, and Manual therapy, Re-evaluation   PLAN FOR NEXT SESSION:   progressive OH and general R UE strength, OH scap stability and strength    Dale Call, PT 05/03/2024, 10:41 AM

## 2024-05-07 ENCOUNTER — Encounter (HOSPITAL_BASED_OUTPATIENT_CLINIC_OR_DEPARTMENT_OTHER): Admitting: Physical Therapy

## 2024-05-10 ENCOUNTER — Encounter (HOSPITAL_BASED_OUTPATIENT_CLINIC_OR_DEPARTMENT_OTHER): Payer: Self-pay

## 2024-05-10 ENCOUNTER — Ambulatory Visit (HOSPITAL_BASED_OUTPATIENT_CLINIC_OR_DEPARTMENT_OTHER)

## 2024-05-10 DIAGNOSIS — M25611 Stiffness of right shoulder, not elsewhere classified: Secondary | ICD-10-CM | POA: Diagnosis not present

## 2024-05-10 DIAGNOSIS — M6281 Muscle weakness (generalized): Secondary | ICD-10-CM

## 2024-05-10 DIAGNOSIS — M25511 Pain in right shoulder: Secondary | ICD-10-CM

## 2024-05-10 NOTE — Therapy (Signed)
 OUTPATIENT PHYSICAL THERAPY UPPER EXTREMITY TREATMENT   Patient Name: Donald Schwartz MRN: [id number] DOB:[birth year], 58 y.o., male Today's Date: 05/10/2024  END OF SESSION:  PT End of Session - 05/10/24 0925     Visit Number 11    Number of Visits 21    Date for PT Re-Evaluation 06/19/24    Authorization Type [place]    PT Start Time 0933    PT Stop Time 1015    PT Time Calculation (min) 42 min    Activity Tolerance Patient tolerated treatment well    Behavior During Therapy [facility] for tasks assessed/performed                 Past Medical History:  Diagnosis Date   Allergy    Arthritis    Cellulitis    Elevated hemoglobin A1c    Elevated LDL cholesterol level    Hx of colonic polyp 11/20/2014   Hyperlipidemia    Hypertension    Hypogonadism male    Neuromuscular disorder (HCC)    Trigeminar neuralgia   Sleep apnea    wears CPAP   Vitamin D  deficiency    Past Surgical History:  Procedure Laterality Date   c5-6 fusion     2012   COLONOSCOPY  01/30/2018   Dr.Gessner   HIP FRACTURE SURGERY Right 1986   avulsion/chip fracture ?stress fracture   POLYPECTOMY     Patient Active Problem List   Diagnosis Date Noted   Fatty liver 11/26/2020   Morbid obesity (HCC) - BMI 30+ with OSA 02/03/2016   Medication management 01/10/2015   Trigeminal neuralgia of right side of face 01/10/2015   Hx of colonic polyp + FHx CRCA 11/20/2014   CKD (chronic kidney disease) stage 2, GFR 60-89 ml/min 10/11/2014   OSA on CPAP 01/07/2014   Vitamin D  deficiency 01/06/2014   Hypertension    Hyperlipidemia    Other abnormal glucose (prediabetes)     PCP:   Chrystal Lamarr RAMAN, MD    REFERRING PROVIDER: Canda Franky Jurist, MD   REFERRING DIAG:  [phone] (ICD-10-CM) - Complete rotator cuff tear or rupture of right shoulder, not specified as traumatic      THERAPY DIAG:  Decreased right shoulder range of motion  Muscle weakness (generalized)  Pain in joint of right  shoulder  Rationale for Evaluation and Treatment: Rehabilitation  ONSET DATE: 02/03/2024 DOS  s/p Right shoulder  arthroscopic supraspinatus and subscapularis tendon repair  (per office visit note)   SUBJECTIVE:  SUBJECTIVE STATEMENT:  Pt will return to work on 9/2 without restrictions. No c/o in shoulder at entry. Has been going to gym 2-3x/week.     Eval: Pt states its was a moderate sized tear. MD repaired rotator cuff, debrided the shoulder for the bursitis and does not report biceps tendon procedure. Dr. Canda is Careers adviser. Pt just recently came out of the sling this week after virtual visit with PA. He has been doing pendulums, table slides, and wall walks- 4x/day for 10 reps. Pt is sleeping in bed now and is back sleeper.  Incisions are closed and well healed.   Hand dominance: Right  PERTINENT HISTORY: HLD, HTN, recurrent cellulitis, CKD  PAIN:  Are you having pain? no: NPRS scale: 0/10 at rest; worst 6/10 Pain location: posterior shoulder, biceps Pain description: aching/ sore/[place] Aggravating factors: movement Relieving factors: rest, meds, icing  PRECAUTIONS: Shoulder  RED FLAGS: None   WEIGHT BEARING RESTRICTIONS: Yes WBAT now at 6 wks post op  FALLS:  Has patient fallen in last 6 months? 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  DIAGNOSTIC FINDINGS:  N/A in chart; MRI performed at [facility]  PATIENT SURVEYS :  UEFS  Extreme difficulty/unable (0), Quite  a bit of difficulty (1), Moderate difficulty (2), Little difficulty (3), No difficulty (4) Survey date:  eval 8/14  Any of your usual work, household or school activities 1 3  2. Your usual hobbies, recreational/sport activities 1 3   3. Lifting a bag of groceries to waist level 2 3   4. Lifting a bag of groceries above your head 3 3  5. Grooming your hair 2 4  6. Pushing up on your hands (I.e. from bathtub or chair) 1 3  7. Preparing food (I.e. peeling/cutting) 2 4  8. Driving  0 4  9. Vacuuming, sweeping, or raking 2 3  10. Dressing  3 4  11. Doing up buttons 4 4  12. Using tools/appliances 2 3  13. Opening doors 3 4  14. Cleaning  3 3  15. Tying or lacing shoes 4 4  16. Sleeping  3 4  17. Laundering clothes (I.e. washing, ironing, folding) 4 4  18. Opening a jar 1 3  19. Throwing a ball 1 2  20. Carrying a small suitcase with your affected limb.  2 3  Score total:  44  68/80     COGNITION: Overall cognitive status: Within functional limits for tasks assessed     SENSATION: WFL  POSTURE: Mild kyphosis, rounded shoulders, guarded R shoulder with shrugging position  UPPER EXTREMITY ROM:   Active ROM Right eval Left eval  Shoulder flexion 150 155  Shoulder extension    Shoulder abduction 155 155  Shoulder adduction    Shoulder internal rotation To belly To belly  Shoulder external rotation 60 60  Elbow flexion    Elbow extension    Wrist flexion    Wrist extension    Wrist ulnar deviation    Wrist radial deviation    Wrist pronation    Wrist supination    (Blank rows = not tested)  UPPER EXTREMITY MMT:  MMT In lbs Right 8/14  Left 8/14  Shoulder flexion 28.0 44.6  Shoulder extension 40.1 57.1  Shoulder abduction 26.7 45.8  Shoulder adduction    Shoulder internal rotation 26.0 31.9  Shoulder external rotation 14.9 22.1  Elbow flexion 37.3 64.0  Elbow extension    Wrist flexion    Wrist extension    Wrist ulnar deviation    Wrist radial deviation     Wrist pronation    Wrist supination    (Blank rows = not tested)    JOINT MOBILITY TESTING:  R GHJ stiffness as expected in inf and post post op  PALPATION:  No TTP along deltoid or biceps Hypertonicity of biceps belly                                                                                                                               TREATMENT DATE:   8/21  UBE retro and fwd 1 min each; 4 min total L4  Quadruped protraction 2x15 Table plank taps 3x12 Quadruped alt UE lift 3x10 Quadruped push ups 2x10 Table 90/90 eccentrics ER 4lbs 3x10 (Seated) Bicep curl to OH press 4# 3x10 Pec stretch 120 deg 30s 3x  Cable column: Adduction from [facility] 5#x10, 10#x10 Extension to neutral from [facility] 2x10# Fwd press out 15# 2x10     8/14  UBE retro and fwd 1 min each; 6 min total  Quadruped protraction 15x Quadruped alt LE lift 3x10 Table 90/90 eccentrics ER 4lbs 3x8 Arnold press 2lbs 3x8 Table side plank 3x5 2s holds Pec stretch 120 deg 30s 3x  8/7  UBE retro and fwd 3 min each   High cable row 10lbs 10x, 2x10 15lbs  Incline cable prss 5lbs 3x8 Table plank taps 3x12 3lb ABD full arc 2x10 Wall angel 3x8 Wall Y with lift off 2x10  8/5  UBE retro and fwd 3 min each  R STM deltoid R GHJ inf mob grade III Crossbody stretch 30s 3x Pec stretch 30s 3x  Wall side plank 4x 10x Table push up 2x10.  Reduction of weight in the gym  7/28 Pulley warm up  Seated unilateral cable row 3x6-8 Cable bicep curl 3x8 10lbs (with bar) Lateral raise 3lbs 3x8 Table push up plus 3x8 RTB star  3x5 YTB ER/ER 90/90 2x10   UBE ea fwd/back L3 PROM R shoulder Supine flexion 2#x10, 3# 2x10 Sidelying abduction 3# 2x10 S/l ER 3# 2x10 Supine SA punch 3# 2x15 Supine ABC 2# x2 Standing flexion 2x10 2#  Standing press out 2# 2x10 Wall push ups 2x10 OH press 5# 2x10 Bicep curls 8# 3x10 Triceps press down  45lbs x10 55lbs 2x10 Row machine 35lbs 3x10 Punches 3#  2x10  7/23 UBE ea fwd/back L3 PROM R shoulder Supine flexion 2#x10, 3# 2x10 Sidelying abduction 3# 2x10 S/l ER 3# 2x10 Supine SA punch 3# 2x15 Supine ABC 2# x2 Standing flexion 2x10 2#  Standing press out 2# 2x10 Wall push ups 2x10 OH  press 5# 2x10 Bicep curls 8# 3x10 Triceps press down  45lbs x10 55lbs 2x10 Row machine 35lbs 3x10 Punches 3# 2x10   7/21 Pulley warm up 2 min flexion and abduction PROM R shoulder Supine flexion 2#x10, 3# 2x10 Sidelying abduction 3# 2x10 S/l ER 3# 2x10 Supine SA punch 2# 2x10 Supine ABC 2# x2 Standing flexion 2x10 Standing abduction 2x10 Theraband row 2x10 GTB Standing press out 2# 2x10 IR behind back with strap 10seconds x10 Wall push ups 2x10 Ball rolls at wall 4way 2.2# ball x20ea       7/16  Pulley warm up flexion and ABD 2 min ea  S/L ER 10x no weight; 2x10 with 1lb ABC 1lb 2x  Supine flexion 1# 2x10 S/L ABD 2x10 1# Standing IR YTB 3x10 Wall push up 2x10 IR BHB stretch 10s 10x   7/14 Pulley warm up 2 min flexion  PROM R shoulder Supine rhythmic stabilization at 90deg Supine flexion 0#x10, 1# x10 Supine SA punch 2# 2x10 Supine ABC 2# Standing flexion 2x10 Standing abduction 2x10 Theraband row 2x10 GTB Standing press out 1# 2x10 IR behind back with strap 10seconds x5 Bent over T 2x10    7/9  Pulley warm up 2 min flexion and ABD  YTB rowing 3x10 Scaption 2x10 AROM Flexion AROM 2x10 Doorway pec arm low 30s 3x Bent over T 2x10 Short lever shoulder ABD in standing 2x10 AROM ER 2x10 standing bilat   7/2   Exercises - Standing Shoulder Abduction AAROM with Dowel  - 2 x daily - 7 x weekly - 2 sets - 10 reps - Shoulder Flexion Overhead with Dowel  - 2 x daily - 7 x weekly - 2 sets - 10 reps - Shoulder Scaption AAROM with Dowel  - 2 x daily - 7 x weekly - 2 sets - 10 reps - Bicep Stretch at Table  - 2 x daily - 7 x weekly - 1 sets - 3 reps - 30 hold - Standing Shoulder Flexion Wall Walk  - 2 x  daily - 7 x weekly - 2 sets - 10 reps    PATIENT EDUCATION: Education details: surgical protocol, signs of infection, cryotherapy, edema management, joint protection, diagnosis, prognosis, anatomy, exercise progression, DOMS expectations, muscle firing,  envelope of function, HEP, POC Person educated: Patient Education method: Explanation, Demonstration, Tactile cues, Verbal cues, and Handouts Education comprehension: verbalized understanding, returned demonstration, verbal cues required, tactile cues required, and needs further education     HOME EXERCISE PROGRAM:    Access Code: [email or web address] URL: [email or web address] Date: 03/21/2024 Prepared by: Dale Call      ASSESSMENT:   CLINICAL IMPRESSION:   Pt with good tolerance for closed chain stability and strengthening today. Trialled partial quadruped push ups without c/o discomfort. Fatigues with seated eccentric 90/90 ER. Non-painful popping in shoulder present with cable column adduction for OH position. No popping with extension to neutral from [facility] position. No c/o pain throughout session.        OBJECTIVE IMPAIRMENTS decreased activity tolerance, decreased ROM, decreased strength, increased muscle spasms, impaired UE functional use, improper body mechanics, postural dysfunction, and pain.    ACTIVITY LIMITATIONS cleaning, lifting, bending, carry, dressing, bathing, feeding, community activity, meal prep, laundry, and yard work.    PERSONAL FACTORS Time since onset of injury/illness/exacerbation is also affecting patient's functional outcome.     REHAB POTENTIAL: Good   CLINICAL DECISION MAKING: Stable/uncomplicated   EVALUATION COMPLEXITY: Low     GOALS:     SHORT TERM  GOALS: Target date: 05/02/2024        Pt will become independent with HEP in order to demonstrate synthesis of PT education.   Goal status: MEt   2.  Pt will be able to demonstrate full PROM  in order to demonstrate functional  improvement in UE for progression to next phase of protocol.     Goal status: MET   3.  Pt will report at least 2 pt reduction on NPRS scale for pain in order to demonstrate functional improvement with household activity, self care, and ADL.    Goal status: MET       LONG TERM GOALS: Target date: 06/19/2024       Pt  will become independent with final HEP in order to demonstrate synthesis of PT education.   Goal status: ongoing   2.  Pt will be able to reach [facility] and carry/hold >15lbs in order to demonstrate functional improvement in R UE strength for return to household duties and ADL.SABRA    Goal status: ongoing   3.  Pt will be able to demonstrate full OH AROM of the R shoulder in order to demonstrate functional improvement in UE function for self-care and house hold duties.    Goal status: ongoing   4.  Pt will test to within 80% HHD of L UE in order to demonstrate functional  UE strength improvement and limb symmetry.     Goal status: ongoing      PLAN: PT FREQUENCY: 1-2x/week   PT DURATION: 12 weeks   PLANNED INTERVENTIONS: Therapeutic exercises, Therapeutic activity, Neuromuscular re-education, Patient/Family education, Joint mobilization, Dry Needling, Spinal mobilization, Cryotherapy, Moist heat, Taping, and Manual therapy, Re-evaluation   PLAN FOR NEXT SESSION:   progressive OH and general R UE strength, OH scap stability and strength    Asberry BRAVO Rolan Wrightsman, PTA 05/10/2024, 12:28 PM

## 2024-05-14 ENCOUNTER — Encounter (HOSPITAL_BASED_OUTPATIENT_CLINIC_OR_DEPARTMENT_OTHER): Admitting: Physical Therapy

## 2024-05-17 ENCOUNTER — Encounter (HOSPITAL_BASED_OUTPATIENT_CLINIC_OR_DEPARTMENT_OTHER): Payer: Self-pay

## 2024-05-17 ENCOUNTER — Ambulatory Visit (HOSPITAL_BASED_OUTPATIENT_CLINIC_OR_DEPARTMENT_OTHER)

## 2024-05-17 DIAGNOSIS — M25611 Stiffness of right shoulder, not elsewhere classified: Secondary | ICD-10-CM

## 2024-05-17 DIAGNOSIS — M25511 Pain in right shoulder: Secondary | ICD-10-CM

## 2024-05-17 DIAGNOSIS — M6281 Muscle weakness (generalized): Secondary | ICD-10-CM

## 2024-05-17 NOTE — Therapy (Signed)
 OUTPATIENT PHYSICAL THERAPY UPPER EXTREMITY TREATMENT   Patient Name: Donald Schwartz MRN: [id number] DOB:12-26-1965, 58 y.o., male Today's Date: 05/17/2024  END OF SESSION:  PT End of Session - 05/17/24 0921     Visit Number 12    Number of Visits 21    Date for PT Re-Evaluation 06/19/24    Authorization Type [place]    PT Start Time [phone]    PT Stop Time 1019    PT Time Calculation (min) 42 min    Activity Tolerance Patient tolerated treatment well    Behavior During Therapy [facility] for tasks assessed/performed                 Past Medical History:  Diagnosis Date   Allergy    Arthritis    Cellulitis    Elevated hemoglobin A1c    Elevated LDL cholesterol level    Hx of colonic polyp 11/20/2014   Hyperlipidemia    Hypertension    Hypogonadism male    Neuromuscular disorder (HCC)    Trigeminar neuralgia   Sleep apnea    wears CPAP   Vitamin D  deficiency    Past Surgical History:  Procedure Laterality Date   c5-6 fusion     2012   COLONOSCOPY  01/30/2018   Dr.Gessner   HIP FRACTURE SURGERY Right 1986   avulsion/chip fracture ?stress fracture   POLYPECTOMY     Patient Active Problem List   Diagnosis Date Noted   Fatty liver 11/26/2020   Morbid obesity (HCC) - BMI 30+ with OSA 02/03/2016   Medication management 01/10/2015   Trigeminal neuralgia of right side of face 01/10/2015   Hx of colonic polyp + FHx CRCA 11/20/2014   CKD (chronic kidney disease) stage 2, GFR 60-89 ml/min 10/11/2014   OSA on CPAP 01/07/2014   Vitamin D  deficiency 01/06/2014   Hypertension    Hyperlipidemia    Other abnormal glucose (prediabetes)     PCP:   Chrystal Lamarr RAMAN, MD    REFERRING PROVIDER: Canda Franky Jurist, MD   REFERRING DIAG:  [phone] (ICD-10-CM) - Complete rotator cuff tear or rupture of right shoulder, not specified as traumatic      THERAPY DIAG:  Decreased right shoulder range of motion  Muscle weakness (generalized)  Pain in joint of right  shoulder  Rationale for Evaluation and Treatment: Rehabilitation  ONSET DATE: 02/03/2024 DOS  s/p Right shoulder  arthroscopic supraspinatus and subscapularis tendon repair  (per office visit note)   SUBJECTIVE:  SUBJECTIVE STATEMENT:  Pt will return to work on 9/2 without restrictions. No c/o in shoulder at entry. Has been going to gym 2-3x/week.     Eval: Pt states its was a moderate sized tear. MD repaired rotator cuff, debrided the shoulder for the bursitis and does not report biceps tendon procedure. Dr. Canda is Careers adviser. Pt just recently came out of the sling this week after virtual visit with PA. He has been doing pendulums, table slides, and wall walks- 4x/day for 10 reps. Pt is sleeping in bed now and is back sleeper.  Incisions are closed and well healed.   Hand dominance: Right  PERTINENT HISTORY: HLD, HTN, recurrent cellulitis, CKD  PAIN:  Are you having pain? no: NPRS scale: 0/10 at rest; worst 6/10 Pain location: posterior shoulder, biceps Pain description: aching/ sore/[place] Aggravating factors: movement Relieving factors: rest, meds, icing  PRECAUTIONS: Shoulder  RED FLAGS: None   WEIGHT BEARING RESTRICTIONS: Yes WBAT now at 6 wks post op  FALLS:  Has patient fallen in last 6 months? 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  DIAGNOSTIC FINDINGS:  N/A in chart; MRI performed at [facility]  PATIENT SURVEYS :  UEFS  Extreme difficulty/unable (0), Quite  a bit of difficulty (1), Moderate difficulty (2), Little difficulty (3), No difficulty (4) Survey date:  eval 8/14  Any of your usual work, household or school activities 1 3  2. Your usual hobbies, recreational/sport activities 1 3   3. Lifting a bag of groceries to waist level 2 3   4. Lifting a bag of groceries above your head 3 3  5. Grooming your hair 2 4  6. Pushing up on your hands (I.e. from bathtub or chair) 1 3  7. Preparing food (I.e. peeling/cutting) 2 4  8. Driving  0 4  9. Vacuuming, sweeping, or raking 2 3  10. Dressing  3 4  11. Doing up buttons 4 4  12. Using tools/appliances 2 3  13. Opening doors 3 4  14. Cleaning  3 3  15. Tying or lacing shoes 4 4  16. Sleeping  3 4  17. Laundering clothes (I.e. washing, ironing, folding) 4 4  18. Opening a jar 1 3  19. Throwing a ball 1 2  20. Carrying a small suitcase with your affected limb.  2 3  Score total:  44  68/80     COGNITION: Overall cognitive status: Within functional limits for tasks assessed     SENSATION: WFL  POSTURE: Mild kyphosis, rounded shoulders, guarded R shoulder with shrugging position  UPPER EXTREMITY ROM:   Active ROM Right eval Left eval  Shoulder flexion 150 155  Shoulder extension    Shoulder abduction 155 155  Shoulder adduction    Shoulder internal rotation To belly To belly  Shoulder external rotation 60 60  Elbow flexion    Elbow extension    Wrist flexion    Wrist extension    Wrist ulnar deviation    Wrist radial deviation    Wrist pronation    Wrist supination    (Blank rows = not tested)  UPPER EXTREMITY MMT:  MMT In lbs Right 8/14  Left 8/14  Shoulder flexion 28.0 44.6  Shoulder extension 40.1 57.1  Shoulder abduction 26.7 45.8  Shoulder adduction    Shoulder internal rotation 26.0 31.9  Shoulder external rotation 14.9 22.1  Elbow flexion 37.3 64.0  Elbow extension    Wrist flexion    Wrist extension    Wrist ulnar deviation    Wrist radial deviation     Wrist pronation    Wrist supination    (Blank rows = not tested)    JOINT MOBILITY TESTING:  R GHJ stiffness as expected in inf and post post op  PALPATION:  No TTP along deltoid or biceps Hypertonicity of biceps belly                                                                                                                               TREATMENT DATE:   8/28  UBE retro and fwd 1 min each; 4 min total L4  Quadruped protraction 2x15 Quadruped push ups 2x10 Bicep curl to OH press 5# 3x10 Pec stretch 120 deg 30s 3x  Cable column: Adduction from OH 10# 2x10 Extension to neutral from [facility] 2x10# Fwd press out 15# 2x10 Triceps press down machine 70# 3x10 Row machine-70# 3x10 Lat pull down machine LF- 55lbs 3x20 Shoulder press machine- 10# 3x10 (neutral grip)  8/21  UBE retro and fwd 1 min each; 4 min total L4  Quadruped protraction 2x15 Table plank taps 3x12 Quadruped alt UE lift 3x10 Quadruped push ups 2x10 Table 90/90 eccentrics ER 4lbs 3x10 (Seated) Bicep curl to OH press 4# 3x10 Pec stretch 120 deg 30s 3x  Cable column: Adduction from [facility] 5#x10, 10#x10 Extension to neutral from [facility] 2x10# Fwd press out 15# 2x10     8/14  UBE retro and fwd 1 min each; 6 min total  Quadruped protraction 15x Quadruped alt LE lift 3x10 Table 90/90 eccentrics ER 4lbs 3x8 Arnold press 2lbs 3x8 Table side plank 3x5 2s holds Pec stretch 120 deg 30s 3x     PATIENT EDUCATION: Education details: surgical protocol, signs of infection, cryotherapy, edema management, joint protection, diagnosis, prognosis, anatomy, exercise progression, DOMS expectations, muscle firing,  envelope of function, HEP, POC Person educated: Patient Education method: Explanation, Demonstration, Tactile cues, Verbal cues, and Handouts Education comprehension: verbalized understanding, returned demonstration, verbal cues required, tactile cues required, and needs further education     HOME EXERCISE  PROGRAM:    Access Code: [email or web address] URL: [email or web address] Date: 03/21/2024 Prepared by: Dale Call      ASSESSMENT:   CLINICAL IMPRESSION:   Pt reports minimal popping in shoulder today with exercises, an improvement from last session. Progressed machine strengthening today with intent to improve functional reach, lifting, pushing, and pulling as pt returns to work next week. He denied pain with progressions. Appropriate fatigue by 3rd set of each. Most challenged by shoulder press machine. Cues provided throughout session for scapular engagement and stability. Pt will benefit from additional  PT to improve functional capacity.        OBJECTIVE IMPAIRMENTS decreased activity tolerance, decreased ROM, decreased strength, increased muscle spasms, impaired UE functional use, improper body mechanics, postural dysfunction, and pain.    ACTIVITY LIMITATIONS cleaning, lifting, bending, carry, dressing, bathing, feeding, community activity, meal prep, laundry, and yard work.    PERSONAL FACTORS Time since onset of injury/illness/exacerbation is also affecting patient's functional outcome.     REHAB POTENTIAL: Good   CLINICAL DECISION MAKING: Stable/uncomplicated   EVALUATION COMPLEXITY: Low     GOALS:     SHORT TERM GOALS: Target date: 05/02/2024        Pt will become independent with HEP in order to demonstrate synthesis of PT education.   Goal status: MEt   2.  Pt will be able to demonstrate full PROM  in order to demonstrate functional improvement in UE for progression to next phase of protocol.     Goal status: MET   3.  Pt will report at least 2 pt reduction on NPRS scale for pain in order to demonstrate functional improvement with household activity, self care, and ADL.    Goal status: MET       LONG TERM GOALS: Target date: 06/19/2024       Pt  will become independent with final HEP in order to demonstrate synthesis of PT education.   Goal  status: ongoing   2.  Pt will be able to reach [facility] and carry/hold >15lbs in order to demonstrate functional improvement in R UE strength for return to household duties and ADL.SABRA    Goal status: ongoing   3.  Pt will be able to demonstrate full OH AROM of the R shoulder in order to demonstrate functional improvement in UE function for self-care and house hold duties.    Goal status: ongoing   4.  Pt will test to within 80% HHD of L UE in order to demonstrate functional  UE strength improvement and limb symmetry.     Goal status: ongoing      PLAN: PT FREQUENCY: 1-2x/week   PT DURATION: 12 weeks   PLANNED INTERVENTIONS: Therapeutic exercises, Therapeutic activity, Neuromuscular re-education, Patient/Family education, Joint mobilization, Dry Needling, Spinal mobilization, Cryotherapy, Moist heat, Taping, and Manual therapy, Re-evaluation   PLAN FOR NEXT SESSION:   progressive OH and general R UE strength, OH scap stability and strength    Asberry BRAVO Khyri Hinzman, PTA 05/17/2024, 10:49 AM

## 2024-05-24 ENCOUNTER — Ambulatory Visit (HOSPITAL_BASED_OUTPATIENT_CLINIC_OR_DEPARTMENT_OTHER): Attending: Orthopedic Surgery

## 2024-05-24 ENCOUNTER — Encounter (HOSPITAL_BASED_OUTPATIENT_CLINIC_OR_DEPARTMENT_OTHER): Payer: Self-pay

## 2024-05-24 DIAGNOSIS — M25511 Pain in right shoulder: Secondary | ICD-10-CM | POA: Insufficient documentation

## 2024-05-24 DIAGNOSIS — M6281 Muscle weakness (generalized): Secondary | ICD-10-CM | POA: Insufficient documentation

## 2024-05-24 DIAGNOSIS — M25611 Stiffness of right shoulder, not elsewhere classified: Secondary | ICD-10-CM | POA: Insufficient documentation

## 2024-05-24 NOTE — Therapy (Signed)
 OUTPATIENT PHYSICAL THERAPY UPPER EXTREMITY TREATMENT   Patient Name: Donald Schwartz MRN: [id number] DOB:May 13, 1966, 58 y.o., male Today's Date: 05/24/2024  END OF SESSION:  PT End of Session - 05/24/24 0805     Visit Number 13    Number of Visits 21    Date for PT Re-Evaluation 06/19/24    Authorization Type [place]    PT Start Time 0802    PT Stop Time 0845    PT Time Calculation (min) 43 min    Activity Tolerance Patient tolerated treatment well    Behavior During Therapy [facility] for tasks assessed/performed                  Past Medical History:  Diagnosis Date   Allergy    Arthritis    Cellulitis    Elevated hemoglobin A1c    Elevated LDL cholesterol level    Hx of colonic polyp 11/20/2014   Hyperlipidemia    Hypertension    Hypogonadism male    Neuromuscular disorder (HCC)    Trigeminar neuralgia   Sleep apnea    wears CPAP   Vitamin D  deficiency    Past Surgical History:  Procedure Laterality Date   c5-6 fusion     2012   COLONOSCOPY  01/30/2018   Dr.Gessner   HIP FRACTURE SURGERY Right 1986   avulsion/chip fracture ?stress fracture   POLYPECTOMY     Patient Active Problem List   Diagnosis Date Noted   Fatty liver 11/26/2020   Morbid obesity (HCC) - BMI 30+ with OSA 02/03/2016   Medication management 01/10/2015   Trigeminal neuralgia of right side of face 01/10/2015   Hx of colonic polyp + FHx CRCA 11/20/2014   CKD (chronic kidney disease) stage 2, GFR 60-89 ml/min 10/11/2014   OSA on CPAP 01/07/2014   Vitamin D  deficiency 01/06/2014   Hypertension    Hyperlipidemia    Other abnormal glucose (prediabetes)     PCP:   Chrystal Lamarr RAMAN, MD    REFERRING PROVIDER: Canda Franky Jurist, MD   REFERRING DIAG:  [phone] (ICD-10-CM) - Complete rotator cuff tear or rupture of right shoulder, not specified as traumatic      THERAPY DIAG:  Decreased right shoulder range of motion  Muscle weakness (generalized)  Pain in joint of right  shoulder  Rationale for Evaluation and Treatment: Rehabilitation  ONSET DATE: 02/03/2024 DOS  s/p Right shoulder  arthroscopic supraspinatus and subscapularis tendon repair  (per office visit note)   SUBJECTIVE:  SUBJECTIVE STATEMENT:  Pt reports he returned to work this week. No complaints in shoulder since returning. He denies pain at entry.     Eval: Pt states its was a moderate sized tear. MD repaired rotator cuff, debrided the shoulder for the bursitis and does not report biceps tendon procedure. Dr. Canda is Careers adviser. Pt just recently came out of the sling this week after virtual visit with PA. He has been doing pendulums, table slides, and wall walks- 4x/day for 10 reps. Pt is sleeping in bed now and is back sleeper.  Incisions are closed and well healed.   Hand dominance: Right  PERTINENT HISTORY: HLD, HTN, recurrent cellulitis, CKD  PAIN:  Are you having pain? no: NPRS scale: 0/10 at rest; worst 6/10 Pain location: posterior shoulder, biceps Pain description: aching/ sore/Lopezgarcia Aggravating factors: movement Relieving factors: rest, meds, icing  PRECAUTIONS: Shoulder  RED FLAGS: None   WEIGHT BEARING RESTRICTIONS: Yes WBAT now at 6 wks post op  FALLS:  Has patient fallen in last 6 months? 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  DIAGNOSTIC FINDINGS:  N/A in chart; MRI performed at [facility]  PATIENT SURVEYS :  UEFS  Extreme difficulty/unable (0), Quite a  bit of difficulty (1), Moderate difficulty (2), Little difficulty (3), No difficulty (4) Survey date:  eval 8/14  Any of your usual work, household or school activities 1 3  2. Your usual hobbies, recreational/sport activities 1 3   3. Lifting a bag of groceries to waist level 2 3   4. Lifting a bag of groceries above your head 3 3  5. Grooming your hair 2 4  6. Pushing up on your hands (I.e. from bathtub or chair) 1 3  7. Preparing food (I.e. peeling/cutting) 2 4  8. Driving  0 4  9. Vacuuming, sweeping, or raking 2 3  10. Dressing  3 4  11. Doing up buttons 4 4  12. Using tools/appliances 2 3  13. Opening doors 3 4  14. Cleaning  3 3  15. Tying or lacing shoes 4 4  16. Sleeping  3 4  17. Laundering clothes (I.e. washing, ironing, folding) 4 4  18. Opening a jar 1 3  19. Throwing a ball 1 2  20. Carrying a small suitcase with your affected limb.  2 3  Score total:  44  68/80     COGNITION: Overall cognitive status: Within functional limits for tasks assessed     SENSATION: WFL  POSTURE: Mild kyphosis, rounded shoulders, guarded R shoulder with shrugging position  UPPER EXTREMITY ROM:   Active ROM Right eval Left eval  Shoulder flexion 150 155  Shoulder extension    Shoulder abduction 155 155  Shoulder adduction    Shoulder internal rotation To belly To belly  Shoulder external rotation 60 60  Elbow flexion    Elbow extension    Wrist flexion    Wrist extension    Wrist ulnar deviation    Wrist radial deviation    Wrist pronation    Wrist supination    (Blank rows = not tested)  UPPER EXTREMITY MMT:  MMT In lbs Right 8/14  Left  8/14  Shoulder flexion 28.0 44.6  Shoulder extension 40.1 57.1  Shoulder abduction 26.7 45.8  Shoulder adduction    Shoulder internal rotation 26.0 31.9  Shoulder external rotation 14.9 22.1  Elbow flexion 37.3 64.0  Elbow extension    Wrist flexion    Wrist extension    Wrist ulnar deviation    Wrist radial deviation     Wrist pronation    Wrist supination    (Blank rows = not tested)    JOINT MOBILITY TESTING:  R GHJ stiffness as expected in inf and post post op  PALPATION:  No TTP along deltoid or biceps Hypertonicity of biceps belly                                                                                                                               TREATMENT DATE:   9/4 UBE retro and fwd 1 min each; 4 min total L4  Quadruped protraction 2x15 Quadruped push ups 2x10 Bicep curl to OH press 6# 2x10 Pec stretch 120 deg 30s 3x  PNF D2 (1plate upstairs cable) 2x10 Bench press with dumbbells 10lb 2x10 (bothered low back) TRX row 2x10 Farmer carry 13# KB bil x1 lap around track  Cable column: Adduction from OH 10# 2x10 Fwd press out 15# 2x10 Bent over row 25# 3x10    8/28  UBE retro and fwd 1 min each; 4 min total L4  Quadruped protraction 2x15 Quadruped push ups 2x10 Bicep curl to OH press 5# 3x10 Pec stretch 120 deg 30s 3x  Cable column: Adduction from OH 10# 2x10 Extension to neutral from [facility] 2x10# Fwd press out 15# 2x10 Triceps press down machine 70# 3x10 Row machine-70# 3x10 Lat pull down machine LF- 55lbs 3x20 Shoulder press machine- 10# 3x10 (neutral grip)  8/21  UBE retro and fwd 1 min each; 4 min total L4  Quadruped protraction 2x15 Table plank taps 3x12 Quadruped alt UE lift 3x10 Quadruped push ups 2x10 Table 90/90 eccentrics ER 4lbs 3x10 (Seated) Bicep curl to OH press 4# 3x10 Pec stretch 120 deg 30s 3x  Cable column: Adduction from [facility] 5#x10, 10#x10 Extension to neutral from [facility] 2x10# Fwd press out 15# 2x10     8/14  UBE retro and fwd 1 min each; 6 min total  Quadruped protraction 15x Quadruped alt LE lift 3x10 Table 90/90 eccentrics ER 4lbs 3x8 Arnold press 2lbs 3x8 Table side plank 3x5 2s holds Pec stretch 120 deg 30s 3x     PATIENT EDUCATION: Education details: surgical protocol, signs of infection, cryotherapy, edema management,  joint protection, diagnosis, prognosis, anatomy, exercise progression, DOMS expectations, muscle firing,  envelope of function, HEP, POC Person educated: Patient Education method: Explanation, Demonstration, Tactile cues, Verbal cues, and Handouts Education comprehension: verbalized understanding, returned demonstration, verbal cues required, tactile cues required, and needs further education     HOME EXERCISE PROGRAM:    Access Code: [email or web address] URL: [email or web address] Date: 03/21/2024 Prepared by:  Dale Call      ASSESSMENT:   CLINICAL IMPRESSION:   Continued to work on functional based strengtehning including lifting, pulling, and pushing movements. He was able to increase resistance with OH press to 6lb dumbbell with good tolerance. With supine bench press he had difficulty with transferring back to sitting and required modA from clinician. C/o back discomfort with this that subsided after standing. Added farmer carry to work on grip strength, scapular stability, and function. No c/o shoulder pain by end of session, but did have mild fatigue.        OBJECTIVE IMPAIRMENTS decreased activity tolerance, decreased ROM, decreased strength, increased muscle spasms, impaired UE functional use, improper body mechanics, postural dysfunction, and pain.    ACTIVITY LIMITATIONS cleaning, lifting, bending, carry, dressing, bathing, feeding, community activity, meal prep, laundry, and yard work.    PERSONAL FACTORS Time since onset of injury/illness/exacerbation is also affecting patient's functional outcome.     REHAB POTENTIAL: Good   CLINICAL DECISION MAKING: Stable/uncomplicated   EVALUATION COMPLEXITY: Low     GOALS:     SHORT TERM GOALS: Target date: 05/02/2024        Pt will become independent with HEP in order to demonstrate synthesis of PT education.   Goal status: MEt   2.  Pt will be able to demonstrate full PROM  in order to demonstrate functional  improvement in UE for progression to next phase of protocol.     Goal status: MET   3.  Pt will report at least 2 pt reduction on NPRS scale for pain in order to demonstrate functional improvement with household activity, self care, and ADL.    Goal status: MET       LONG TERM GOALS: Target date: 06/19/2024       Pt  will become independent with final HEP in order to demonstrate synthesis of PT education.   Goal status: ongoing   2.  Pt will be able to reach [facility] and carry/hold >15lbs in order to demonstrate functional improvement in R UE strength for return to household duties and ADL.[email or web address]    Goal status: ongoing   3.  Pt will be able to demonstrate full OH AROM of the R shoulder in order to demonstrate functional improvement in UE function for self-care and house hold duties.    Goal status: ongoing   4.  Pt will test to within 80% HHD of L UE in order to demonstrate functional  UE strength improvement and limb symmetry.     Goal status: ongoing      PLAN: PT FREQUENCY: 1-2x/week   PT DURATION: 12 weeks   PLANNED INTERVENTIONS: Therapeutic exercises, Therapeutic activity, Neuromuscular re-education, Patient/Family education, Joint mobilization, Dry Needling, Spinal mobilization, Cryotherapy, Moist heat, Taping, and Manual therapy, Re-evaluation   PLAN FOR NEXT SESSION:   progressive OH and general R UE strength, OH scap stability and strength    Asberry BRAVO [place]ing Sigmon, PTA 05/24/2024, 9:33 AM

## 2024-06-01 ENCOUNTER — Encounter (HOSPITAL_BASED_OUTPATIENT_CLINIC_OR_DEPARTMENT_OTHER): Payer: Self-pay

## 2024-06-01 ENCOUNTER — Ambulatory Visit (HOSPITAL_BASED_OUTPATIENT_CLINIC_OR_DEPARTMENT_OTHER)

## 2024-06-01 DIAGNOSIS — M6281 Muscle weakness (generalized): Secondary | ICD-10-CM

## 2024-06-01 DIAGNOSIS — M25611 Stiffness of right shoulder, not elsewhere classified: Secondary | ICD-10-CM | POA: Diagnosis not present

## 2024-06-01 DIAGNOSIS — M25511 Pain in right shoulder: Secondary | ICD-10-CM

## 2024-06-01 NOTE — Therapy (Signed)
 OUTPATIENT PHYSICAL THERAPY UPPER EXTREMITY TREATMENT   Patient Name: Donald Schwartz MRN: [id number] DOB:27-May-1966, 58 y.o., male Today's Date: 06/01/2024  END OF SESSION:  PT End of Session - 06/01/24 1512     Visit Number 14    Number of Visits 21    Date for PT Re-Evaluation 06/19/24    Authorization Type [place]    PT Start Time 1513    PT Stop Time 1600    PT Time Calculation (min) 47 min    Activity Tolerance Patient tolerated treatment well    Behavior During Therapy [facility] for tasks assessed/performed                   Past Medical History:  Diagnosis Date   Allergy    Arthritis    Cellulitis    Elevated hemoglobin A1c    Elevated LDL cholesterol level    Hx of colonic polyp 11/20/2014   Hyperlipidemia    Hypertension    Hypogonadism male    Neuromuscular disorder (HCC)    Trigeminar neuralgia   Sleep apnea    wears CPAP   Vitamin D  deficiency    Past Surgical History:  Procedure Laterality Date   c5-6 fusion     2012   COLONOSCOPY  01/30/2018   Dr.Gessner   HIP FRACTURE SURGERY Right 1986   avulsion/chip fracture ?stress fracture   POLYPECTOMY     Patient Active Problem List   Diagnosis Date Noted   Fatty liver 11/26/2020   Morbid obesity (HCC) - BMI 30+ with OSA 02/03/2016   Medication management 01/10/2015   Trigeminal neuralgia of right side of face 01/10/2015   Hx of colonic polyp + FHx CRCA 11/20/2014   CKD (chronic kidney disease) stage 2, GFR 60-89 ml/min 10/11/2014   OSA on CPAP 01/07/2014   Vitamin D  deficiency 01/06/2014   Hypertension    Hyperlipidemia    Other abnormal glucose (prediabetes)     PCP:   Chrystal Lamarr RAMAN, MD    REFERRING PROVIDER: Canda Franky Jurist, MD   REFERRING DIAG:  [phone] (ICD-10-CM) - Complete rotator cuff tear or rupture of right shoulder, not specified as traumatic      THERAPY DIAG:  Decreased right shoulder range of motion  Muscle weakness (generalized)  Pain in joint of  right shoulder  Rationale for Evaluation and Treatment: Rehabilitation  ONSET DATE: 02/03/2024 DOS  s/p Right shoulder  arthroscopic supraspinatus and subscapularis tendon repair  (per office visit note)   SUBJECTIVE:  SUBJECTIVE STATEMENT:  Pt reports minimal to no pain at entry. Getting used to work again. Feels better this week.     Eval: Pt states its was a moderate sized tear. MD repaired rotator cuff, debrided the shoulder for the bursitis and does not report biceps tendon procedure. Dr. Canda is Careers adviser. Pt just recently came out of the sling this week after virtual visit with PA. He has been doing pendulums, table slides, and wall walks- 4x/day for 10 reps. Pt is sleeping in bed now and is back sleeper.  Incisions are closed and well healed.   Hand dominance: Right  PERTINENT HISTORY: HLD, HTN, recurrent cellulitis, CKD  PAIN:  Are you having pain? no: NPRS scale: 0/10 at rest; worst 6/10 Pain location: posterior shoulder, biceps Pain description: aching/ sore/Marohl Aggravating factors: movement Relieving factors: rest, meds, icing  PRECAUTIONS: Shoulder  RED FLAGS: None   WEIGHT BEARING RESTRICTIONS: Yes WBAT now at 6 wks post op  FALLS:  Has patient fallen in last 6 months? 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  DIAGNOSTIC FINDINGS:  N/A in chart; MRI performed at [facility]  PATIENT SURVEYS :  UEFS  Extreme difficulty/unable (0), Quite a bit of  difficulty (1), Moderate difficulty (2), Little difficulty (3), No difficulty (4) Survey date:  eval 8/14  Any of your usual work, household or school activities 1 3  2. Your usual hobbies, recreational/sport activities 1 3   3. Lifting a bag of groceries to waist level 2 3   4. Lifting a bag of groceries above your head 3 3  5. Grooming your hair 2 4  6. Pushing up on your hands (I.e. from bathtub or chair) 1 3  7. Preparing food (I.e. peeling/cutting) 2 4  8. Driving  0 4  9. Vacuuming, sweeping, or raking 2 3  10. Dressing  3 4  11. Doing up buttons 4 4  12. Using tools/appliances 2 3  13. Opening doors 3 4  14. Cleaning  3 3  15. Tying or lacing shoes 4 4  16. Sleeping  3 4  17. Laundering clothes (I.e. washing, ironing, folding) 4 4  18. Opening a jar 1 3  19. Throwing a ball 1 2  20. Carrying a small suitcase with your affected limb.  2 3  Score total:  44  68/80     COGNITION: Overall cognitive status: Within functional limits for tasks assessed     SENSATION: WFL  POSTURE: Mild kyphosis, rounded shoulders, guarded R shoulder with shrugging position  UPPER EXTREMITY ROM:   Active ROM Right eval Left eval  Shoulder flexion 150 155  Shoulder extension    Shoulder abduction 155 155  Shoulder adduction    Shoulder internal rotation To belly To belly  Shoulder external rotation 60 60  Elbow flexion    Elbow extension    Wrist flexion    Wrist extension    Wrist ulnar deviation    Wrist radial deviation    Wrist pronation    Wrist supination    (Blank rows = not tested)  UPPER EXTREMITY MMT:  MMT In lbs Right 8/14  Left 8/14  Shoulder flexion 28.0 44.6  Shoulder extension 40.1 57.1  Shoulder abduction 26.7 45.8  Shoulder adduction    Shoulder internal rotation 26.0 31.9  Shoulder external rotation 14.9 22.1  Elbow flexion 37.3 64.0  Elbow extension    Wrist flexion    Wrist extension    Wrist ulnar deviation    Wrist radial deviation    Wrist  pronation    Wrist supination    (Blank rows = not tested)    JOINT MOBILITY TESTING:  R GHJ stiffness as expected in inf and post post op  PALPATION:  No TTP along deltoid or biceps Hypertonicity of biceps belly                                                                                                                               TREATMENT DATE:   9/12 UBE retro and fwd 1 min each; 4 min total L4  Quadruped protraction 2x15 Quadruped push ups 2x10 TRX row 2x10 TRX push ups 2x10 Farmer carry 13# KB bil x2 laps around track(break in between) Inclined bench press 10# x10, 5# 2x10 OH press 10# (trialled, but too heavy) 5# 2x10    Cable column: Adduction from OH 10# 2x10 Fwd press out 15# 2x10 Bent over row 30# 2x10 Standing lat pulldown 30# x10, 50# x10  Upstairs: PNF flexion 2.5pl x10 PNF extension 9pl x10    9/4 UBE retro and fwd 1 min each; 4 min total L4  Quadruped protraction 2x15 Quadruped push ups 2x10 Bicep curl to OH press 6# 2x10 Pec stretch 120 deg 30s 3x  PNF D2 (1plate upstairs cable) 2x10 Bench press with dumbbells 10lb 2x10 (bothered low back) TRX row 2x10 Farmer carry 13# KB bil x1 lap around track  Cable column: Adduction from OH 10# 2x10 Fwd press out 20# 2x10 Bent over row 25# 3x10    8/28  UBE retro and fwd 1 min each; 4 min total L4  Quadruped protraction 2x15 Quadruped push ups 2x10 Bicep curl to OH press 5# 3x10 Pec stretch 120 deg 30s 3x  Cable column: Adduction from OH 10# 2x10 Extension to neutral from [facility] 2x10# Fwd press out 15# 2x10 Triceps press down machine 70# 3x10 Row machine-70# 3x10 Lat pull down machine LF- 55lbs 3x20 Shoulder press machine- 10# 3x10 (neutral grip)  8/21  UBE retro and fwd 1 min each; 4 min total L4  Quadruped protraction 2x15 Table plank taps 3x12 Quadruped alt UE lift 3x10 Quadruped push ups 2x10 Table 90/90 eccentrics ER 4lbs 3x10 (Seated) Bicep curl to OH press 4# 3x10 Pec  stretch 120 deg 30s 3x  Cable column: Adduction from [facility] 5#x10, 10#x10 Extension to neutral from [facility] 2x10# Fwd press out 15# 2x10     8/14  UBE retro and fwd 1 min each; 6 min total  Quadruped protraction 15x Quadruped alt LE lift 3x10 Table 90/90 eccentrics ER 4lbs 3x8 Arnold press 2lbs 3x8 Table  side plank 3x5 2s holds Pec stretch 120 deg 30s 3x     PATIENT EDUCATION: Education details: surgical protocol, signs of infection, cryotherapy, edema management, joint protection, diagnosis, prognosis, anatomy, exercise progression, DOMS expectations, muscle firing,  envelope of function, HEP, POC Person educated: Patient Education method: Explanation, Demonstration, Tactile cues, Verbal cues, and Handouts Education comprehension: verbalized understanding, returned demonstration, verbal cues required, tactile cues required, and needs further education     HOME EXERCISE PROGRAM:    Access Code: [email or web address] URL: [email or web address] Date: 03/21/2024 Prepared by: Dale Call      ASSESSMENT:   CLINICAL IMPRESSION:   Continued to work on functional based strengtehning including lifting, pulling, and pushing movements. Increased resistance with fwd press outs at cable column to 20lbs with good tolerance and no c/o pain. Added TRX push ups which challenged his NMC and strength. Pt with improved performance of farmer carry today compared to previous session. Added dumbbell overhead press and inclined bench press which were fatiguing, though well tolerated. Pt will be ready for d/c to independent program next session dependent on objective testing.        OBJECTIVE IMPAIRMENTS decreased activity tolerance, decreased ROM, decreased strength, increased muscle spasms, impaired UE functional use, improper body mechanics, postural dysfunction, and pain.    ACTIVITY LIMITATIONS cleaning, lifting, bending, carry, dressing, bathing, feeding, community activity, meal prep,  laundry, and yard work.    PERSONAL FACTORS Time since onset of injury/illness/exacerbation is also affecting patient's functional outcome.     REHAB POTENTIAL: Good   CLINICAL DECISION MAKING: Stable/uncomplicated   EVALUATION COMPLEXITY: Low     GOALS:     SHORT TERM GOALS: Target date: 05/02/2024        Pt will become independent with HEP in order to demonstrate synthesis of PT education.   Goal status: MEt   2.  Pt will be able to demonstrate full PROM  in order to demonstrate functional improvement in UE for progression to next phase of protocol.     Goal status: MET   3.  Pt will report at least 2 pt reduction on NPRS scale for pain in order to demonstrate functional improvement with household activity, self care, and ADL.    Goal status: MET       LONG TERM GOALS: Target date: 06/19/2024       Pt  will become independent with final HEP in order to demonstrate synthesis of PT education.   Goal status: ongoing   2.  Pt will be able to reach [facility] and carry/hold >15lbs in order to demonstrate functional improvement in R UE strength for return to household duties and ADL.[email or web address]    Goal status: ongoing   3.  Pt will be able to demonstrate full OH AROM of the R shoulder in order to demonstrate functional improvement in UE function for self-care and house hold duties.    Goal status: ongoing   4.  Pt will test to within 80% HHD of L UE in order to demonstrate functional  UE strength improvement and limb symmetry.     Goal status: ongoing      PLAN: PT FREQUENCY: 1-2x/week   PT DURATION: 12 weeks   PLANNED INTERVENTIONS: Therapeutic exercises, Therapeutic activity, Neuromuscular re-education, Patient/Family education, Joint mobilization, Dry Needling, Spinal mobilization, Cryotherapy, Moist heat, Taping, and Manual therapy, Re-evaluation   PLAN FOR NEXT SESSION:   progressive OH and general R UE strength, OH scap stability and strength    Graciela Plato E Witt Plitt,  PTA 06/01/2024, 4:58 PM

## 2024-06-06 ENCOUNTER — Encounter (HOSPITAL_BASED_OUTPATIENT_CLINIC_OR_DEPARTMENT_OTHER): Payer: Self-pay | Admitting: Physical Therapy

## 2024-06-06 ENCOUNTER — Ambulatory Visit (HOSPITAL_BASED_OUTPATIENT_CLINIC_OR_DEPARTMENT_OTHER): Admitting: Physical Therapy

## 2024-06-06 DIAGNOSIS — M25511 Pain in right shoulder: Secondary | ICD-10-CM

## 2024-06-06 DIAGNOSIS — M25611 Stiffness of right shoulder, not elsewhere classified: Secondary | ICD-10-CM

## 2024-06-06 DIAGNOSIS — M6281 Muscle weakness (generalized): Secondary | ICD-10-CM

## 2024-06-06 NOTE — Therapy (Signed)
 OUTPATIENT PHYSICAL THERAPY UPPER EXTREMITY TREATMENT   PHYSICAL THERAPY DISCHARGE SUMMARY  Visits from Start of Care: 16 Plan: Patient agrees to discharge.  Patient goals were mostly met. Patient is being discharged due to requested DC      Patient Name: Donald Schwartz MRN: [id number] DOB:Jul 09, 1966, 58 y.o., male Today's Date: 06/06/2024  END OF SESSION:  PT End of Session - 06/06/24 1551     Visit Number 16    Number of Visits 21    Date for PT Re-Evaluation 06/19/24    Authorization Type [place]    PT Start Time 1530    PT Stop Time 1548    PT Time Calculation (min) 18 min    Activity Tolerance Patient tolerated treatment well    Behavior During Therapy [facility] for tasks assessed/performed                    Past Medical History:  Diagnosis Date   Allergy    Arthritis    Cellulitis    Elevated hemoglobin A1c    Elevated LDL cholesterol level    Hx of colonic polyp 11/20/2014   Hyperlipidemia    Hypertension    Hypogonadism male    Neuromuscular disorder (HCC)    Trigeminar neuralgia   Sleep apnea    wears CPAP   Vitamin D  deficiency    Past Surgical History:  Procedure Laterality Date   c5-6 fusion     2012   COLONOSCOPY  01/30/2018   Dr.Gessner   HIP FRACTURE SURGERY Right 1986   avulsion/chip fracture ?stress fracture   POLYPECTOMY     Patient Active Problem List   Diagnosis Date Noted   Fatty liver 11/26/2020   Morbid obesity (HCC) - BMI 30+ with OSA 02/03/2016   Medication management 01/10/2015   Trigeminal neuralgia of right side of face 01/10/2015   Hx of colonic polyp + FHx CRCA 11/20/2014   CKD (chronic kidney disease) stage 2, GFR 60-89 ml/min 10/11/2014   OSA on CPAP 01/07/2014   Vitamin D  deficiency 01/06/2014   Hypertension    Hyperlipidemia    Other abnormal glucose (prediabetes)     PCP:   Chrystal Lamarr RAMAN, MD    REFERRING PROVIDER: Canda Franky Jurist, MD   REFERRING DIAG:  [phone] (ICD-10-CM) - Complete  rotator cuff tear or rupture of right shoulder, not specified as traumatic      THERAPY DIAG:  Decreased right shoulder range of motion  Muscle weakness (generalized)  Pain in joint of right shoulder  Rationale for Evaluation and Treatment: Rehabilitation  ONSET DATE: 02/03/2024 DOS  s/p Right shoulder  arthroscopic supraspinatus and subscapularis tendon repair  (per office visit note)   SUBJECTIVE:  SUBJECTIVE STATEMENT:  Pt reports he is back to work. He is able to do everything at work without problems. Occasional pain  but he is modifying activity as needed. Is able to lift as needed. Is continuing to exercise. Pt reports he is about 85%. Feels able to D/C today.     Eval: Pt states its was a moderate sized tear. MD repaired rotator cuff, debrided the shoulder for the bursitis and does not report biceps tendon procedure. Dr. Canda is Careers adviser. Pt just recently came out of the sling this week after virtual visit with PA. He has been doing pendulums, table slides, and wall walks- 4x/day for 10 reps. Pt is sleeping in bed now and is back sleeper.  Incisions are closed and well healed.   Hand dominance: Right  PERTINENT HISTORY: HLD, HTN, recurrent cellulitis, CKD  PAIN:  Are you having pain? no: NPRS scale: 0/10 at rest; worst 6/10 Pain location: posterior shoulder, biceps Pain description: aching/ sore/Brisky Aggravating factors: movement Relieving factors: rest, meds, icing  PRECAUTIONS: Shoulder  RED FLAGS: None   WEIGHT BEARING RESTRICTIONS: Yes WBAT now at 6 wks post op  FALLS:  Has patient fallen in last 6 months? 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  DIAGNOSTIC FINDINGS:  N/A in chart; MRI performed at [facility]  PATIENT SURVEYS :  UEFS  Extreme difficulty/unable (0), Quite a bit of difficulty (1), Moderate difficulty (2), Little difficulty (3), No difficulty (4) Survey date:  eval 8/14 9/17   Any of your usual work, household or school activities 1 3 4   2. Your usual hobbies, recreational/sport activities 1 3 3    3. Lifting a bag of groceries to waist level 2 3 4    4. Lifting a bag of groceries above your head 3 3 4   5. Grooming your hair 2 4 4   6. Pushing up on your hands (I.e. from bathtub or chair) 1 3 3   7. Preparing food (I.e. peeling/cutting) 2 4 4   8. Driving  0 4 4  9. Vacuuming, sweeping, or raking 2 3 4   10. Dressing  3 4 4   11. Doing up buttons 4 4 4   12. Using tools/appliances 2 3 4   13. Opening doors 3 4 4   14. Cleaning  3 3 4   15. Tying or lacing shoes 4 4 4   16. Sleeping  3 4 4   17. Laundering clothes (I.e. washing, ironing, folding) 4 4 4   18. Opening a jar 1 3 3   19. Throwing a ball 1 2 3   20. Carrying a small suitcase with your affected limb.  2 3 4   Score total:  44  68/80 76/80     COGNITION: Overall cognitive status: Within functional limits for tasks assessed     SENSATION: WFL  POSTURE: Mild kyphosis, rounded shoulders, guarded R shoulder with shrugging position  UPPER EXTREMITY ROM:   Active ROM Right eval R 9/17 Left eval L 9/17  Shoulder flexion 150 160 155 160  Shoulder extension      Shoulder abduction 155 160 155 160  Shoulder adduction      Shoulder internal rotation To belly  To belly To  belly  Shoulder external rotation 60  60 70  Elbow flexion      Elbow extension      Wrist flexion      Wrist extension      Wrist ulnar deviation      Wrist radial deviation      Wrist  pronation      Wrist supination      (Blank rows = not tested)  UPPER EXTREMITY MMT:  MMT In lbs Right 8/14 R 9/17  Left 8/14 R 9/17  Shoulder flexion 28.0  44.6   Shoulder extension 40.1  57.1   Shoulder abduction 26.7  45.8   Shoulder adduction      Shoulder internal rotation 26.0  31.9   Shoulder external rotation 14.9  22.1   Elbow flexion 37.3  64.0   Elbow extension      Wrist flexion      Wrist extension      Wrist ulnar deviation      Wrist radial deviation      Wrist pronation      Wrist supination      (Blank rows = not tested)                                                                                     TREATMENT DATE:   9/17  Edu of testing results, precautions moving forward, HEP modifications, exercise progression/regression in the gym setting  9/12 UBE retro and fwd 1 min each; 4 min total L4  Quadruped protraction 2x15 Quadruped push ups 2x10 TRX row 2x10 TRX push ups 2x10 Farmer carry 13# KB bil x2 laps around track(break in between) Inclined bench press 10# x10, 5# 2x10 OH press 10# (trialled, but too heavy) 5# 2x10    Cable column: Adduction from OH 10# 2x10 Fwd press out 15# 2x10 Bent over row 30# 2x10 Standing lat pulldown 30# x10, 50# x10  Upstairs: PNF flexion 2.5pl x10 PNF extension 9pl x10    9/4 UBE retro and fwd 1 min each; 4 min total L4  Quadruped protraction 2x15 Quadruped push ups 2x10 Bicep curl to OH press 6# 2x10 Pec stretch 120 deg 30s 3x  PNF D2 (1plate upstairs cable) 2x10 Bench press with dumbbells 10lb 2x10 (bothered low back) TRX row 2x10 Farmer carry 13# KB bil x1 lap around track  Cable column: Adduction from OH 10# 2x10 Fwd press out 20# 2x10 Bent over row 25# 3x10    8/28  UBE retro and fwd 1 min each; 4 min total L4  Quadruped protraction 2x15 Quadruped push ups 2x10 Bicep curl to OH press 5# 3x10 Pec stretch 120 deg 30s 3x  Cable column: Adduction from OH 10# 2x10 Extension to  neutral from [facility] 2x10# Fwd press out 15# 2x10 Triceps press down machine 70# 3x10 Row machine-70# 3x10 Lat pull down machine LF- 55lbs 3x20 Shoulder press machine- 10# 3x10 (neutral grip)  8/21  UBE retro and fwd 1 min each; 4 min total L4  Quadruped protraction 2x15 Table plank taps 3x12 Quadruped alt UE lift 3x10 Quadruped push ups 2x10 Table 90/90 eccentrics ER 4lbs 3x10 (Seated) Bicep curl to [facility]  press 4# 3x10 Pec stretch 120 deg 30s 3x  Cable column: Adduction from [facility] 5#x10, 10#x10 Extension to neutral from [facility] 2x10# Fwd press out 15# 2x10     8/14  UBE retro and fwd 1 min each; 6 min total  Quadruped protraction 15x Quadruped alt LE lift 3x10 Table 90/90 eccentrics ER 4lbs 3x8 Arnold press 2lbs 3x8 Table side plank 3x5 2s holds Pec stretch 120 deg 30s 3x     PATIENT EDUCATION: Education details: surgical protocol, signs of infection, cryotherapy, edema management, joint protection, diagnosis, prognosis, anatomy, exercise progression, DOMS expectations, muscle firing,  envelope of function, HEP, POC Person educated: Patient Education method: Explanation, Demonstration, Tactile cues, Verbal cues, and Handouts Education comprehension: verbalized understanding, returned demonstration, verbal cues required, tactile cues required, and needs further education     HOME EXERCISE PROGRAM:    Access Code: [email or web address] URL: [email or web address] Date: 03/21/2024 Prepared by: Dale Call      ASSESSMENT:   CLINICAL IMPRESSION:   Pt would like to D/C at today's session. Pt's subjective outcome measure and ROM measures have improved greatly and are essentially normal at this time. Pt would like to defer HHD measure. Pt advised on previous limb symmetry deficits and how to progress exercise moving forward. Pt to D/C to independent management. Pt gave verbal understanding to current safety precautions given remaining shoulder weakness/deficits.    OBJECTIVE  IMPAIRMENTS decreased activity tolerance, decreased ROM, decreased strength, increased muscle spasms, impaired UE functional use, improper body mechanics, postural dysfunction, and pain.    ACTIVITY LIMITATIONS cleaning, lifting, bending, carry, dressing, bathing, feeding, community activity, meal prep, laundry, and yard work.    PERSONAL FACTORS Time since onset of injury/illness/exacerbation is also affecting patient's functional outcome.     REHAB POTENTIAL: Good   CLINICAL DECISION MAKING: Stable/uncomplicated   EVALUATION COMPLEXITY: Low     GOALS:     SHORT TERM GOALS: Target date: 05/02/2024        Pt will become independent with HEP in order to demonstrate synthesis of PT education.   Goal status: MEt   2.  Pt will be able to demonstrate full PROM  in order to demonstrate functional improvement in UE for progression to next phase of protocol.     Goal status: MET   3.  Pt will report at least 2 pt reduction on NPRS scale for pain in order to demonstrate functional improvement with household activity, self care, and ADL.    Goal status: MET       LONG TERM GOALS: Target date: 06/19/2024       Pt  will become independent with final HEP in order to demonstrate synthesis of PT education.   Goal status: MET   2.  Pt will be able to reach [facility] and carry/hold >15lbs in order to demonstrate functional improvement in R UE strength for return to household duties and ADL.SABRA    Goal status: Not MET   3.  Pt will be able to demonstrate full OH AROM of the R shoulder in order to demonstrate functional improvement in UE function for self-care and house hold duties.    Goal status: MET   4.  Pt will test to within 80% HHD of L UE in order to demonstrate functional  UE strength improvement and limb symmetry.     Goal status: unable to test      PLAN: PT FREQUENCY: 1-2x/week   PT DURATION: 12 weeks   PLANNED INTERVENTIONS: Therapeutic exercises, Therapeutic activity,  Neuromuscular re-education, Patient/Family education, Joint mobilization, Dry Needling, Spinal mobilization, Cryotherapy, Moist heat, Taping, and Manual therapy, Re-evaluation   PLAN FOR NEXT SESSION:   progressive OH and general R UE strength, OH scap stability and strength    Dale Call, PT 06/06/2024, 3:53 PM

## 2024-06-12 ENCOUNTER — Encounter (HOSPITAL_BASED_OUTPATIENT_CLINIC_OR_DEPARTMENT_OTHER)

## 2024-07-24 ENCOUNTER — Encounter: Payer: 59 | Admitting: Internal Medicine

## 2025-04-11 ENCOUNTER — Ambulatory Visit: Admitting: Neurology
